# Patient Record
Sex: Male | Born: 1950 | Race: White | Hispanic: No | Marital: Single | Smoking: Current every day smoker
Health system: Southern US, Community
[De-identification: ages and names within clinical notes are randomized; demographics above are authoritative.]

## PROBLEM LIST (undated history)

## (undated) DIAGNOSIS — I739 Peripheral vascular disease, unspecified: Secondary | ICD-10-CM

## (undated) DIAGNOSIS — I10 Essential (primary) hypertension: Secondary | ICD-10-CM

## (undated) DIAGNOSIS — S2249XA Multiple fractures of ribs, unspecified side, initial encounter for closed fracture: Secondary | ICD-10-CM

## (undated) DIAGNOSIS — R55 Syncope and collapse: Secondary | ICD-10-CM

## (undated) DIAGNOSIS — K029 Dental caries, unspecified: Secondary | ICD-10-CM

## (undated) DIAGNOSIS — I480 Paroxysmal atrial fibrillation: Secondary | ICD-10-CM

## (undated) DIAGNOSIS — Z720 Tobacco use: Secondary | ICD-10-CM

## (undated) DIAGNOSIS — R911 Solitary pulmonary nodule: Secondary | ICD-10-CM

## (undated) DIAGNOSIS — R9389 Abnormal findings on diagnostic imaging of other specified body structures: Secondary | ICD-10-CM

## (undated) DIAGNOSIS — R569 Unspecified convulsions: Secondary | ICD-10-CM

## (undated) DIAGNOSIS — J329 Chronic sinusitis, unspecified: Secondary | ICD-10-CM

## (undated) HISTORY — DX: Peripheral vascular disease, unspecified: I73.9

## (undated) HISTORY — DX: Paroxysmal atrial fibrillation: I48.0

## (undated) HISTORY — DX: Essential (primary) hypertension: I10

---
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
## 2006-06-29 ENCOUNTER — Ambulatory Visit (HOSPITAL_COMMUNITY): Admission: RE | Admit: 2006-06-29 | Discharge: 2006-07-01 | Payer: Self-pay | Admitting: Urology

## 2016-06-09 ENCOUNTER — Encounter (HOSPITAL_COMMUNITY): Payer: Self-pay | Admitting: Emergency Medicine

## 2016-06-09 ENCOUNTER — Emergency Department (HOSPITAL_COMMUNITY): Payer: Medicaid Other

## 2016-06-09 ENCOUNTER — Inpatient Hospital Stay (HOSPITAL_COMMUNITY)
Admission: EM | Admit: 2016-06-09 | Discharge: 2016-06-15 | DRG: 243 | Disposition: A | Discharge status: Home / Self Care | Payer: Medicaid Other | Attending: General Surgery | Admitting: General Surgery

## 2016-06-09 DIAGNOSIS — S2241XA Multiple fractures of ribs, right side, initial encounter for closed fracture: Secondary | ICD-10-CM

## 2016-06-09 DIAGNOSIS — Z950 Presence of cardiac pacemaker: Secondary | ICD-10-CM

## 2016-06-09 DIAGNOSIS — W109XXA Fall (on) (from) unspecified stairs and steps, initial encounter: Secondary | ICD-10-CM | POA: Diagnosis present

## 2016-06-09 DIAGNOSIS — J0100 Acute maxillary sinusitis, unspecified: Secondary | ICD-10-CM

## 2016-06-09 DIAGNOSIS — I10 Essential (primary) hypertension: Secondary | ICD-10-CM | POA: Diagnosis present

## 2016-06-09 DIAGNOSIS — J329 Chronic sinusitis, unspecified: Secondary | ICD-10-CM | POA: Diagnosis present

## 2016-06-09 DIAGNOSIS — I495 Sick sinus syndrome: Principal | ICD-10-CM | POA: Diagnosis present

## 2016-06-09 DIAGNOSIS — D72829 Elevated white blood cell count, unspecified: Secondary | ICD-10-CM | POA: Diagnosis present

## 2016-06-09 DIAGNOSIS — F17210 Nicotine dependence, cigarettes, uncomplicated: Secondary | ICD-10-CM | POA: Diagnosis present

## 2016-06-09 DIAGNOSIS — K029 Dental caries, unspecified: Secondary | ICD-10-CM | POA: Diagnosis present

## 2016-06-09 DIAGNOSIS — R911 Solitary pulmonary nodule: Secondary | ICD-10-CM | POA: Diagnosis present

## 2016-06-09 DIAGNOSIS — I739 Peripheral vascular disease, unspecified: Secondary | ICD-10-CM | POA: Diagnosis not present

## 2016-06-09 DIAGNOSIS — Z720 Tobacco use: Secondary | ICD-10-CM

## 2016-06-09 DIAGNOSIS — I480 Paroxysmal atrial fibrillation: Secondary | ICD-10-CM | POA: Diagnosis present

## 2016-06-09 DIAGNOSIS — R938 Abnormal findings on diagnostic imaging of other specified body structures: Secondary | ICD-10-CM | POA: Diagnosis not present

## 2016-06-09 DIAGNOSIS — R55 Syncope and collapse: Secondary | ICD-10-CM | POA: Diagnosis present

## 2016-06-09 DIAGNOSIS — J449 Chronic obstructive pulmonary disease, unspecified: Secondary | ICD-10-CM | POA: Diagnosis present

## 2016-06-09 DIAGNOSIS — I472 Ventricular tachycardia: Secondary | ICD-10-CM | POA: Diagnosis not present

## 2016-06-09 DIAGNOSIS — W19XXXA Unspecified fall, initial encounter: Secondary | ICD-10-CM

## 2016-06-09 DIAGNOSIS — S299XXA Unspecified injury of thorax, initial encounter: Secondary | ICD-10-CM

## 2016-06-09 DIAGNOSIS — R9389 Abnormal findings on diagnostic imaging of other specified body structures: Secondary | ICD-10-CM | POA: Diagnosis present

## 2016-06-09 DIAGNOSIS — I459 Conduction disorder, unspecified: Secondary | ICD-10-CM | POA: Diagnosis not present

## 2016-06-09 HISTORY — DX: Multiple fractures of ribs, unspecified side, initial encounter for closed fracture: S22.49XA

## 2016-06-09 HISTORY — DX: Solitary pulmonary nodule: R91.1

## 2016-06-09 HISTORY — DX: Abnormal findings on diagnostic imaging of other specified body structures: R93.89

## 2016-06-09 HISTORY — DX: Syncope and collapse: R55

## 2016-06-09 HISTORY — DX: Chronic sinusitis, unspecified: J32.9

## 2016-06-09 HISTORY — DX: Dental caries, unspecified: K02.9

## 2016-06-09 HISTORY — DX: Tobacco use: Z72.0

## 2016-06-09 LAB — CBC
HCT: 42.9 % (ref 39.0–52.0)
HEMOGLOBIN: 14.7 g/dL (ref 13.0–17.0)
MCH: 33.3 pg (ref 26.0–34.0)
MCHC: 34.3 g/dL (ref 30.0–36.0)
MCV: 97.1 fL (ref 78.0–100.0)
Platelets: 172 10*3/uL (ref 150–400)
RBC: 4.42 MIL/uL (ref 4.22–5.81)
RDW: 13 % (ref 11.5–15.5)
WBC: 12.6 10*3/uL — ABNORMAL HIGH (ref 4.0–10.5)

## 2016-06-09 LAB — COMPREHENSIVE METABOLIC PANEL
ALBUMIN: 3.4 g/dL — AB (ref 3.5–5.0)
ALK PHOS: 89 U/L (ref 38–126)
ALT: 7 U/L — ABNORMAL LOW (ref 17–63)
AST: 19 U/L (ref 15–41)
Anion gap: 7 (ref 5–15)
BUN: 11 mg/dL (ref 6–20)
CHLORIDE: 102 mmol/L (ref 101–111)
CO2: 25 mmol/L (ref 22–32)
CREATININE: 0.97 mg/dL (ref 0.61–1.24)
Calcium: 8.8 mg/dL — ABNORMAL LOW (ref 8.9–10.3)
GFR calc Af Amer: >60 mL/min (ref >60)
GFR calc non Af Amer: >60 mL/min (ref >60)
Glucose, Bld: 91 mg/dL (ref 65–99)
Potassium: 4 mmol/L (ref 3.5–5.1)
Sodium: 134 mmol/L — ABNORMAL LOW (ref 135–145)
Total Bilirubin: 0.9 mg/dL (ref 0.3–1.2)
Total Protein: 6.3 g/dL — ABNORMAL LOW (ref 6.5–8.1)

## 2016-06-09 LAB — TROPONIN I

## 2016-06-09 LAB — I-STAT TROPONIN, ED
Troponin i, poc: 0 ng/mL (ref 0.00–0.08)
Troponin i, poc: 0.01 ng/mL (ref 0.00–0.08)
Troponin i, poc: 0 ng/mL (ref 0.00–0.08)

## 2016-06-09 LAB — MRSA PCR SCREENING: MRSA by PCR: NEGATIVE

## 2016-06-09 LAB — TSH: TSH: 0.506 u[IU]/mL (ref 0.350–4.500)

## 2016-06-09 MED ORDER — IPRATROPIUM-ALBUTEROL 0.5-2.5 (3) MG/3ML IN SOLN
3.0000 mL | Freq: Four times a day (QID) | RESPIRATORY_TRACT | Status: DC
  Administered 2016-06-10 (×2): 3 mL via RESPIRATORY_TRACT
  Filled 2016-06-09 (×2): qty 3

## 2016-06-09 MED ORDER — HYDRALAZINE HCL 20 MG/ML IJ SOLN: 5.0000 mg | INTRAMUSCULAR | Status: DC | PRN

## 2016-06-09 MED ORDER — OXYCODONE HCL 5 MG PO TABS
5.0000 mg | ORAL_TABLET | ORAL | Status: DC | PRN
  Administered 2016-06-09: 10 mg via ORAL
  Administered 2016-06-10: 15 mg via ORAL
  Administered 2016-06-10 – 2016-06-11 (×3): 10 mg via ORAL
  Filled 2016-06-09 (×5): qty 2
  Filled 2016-06-09: qty 3

## 2016-06-09 MED ORDER — ENOXAPARIN SODIUM 40 MG/0.4ML ~~LOC~~ SOLN
40.0000 mg | SUBCUTANEOUS | Status: DC
  Administered 2016-06-10 – 2016-06-13 (×4): 40 mg via SUBCUTANEOUS
  Filled 2016-06-09 (×4): qty 0.4

## 2016-06-09 MED ORDER — METOPROLOL TARTRATE 5 MG/5ML IV SOLN
10.0000 mg | Freq: Once | INTRAVENOUS | Status: AC
  Administered 2016-06-09: 10 mg via INTRAVENOUS
  Filled 2016-06-09: qty 10

## 2016-06-09 MED ORDER — ONDANSETRON HCL 4 MG/2ML IJ SOLN: 4.0000 mg | Freq: Four times a day (QID) | INTRAMUSCULAR | Status: DC | PRN

## 2016-06-09 MED ORDER — HYDROMORPHONE HCL 1 MG/ML IJ SOLN
1.0000 mg | INTRAMUSCULAR | Status: DC | PRN
  Administered 2016-06-09 (×2): 1 mg via INTRAVENOUS
  Filled 2016-06-09 (×3): qty 1

## 2016-06-09 MED ORDER — ACETAMINOPHEN 325 MG PO TABS: 650.0000 mg | ORAL_TABLET | ORAL | Status: DC | PRN

## 2016-06-09 MED ORDER — METHOCARBAMOL 1000 MG/10ML IJ SOLN
1000.0000 mg | Freq: Three times a day (TID) | INTRAVENOUS | Status: DC | PRN
  Filled 2016-06-09: qty 10

## 2016-06-09 MED ORDER — PANTOPRAZOLE SODIUM 40 MG IV SOLR: 40.0000 mg | Freq: Every day | INTRAVENOUS | Status: DC

## 2016-06-09 MED ORDER — KCL IN DEXTROSE-NACL 20-5-0.45 MEQ/L-%-% IV SOLN
INTRAVENOUS | Status: DC
  Administered 2016-06-09 – 2016-06-14 (×5): via INTRAVENOUS
  Filled 2016-06-09 (×7): qty 1000

## 2016-06-09 MED ORDER — IOPAMIDOL (ISOVUE-300) INJECTION 61%
INTRAVENOUS | Status: AC
  Administered 2016-06-09: 100 mL
  Filled 2016-06-09: qty 100

## 2016-06-09 MED ORDER — MORPHINE SULFATE (PF) 4 MG/ML IV SOLN
4.0000 mg | Freq: Once | INTRAVENOUS | Status: AC
  Administered 2016-06-09: 4 mg via INTRAVENOUS
  Filled 2016-06-09: qty 1

## 2016-06-09 MED ORDER — AMOXICILLIN-POT CLAVULANATE 875-125 MG PO TABS
1.0000 | ORAL_TABLET | Freq: Two times a day (BID) | ORAL | Status: DC
  Administered 2016-06-09 – 2016-06-15 (×13): 1 via ORAL
  Filled 2016-06-09 (×13): qty 1

## 2016-06-09 MED ORDER — TETANUS-DIPHTH-ACELL PERTUSSIS 5-2.5-18.5 LF-MCG/0.5 IM SUSP
0.5000 mL | Freq: Once | INTRAMUSCULAR | Status: AC
  Administered 2016-06-09: 0.5 mL via INTRAMUSCULAR
  Filled 2016-06-09: qty 0.5

## 2016-06-09 MED ORDER — SODIUM CHLORIDE 0.9% FLUSH
3.0000 mL | Freq: Two times a day (BID) | INTRAVENOUS | Status: DC
  Administered 2016-06-09 – 2016-06-14 (×8): 3 mL via INTRAVENOUS

## 2016-06-09 MED ORDER — PANTOPRAZOLE SODIUM 40 MG PO TBEC
40.0000 mg | DELAYED_RELEASE_TABLET | Freq: Every day | ORAL | Status: DC
  Administered 2016-06-09 – 2016-06-15 (×7): 40 mg via ORAL
  Filled 2016-06-09 (×7): qty 1

## 2016-06-09 MED ORDER — ONDANSETRON HCL 4 MG PO TABS
4.0000 mg | ORAL_TABLET | Freq: Four times a day (QID) | ORAL | Status: DC | PRN
  Administered 2016-06-09: 4 mg via ORAL
  Filled 2016-06-09: qty 1

## 2016-06-09 NOTE — ED Notes (Signed)
This Rn spoke with Dr Janee Morn and he gave verbal order to give  of lopressor

## 2016-06-09 NOTE — ED Triage Notes (Signed)
Pt here after passing out at the top of his steps and falling down approx 12 steps , positive loc , pt has pain to the right rear flank , aslo swelling to the nose

## 2016-06-09 NOTE — ED Notes (Signed)
Dr Janee Morn paged to inform of atrial fibrillation

## 2016-06-09 NOTE — Consult Note (Signed)
[facility] Medical Consultation  MOHID FURUYA ZOX:[id number] DOB: January 30, 1951 DOA: 06/09/2016 PCP: No PCP Per Patient   Requesting physician: Laurell Josephs trauma Date of consultation: 06/09/16 Reason for consultation: syncope  Impression/Recommendations Principal Problem:   Syncope Active Problems:   Multiple fractures of ribs, right side, initial encounter for closed fracture   Sinusitis   Pulmonary nodule   Tobacco use   Abnormal CT of the chest   Dental caries  1. Syncope. Etiology uncertain. Ct of the head no acute abnormality. EKG with normal sinus rhythm. Maxillary CT reveals a sinusitis. Troponin negative. No metabolic derangements. She denies taking any over-the-counter drugs. -Monitor on telemetry for any arrhythmias -Obtain a 2-D echo -Obtain urinalysis -Urine drug screen -TSH -Obtain a CK -orthostatic vital signs  #2. Sinusitis. Per CT as noted above. Patient denies headache fever chills. Mild leukocytosis. Patient is afebrile and nontoxic appearing -augmenten  #3. Pulmonary nodule right lung per chest CT. Patient long-term smoking history. Oxygen saturation level greater than 90% on room air. Suspect patient has some underlying COPD but he does not have PCP -Outpatient follow-up 6-12 months -Case management to assist with establishing PCP  #4. Abnormal chest CT. ET of the chest with emphysematous changes. Long term smoker.  -smoking cesation -OP follow up  5. Tobacco use. -Cessation counseling  6.dental caries. No dental care -case management assist with dental follow up  #7. Multiple fractures of ribs right side -Per trauma team   TRH followup again tomorrow. Thank you for this consultation.  Chief Complaint: syncope  HPI:  Patrick Kelly is a very pleasant 66 year old with no medical history according to him presents to emergency department after a fall down 14 concrete steps really is result of syncopal episode. Patient remembers getting ready for  work taken his coffee in his apartment standing at the top of the stairs. His next memory is waking up at the bottom of the stairs noticing blood. He believes he was lying there for almost an hour. He noticed his cell phone was lying midway up the stairs and was able to crawl to his cell phone. He reports being in his usual state of health prior to the fall. He denies chest pain palpitations shortness of breath lower extremity edema or orthopnea. He denies any recent illness fever chills headache dizziness visual disturbances. He denies any cough nasal congestion shortness of breath abdominal pain nausea vomiting diarrhea. He denies melena bright red blood per rectum. He reports eating and drinking his normal amount and denies any unintentional weight loss.  Review of Systems:  Point review of systems complete and all systems are negative except as indicated in the history of present illness  Past Medical History:  Diagnosis Date  . Abnormal CT of the chest    emphysemetous changes suggestive copd  . Dental caries   . Multiple fractures of ribs   . Pulmonary nodule   . Sinusitis   . Syncope   . Tobacco use    History reviewed. No pertinent surgical history. Social History:  reports that he has been smoking.  He has never used smokeless tobacco. He reports that he does not drink alcohol or use drugs.  No Known Allergies Family History  Problem Relation Age of Onset  . Hypertension Sister     Patient has a sister is at the bedside reports family medical history of hypertension. Unable to provide further family medical history at this time Prior to Admission medications   Not on File  Physical Exam: Blood pressure 115/79, pulse 80, temperature 97.6 F (36.4 C), temperature source Oral, resp. rate (!) 21, SpO2 96 %. Vitals:   06/09/16 1545 06/09/16 1600  BP: 128/84 115/79  Pulse: 79 80  Resp: (!) 23 (!) 21  Temp:       General:  Lying in bed well-nourished contusions on forehead  nose bilateral hands arms  Eyes: Pupils equal round reactive to light EOMI no scleral icterus  ENT: Ears clear nose without drainage oropharynx without erythema or exudate. Very poor dentition  Neck: Supple no JVD full range of motion no lymphadenopathy  Cardiovascular: Rate and rhythm no murmur gallop or rub no lower extremity edema chest tender to palpation particularly on the right  Respiratory: Normal effort breath sounds are distant slightly coarse a hear no wheeze no crackles  Abdomen: Nondistended nontender positive bowel sounds no guarding or rebounding  Skin: Abrasions noted, right dorsal MCP joint right hand left wrist nose for head  Musculoskeletal: Joints without swelling/erythema. Decreased range of motion to thoracic spine normal muscle tone  Psychiatric: Calm cooperative  Neurologic: Alert and oriented 3 speech clear facial symmetry  Labs on Admission:  Basic Metabolic Panel:  Recent Labs Lab 06/09/16 1156  NA 134*  K 4.0  CL 102  CO2 25  GLUCOSE 91  BUN 11  CREATININE 0.97  CALCIUM 8.8*   Liver Function Tests:  Recent Labs Lab 06/09/16 1156  AST 19  ALT 7*  ALKPHOS 89  BILITOT 0.9  PROT 6.3*  ALBUMIN 3.4*   No results for input(s): LIPASE, AMYLASE in the last 168 hours. No results for input(s): AMMONIA in the last 168 hours. CBC:  Recent Labs Lab 06/09/16 1156  WBC 12.6*  HGB 14.7  HCT 42.9  MCV 97.1  PLT 172   Cardiac Enzymes: No results for input(s): CKTOTAL, CKMB, CKMBINDEX, TROPONINI in the last 168 hours. BNP: Invalid input(s): POCBNP CBG: No results for input(s): GLUCAP in the last 168 hours.  Radiological Exams on Admission: Ct Head Wo Contrast  Result Date: 06/09/2016 CLINICAL DATA:  Fall down stairs. Forehead abrasion, bloody nose, missing teeth. Initial encounter. EXAM: CT HEAD WITHOUT CONTRAST CT MAXILLOFACIAL WITHOUT CONTRAST CT CERVICAL SPINE WITHOUT CONTRAST TECHNIQUE: Multidetector CT imaging of the head, cervical  spine, and maxillofacial structures were performed using the standard protocol without intravenous contrast. Multiplanar CT image reconstructions of the cervical spine and maxillofacial structures were also generated. COMPARISON:  None. FINDINGS: CT HEAD FINDINGS Brain: No evidence of acute infarction, hemorrhage, hydrocephalus, extra-axial collection or mass lesion/mass effect. Vascular: No hyperdense vessel or unexpected calcification. Skull: Normal. Negative for fracture or focal lesion. CT MAXILLOFACIAL FINDINGS Osseous: Remote left superior orbital rim repair. No acute fracture. Very poor dentition with multiple devitalized teeth and periapical erosions. There is extensive loss of the alveolar ridge involving the mandible. Orbits: No posttraumatic finding. Sinuses: Fluid levels in the right frontal sinus. Both maxillary sinuses are atelectatic and partially opacified. Soft tissues: No opaque foreign body or hematoma. CT CERVICAL SPINE FINDINGS Alignment: No traumatic malalignment. Skull base and vertebrae: Negative for acute fracture. Soft tissues and spinal canal: No prevertebral fluid or swelling. No visible canal hematoma. Disc levels: C5-6 and C6-7 uncovertebral spurring and foraminal narrowing. Upper chest: Biapical emphysema. IMPRESSION: 1. No evidence of intracranial or cervical spine injury. 2. Negative for facial fracture. 3. 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 A superimposed tooth fracture would not be readily detectable. 4. Sinusitis. Electronically Signed   By: Marja Kays  Watts M.D.   On: 06/09/2016 13:54   Ct Chest W Contrast  Result Date: 06/09/2016 CLINICAL DATA:  Larey Seat down stairs. EXAM: CT CHEST, ABDOMEN, AND PELVIS WITH CONTRAST TECHNIQUE: Multidetector CT imaging of the chest, abdomen and pelvis was performed following the standard protocol during bolus administration of intravenous contrast. CONTRAST:  ISOVUE-300 IOPAMIDOL (ISOVUE-300) INJECTION 61%  COMPARISON:  None. FINDINGS: CT CHEST FINDINGS Cardiovascular: Normal heart size. No pericardial effusion. The thoracic aorta appears intact. Mediastinum/Nodes: The trachea appears patent and is midline. Normal appearance of the esophagus. Prominent mediastinal lymph nodes identified. Left paratracheal node measures 8 mm. 1.3 cm right hilar lymph node is identified. The 1 cm sub- carinal lymph node identified. Lungs/Pleura: No pleural effusion. Moderate to advanced changes of centrilobular and paraseptal emphysema. No pneumothorax or pulmonary contusion. 6 mm anteromedial right upper lobe lung nodule is identified, image 40 of series 203. Dependent changes and subsegmental atelectasis noted in the lung bases. Musculoskeletal: There are acute nondisplaced right sixth through tenth rib fractures. No acute left rib fractures. The sternum appears intact. There is a fracture deformity involving the T4 vertebra nondisplaced scratch set mild nondisplaced fracture involving the inferior endplate of T4 is suspected. CT ABDOMEN PELVIS FINDINGS Hepatobiliary: Hemangioma within the posterior right lobe of liver measures 3.6 cm. Within segment 6 there is a larger hemangioma measuring 3.6 cm. The liver is otherwise intact without evidence for rupture. Stones identified within the neck of gallbladder. No biliary dilatation. Pancreas: Unremarkable. No pancreatic ductal dilatation or surrounding inflammatory changes. Spleen: Normal in size without focal abnormality. Adrenals/Urinary Tract: Adreniform enlargement of bilateral adrenal glands. No hemorrhage. The kidneys appear intact. Right kidney cyst noted. Several cystic structures are identified within the left side of pelvis. Favor large bladder diverticula. Urinary bladder otherwise appears normal. Stomach/Bowel: The stomach is normal. The small bowel loops have a normal course and caliber. The appendix is visualized and appears normal. No pathologic dilatation of the colon.  Vascular/Lymphatic: Aortic atherosclerosis. No upper abdominal adenopathy. No pelvic or inguinal adenopathy. Reproductive:   Prostate gland is enlarged. Other: No free fluid or fluid collections. Musculoskeletal: Sclerotic lesion in the right iliac wing is favored to represent a benign bone island. 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 IMPRESSION: 1. Multiple acute right rib fracture deformities. There is also an age indeterminate T4 fracture deformity. 2. No evidence for solid organ injury within the abdomen or pelvis. 3. Diffuse bronchial wall thickening with emphysema, as above; imaging findings suggestive of underlying COPD. 4. Pulmonary nodule in the right lung measures 6 mm. Non-contrast chest CT at 6-12 months is recommended. If the nodule is stable at time of repeat CT, then future CT at 18-24 months (from today's scan) is considered optional for low-risk patients, but is recommended for high-risk patients. This recommendation follows the consensus statement: Guidelines for Management of Incidental Pulmonary Nodules Detected on CT Images:From the Fleischner Society 2017; published online before print (10.1148/radiol.[id number]). 5. Liver hemangiomas. 6. Gallstones 7. Prostate gland enlargement and bladder diverticula. Electronically Signed   By: Signa Kell M.D.   On: 06/09/2016 13:53   Ct Cervical Spine Wo Contrast  Result Date: 06/09/2016 CLINICAL DATA:  Fall down stairs. Forehead abrasion, bloody nose, missing teeth. Initial encounter. EXAM: CT HEAD WITHOUT CONTRAST CT MAXILLOFACIAL WITHOUT CONTRAST CT CERVICAL SPINE WITHOUT CONTRAST TECHNIQUE: Multidetector CT imaging of the head, cervical spine, and maxillofacial structures were performed using the standard protocol without intravenous contrast.  Multiplanar CT image reconstructions of the cervical spine and maxillofacial structures were also generated. COMPARISON:  None. FINDINGS: CT  HEAD FINDINGS Brain: No evidence of acute infarction, hemorrhage, hydrocephalus, extra-axial collection or mass lesion/mass effect. Vascular: No hyperdense vessel or unexpected calcification. Skull: Normal. Negative for fracture or focal lesion. CT MAXILLOFACIAL FINDINGS Osseous: Remote left superior orbital rim repair. No acute fracture. Very poor dentition with multiple devitalized teeth and periapical erosions. There is extensive loss of the alveolar ridge involving the mandible. Orbits: No posttraumatic finding. Sinuses: Fluid levels in the right frontal sinus. Both maxillary sinuses are atelectatic and partially opacified. Soft tissues: No opaque foreign body or hematoma. CT CERVICAL SPINE FINDINGS Alignment: No traumatic malalignment. Skull base and vertebrae: Negative for acute fracture. Soft tissues and spinal canal: No prevertebral fluid or swelling. No visible canal hematoma. Disc levels: C5-6 and C6-7 uncovertebral spurring and foraminal narrowing. Upper chest: Biapical emphysema. IMPRESSION: 1. No evidence of intracranial or cervical spine injury. 2. Negative for facial fracture. 3. 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 A superimposed tooth fracture would not be readily detectable. 4. Sinusitis. Electronically Signed   By: Marnee Spring M.D.   On: 06/09/2016 13:54   Ct Abdomen Pelvis W Contrast  Result Date: 06/09/2016 CLINICAL DATA:  Larey Seat down stairs. EXAM: CT CHEST, ABDOMEN, AND PELVIS WITH CONTRAST TECHNIQUE: Multidetector CT imaging of the chest, abdomen and pelvis was performed following the standard protocol during bolus administration of intravenous contrast. CONTRAST:  ISOVUE-300 IOPAMIDOL (ISOVUE-300) INJECTION 61% COMPARISON:  None. FINDINGS: CT CHEST FINDINGS Cardiovascular: Normal heart size. No pericardial effusion. The thoracic aorta appears intact. Mediastinum/Nodes: The trachea appears patent and is midline. Normal appearance of the esophagus. Prominent  mediastinal lymph nodes identified. Left paratracheal node measures 8 mm. 1.3 cm right hilar lymph node is identified. The 1 cm sub- carinal lymph node identified. Lungs/Pleura: No pleural effusion. Moderate to advanced changes of centrilobular and paraseptal emphysema. No pneumothorax or pulmonary contusion. 6 mm anteromedial right upper lobe lung nodule is identified, image 40 of series 203. Dependent changes and subsegmental atelectasis noted in the lung bases. Musculoskeletal: There are acute nondisplaced right sixth through tenth rib fractures. No acute left rib fractures. The sternum appears intact. There is a fracture deformity involving the T4 vertebra nondisplaced scratch set mild nondisplaced fracture involving the inferior endplate of T4 is suspected. CT ABDOMEN PELVIS FINDINGS Hepatobiliary: Hemangioma within the posterior right lobe of liver measures 3.6 cm. Within segment 6 there is a larger hemangioma measuring 3.6 cm. The liver is otherwise intact without evidence for rupture. Stones identified within the neck of gallbladder. No biliary dilatation. Pancreas: Unremarkable. No pancreatic ductal dilatation or surrounding inflammatory changes. Spleen: Normal in size without focal abnormality. Adrenals/Urinary Tract: Adreniform enlargement of bilateral adrenal glands. No hemorrhage. The kidneys appear intact. Right kidney cyst noted. Several cystic structures are identified within the left side of pelvis. Favor large bladder diverticula. Urinary bladder otherwise appears normal. Stomach/Bowel: The stomach is normal. The small bowel loops have a normal course and caliber. The appendix is visualized and appears normal. No pathologic dilatation of the colon. Vascular/Lymphatic: Aortic atherosclerosis. No upper abdominal adenopathy. No pelvic or inguinal adenopathy. Reproductive:   Prostate gland is enlarged. Other: No free fluid or fluid collections. Musculoskeletal: Sclerotic lesion in the right iliac wing  is favored to represent a benign bone island. 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 IMPRESSION: 1. Multiple acute right  rib fracture deformities. There is also an age indeterminate T4 fracture deformity. 2. No evidence for solid organ injury within the abdomen or pelvis. 3. Diffuse bronchial wall thickening with emphysema, as above; imaging findings suggestive of underlying COPD. 4. Pulmonary nodule in the right lung measures 6 mm. Non-contrast chest CT at 6-12 months is recommended. If the nodule is stable at time of repeat CT, then future CT at 18-24 months (from today's scan) is considered optional for low-risk patients, but is recommended for high-risk patients. This recommendation follows the consensus statement: Guidelines for Management of Incidental Pulmonary Nodules Detected on CT Images:From the Fleischner Society 2017; published online before print (10.1148/radiol.[id number]). 5. Liver hemangiomas. 6. Gallstones 7. Prostate gland enlargement and bladder diverticula. Electronically Signed   By: Signa Kell M.D.   On: 06/09/2016 13:53   Ct Maxillofacial Wo Contrast  Result Date: 06/09/2016 CLINICAL DATA:  Fall down stairs. Forehead abrasion, bloody nose, missing teeth. Initial encounter. EXAM: CT HEAD WITHOUT CONTRAST CT MAXILLOFACIAL WITHOUT CONTRAST CT CERVICAL SPINE WITHOUT CONTRAST TECHNIQUE: Multidetector CT imaging of the head, cervical spine, and maxillofacial structures were performed using the standard protocol without intravenous contrast. Multiplanar CT image reconstructions of the cervical spine and maxillofacial structures were also generated. COMPARISON:  None. FINDINGS: CT HEAD FINDINGS Brain: No evidence of acute infarction, hemorrhage, hydrocephalus, extra-axial collection or mass lesion/mass effect. Vascular: No hyperdense vessel or unexpected calcification. Skull: Normal. Negative for fracture or focal lesion. CT  MAXILLOFACIAL FINDINGS Osseous: Remote left superior orbital rim repair. No acute fracture. Very poor dentition with multiple devitalized teeth and periapical erosions. There is extensive loss of the alveolar ridge involving the mandible. Orbits: No posttraumatic finding. Sinuses: Fluid levels in the right frontal sinus. Both maxillary sinuses are atelectatic and partially opacified. Soft tissues: No opaque foreign body or hematoma. CT CERVICAL SPINE FINDINGS Alignment: No traumatic malalignment. Skull base and vertebrae: Negative for acute fracture. Soft tissues and spinal canal: No prevertebral fluid or swelling. No visible canal hematoma. Disc levels: C5-6 and C6-7 uncovertebral spurring and foraminal narrowing. Upper chest: Biapical emphysema. IMPRESSION: 1. No evidence of intracranial or cervical spine injury. 2. Negative for facial fracture. 3. 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 A superimposed tooth fracture would not be readily detectable. 4. Sinusitis. Electronically Signed   By: Marnee Spring M.D.   On: 06/09/2016 13:54    EKG: Independently reviewed. Sinus rhythm Borderline ST elevation, inferior leads  Time spent: 55 minutes  [place] M [facility]   If 7PM-7AM, please contact night-coverage [email or web address] Password [email or web address] 06/09/2016, 4:31 PM

## 2016-06-09 NOTE — H&P (Signed)
[facility] Surgery Admission Note  Patrick Kelly 07-09-1950  [id number].    Requesting MD:  Chief Complaint/Reason for Consult: Fractured ribs after fall   HPI:   Patrick Kelly is a 66 year old male with no significant PMHx pack a day smoker who was brought to the ED via EMS following a possible syncopal episode resulting in a fall down the 13 concrete stairs of his apartment. His sister is present throughout the interview.  The patient states that he fell after exiting his apartment but before he was able to lock the deadbolt on his door. He notes that he does not remember the fall and did suffer a LOC. He awoke at the bottom of the stairs roughly 1 hour later and proceeded to crawl half way up the stairs to his phone at which time his coworker called inquiring his whereabouts. His coworkers and EMS arrived on the scene and proceeded to bring him to the [facility] ED. The patient states that when he awoke he was covered in blood. He states that he was in extreme pain on the right side of his chest since he woke. Pain is 2/10 at rest and severe and worse with movement, non radiating, sharp.   The patient states that he was having a normal morning, did not feel dizzy, weak, numbness, tingling, SOB, or notice a headache before the event. He did not eat breakfast, but states that this is not different from any other morning. He states that he does not have a history of falling and has been very healthy prior to the fall. He does not take any medications. Denies any cardiac history.  ROS: Review of Systems  Respiratory: Negative for shortness of breath.   Cardiovascular: Negative for chest pain.  Gastrointestinal: Negative for diarrhea, nausea and vomiting.  Musculoskeletal: Positive for falls. Negative for back pain and neck pain.       Positive for right rib pain  Neurological: Positive for loss of consciousness. Negative for dizziness, tingling and headaches.  Psychiatric/Behavioral:  Negative for memory loss.  All other systems reviewed and are negative.   History reviewed. Father died in 32's from MI and brother has heart disease  History reviewed. No pertinent past medical history.  History reviewed. No pertinent surgical history.  Social History:  reports that he has been smoking.  He has never used smokeless tobacco. He reports that he does not drink alcohol or use drugs.  Allergies: No Known Allergies   (Not in a hospital admission)  Blood pressure 112/90, pulse 89, temperature 97.6 F (36.4 C), temperature source Oral, resp. rate 20, SpO2 94 % on 2L Physical Exam: Physical Exam  Constitutional: He is oriented to person, place, and time. He appears well-developed and well-nourished. He is cooperative.  in moderate discomfort, laying flat in ED bed  HENT:  Head: Normocephalic. Head is with abrasion (on mid upper forehead).    Nose: Nose normal. No sinus tenderness or nasal deformity.  Mouth/Throat: Mucous membranes are not pale and dry. Abnormal dentition. Lacerations and dental caries present.  Dried blood, no signs of trauma Mouth: Laceration located on internal upper lip, poor dentition.  Eyes: Conjunctivae are normal. Pupils are equal, round, and reactive to light.  Neck: Normal range of motion and full passive range of motion without pain. No spinous process tenderness and no muscular tenderness present. No tracheal deviation and normal range of motion present.  Cardiovascular: Normal rate, regular rhythm and intact distal pulses.  Exam reveals  no gallop and no friction rub.   No murmur heard. Pulses:      Radial pulses are 2+ on the right side, and 2+ on the left side.       Dorsalis pedis pulses are 2+ on the right side, and 2+ on the left side.  Pulmonary/Chest: Effort normal and breath sounds normal. No respiratory distress. He has no decreased breath sounds. He has no wheezes. He has no rales. He exhibits tenderness.  Abdominal: Soft. Normal  appearance and bowel sounds are normal. There is no hepatosplenomegaly. There is no tenderness. No hernia.  Musculoskeletal: Normal range of motion. He exhibits no edema, tenderness or deformity.  No TTP to spinous processes or paraspinal muscles or C, T and L spine  Abrasion noted to right dorsal MCP joint, and multiple abrasions noted to dorsal aspect of left hand  Neurological: He is alert and oriented to person, place, and time. No cranial nerve deficit. GCS eye subscore is 4. GCS verbal subscore is 5. GCS motor subscore is 6.  Skin: Skin is warm and dry.  No ecchymosis noted over right ribs.  Psychiatric: He has a normal mood and affect. His speech is normal and behavior is normal.    Results for orders placed or performed during the hospital encounter of 06/09/16 (from the past 48 hour(s))  CBC     Status: Abnormal   Collection Time: 06/09/16 11:56 AM  Result Value Ref Range   WBC 12.6 (H) 4.0 - 10.5 K/uL   RBC 4.42 4.22 - 5.81 MIL/uL   Hemoglobin 14.7 13.0 - 17.0 g/dL   HCT 42.9 39.0 - 52.0 %   MCV 97.1 78.0 - 100.0 fL   MCH 33.3 26.0 - 34.0 pg   MCHC 34.3 30.0 - 36.0 g/dL   RDW 13.0 11.5 - 15.5 %   Platelets 172 150 - 400 K/uL  Comprehensive metabolic panel     Status: Abnormal   Collection Time: 06/09/16 11:56 AM  Result Value Ref Range   Sodium 134 (L) 135 - 145 mmol/L   Potassium 4.0 3.5 - 5.1 mmol/L   Chloride 102 101 - 111 mmol/L   CO2 25 22 - 32 mmol/L   Glucose, Bld 91 65 - 99 mg/dL   BUN 11 6 - 20 mg/dL   Creatinine, Ser 0.97 0.61 - 1.24 mg/dL   Calcium 8.8 (L) 8.9 - 10.3 mg/dL   Total Protein 6.3 (L) 6.5 - 8.1 g/dL   Albumin 3.4 (L) 3.5 - 5.0 g/dL   AST 19 15 - 41 U/L   ALT 7 (L) 17 - 63 U/L   Alkaline Phosphatase 89 38 - 126 U/L   Total Bilirubin 0.9 0.3 - 1.2 mg/dL   GFR calc non Af Amer >60 >60 mL/min   GFR calc Af Amer >60 >60 mL/min    Comment: (NOTE) The eGFR has been calculated using the CKD EPI equation. This calculation has not been validated in  all clinical situations. eGFR's persistently <60 mL/min signify possible Chronic Kidney Disease.    Anion gap 7 5 - 15  I-Stat Troponin, ED - 0, 3, 6 hours (not at [facility])     Status: None   Collection Time: 06/09/16 12:15 PM  Result Value Ref Range   Troponin i, poc 0.00 0.00 - 0.08 ng/mL   Comment 3            Comment: Due to the release kinetics of cTnI, a negative result within the first hours of  the onset of symptoms does not rule out myocardial infarction with certainty. If myocardial infarction is still suspected, repeat the test at appropriate intervals.    Ct Head Wo Contrast  Result Date: 06/09/2016 CLINICAL DATA:  Fall down stairs. Forehead abrasion, bloody nose, missing teeth. Initial encounter. EXAM: CT HEAD WITHOUT CONTRAST CT MAXILLOFACIAL WITHOUT CONTRAST CT CERVICAL SPINE WITHOUT CONTRAST TECHNIQUE: Multidetector CT imaging of the head, cervical spine, and maxillofacial structures were performed using the standard protocol without intravenous contrast. Multiplanar CT image reconstructions of the cervical spine and maxillofacial structures were also generated. COMPARISON:  None. FINDINGS: CT HEAD FINDINGS Brain: No evidence of acute infarction, hemorrhage, hydrocephalus, extra-axial collection or mass lesion/mass effect. Vascular: No hyperdense vessel or unexpected calcification. Skull: Normal. Negative for fracture or focal lesion. CT MAXILLOFACIAL FINDINGS Osseous: Remote left superior orbital rim repair. No acute fracture. Very poor dentition with multiple devitalized teeth and periapical erosions. There is extensive loss of the alveolar ridge involving the mandible. Orbits: No posttraumatic finding. Sinuses: Fluid levels in the right frontal sinus. Both maxillary sinuses are atelectatic and partially opacified. Soft tissues: No opaque foreign body or hematoma. CT CERVICAL SPINE FINDINGS Alignment: No traumatic malalignment. Skull base and vertebrae: Negative for acute fracture.  Soft tissues and spinal canal: No prevertebral fluid or swelling. No visible canal hematoma. Disc levels: C5-6 and C6-7 uncovertebral spurring and foraminal narrowing. Upper chest: Biapical emphysema. IMPRESSION: 1. No evidence of intracranial or cervical spine injury. 2. Negative for facial fracture. 3. 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 A superimposed tooth fracture would not be readily detectable. 4. Sinusitis. Electronically Signed   By: Monte Fantasia M.D.   On: 06/09/2016 13:54   Ct Chest W Contrast  Result Date: 06/09/2016 CLINICAL DATA:  [place] down stairs. EXAM: CT CHEST, ABDOMEN, AND PELVIS WITH CONTRAST TECHNIQUE: Multidetector CT imaging of the chest, abdomen and pelvis was performed following the standard protocol during bolus administration of intravenous contrast. CONTRAST:  164m ISOVUE-300 IOPAMIDOL (ISOVUE-300) INJECTION 61% COMPARISON:  None. FINDINGS: CT CHEST FINDINGS Cardiovascular: Normal heart size. No pericardial effusion. The thoracic aorta appears intact. Mediastinum/Nodes: The trachea appears patent and is midline. Normal appearance of the esophagus. Prominent mediastinal lymph nodes identified. Left paratracheal node measures 8 mm. 1.3 cm right hilar lymph node is identified. The 1 cm sub- carinal lymph node identified. Lungs/Pleura: No pleural effusion. Moderate to advanced changes of centrilobular and paraseptal emphysema. No pneumothorax or pulmonary contusion. 6 mm anteromedial right upper lobe lung nodule is identified, image 40 of series 203. Dependent changes and subsegmental atelectasis noted in the lung bases. Musculoskeletal: There are acute nondisplaced right sixth through tenth rib fractures. No acute left rib fractures. The sternum appears intact. There is a fracture deformity involving the T4 vertebra nondisplaced scratch set mild nondisplaced fracture involving the inferior endplate of T4 is suspected. CT ABDOMEN PELVIS FINDINGS Hepatobiliary:  Hemangioma within the posterior right lobe of liver measures 3.6 cm. Within segment 6 there is a larger hemangioma measuring 3.6 cm. The liver is otherwise intact without evidence for rupture. Stones identified within the neck of gallbladder. No biliary dilatation. Pancreas: Unremarkable. No pancreatic ductal dilatation or surrounding inflammatory changes. Spleen: Normal in size without focal abnormality. Adrenals/Urinary Tract: Adreniform enlargement of bilateral adrenal glands. No hemorrhage. The kidneys appear intact. Right kidney cyst noted. Several cystic structures are identified within the left side of pelvis. Favor large bladder diverticula. Urinary bladder otherwise appears normal. Stomach/Bowel: The stomach is normal. The small bowel loops  have a normal course and caliber. The appendix is visualized and appears normal. No pathologic dilatation of the colon. Vascular/Lymphatic: Aortic atherosclerosis. No upper abdominal adenopathy. No pelvic or inguinal adenopathy. Reproductive:   Prostate gland is enlarged. Other: No free fluid or fluid collections. Musculoskeletal: Sclerotic lesion in the right iliac wing is favored to represent a benign bone island. 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 IMPRESSION: 1. Multiple acute right rib fracture deformities. There is also an age indeterminate T4 fracture deformity. 2. No evidence for solid organ injury within the abdomen or pelvis. 3. Diffuse bronchial wall thickening with emphysema, as above; imaging findings suggestive of underlying COPD. 4. Pulmonary nodule in the right lung measures 6 mm. Non-contrast chest CT at 6-12 months is recommended. If the nodule is stable at time of repeat CT, then future CT at 18-24 months (from today's scan) is considered optional for low-risk patients, but is recommended for high-risk patients. This recommendation follows the consensus statement: Guidelines for  Management of Incidental Pulmonary Nodules Detected on CT Images:From the Fleischner Society 2017; published online before print (10.1148/[id number]). 5. Liver hemangiomas. 6. Gallstones 7. Prostate gland enlargement and bladder diverticula. Electronically Signed   By: Kerby Moors M.D.   On: 06/09/2016 13:53   Ct Cervical Spine Wo Contrast  Result Date: 06/09/2016 CLINICAL DATA:  Fall down stairs. Forehead abrasion, bloody nose, missing teeth. Initial encounter. EXAM: CT HEAD WITHOUT CONTRAST CT MAXILLOFACIAL WITHOUT CONTRAST CT CERVICAL SPINE WITHOUT CONTRAST TECHNIQUE: Multidetector CT imaging of the head, cervical spine, and maxillofacial structures were performed using the standard protocol without intravenous contrast. Multiplanar CT image reconstructions of the cervical spine and maxillofacial structures were also generated. COMPARISON:  None. FINDINGS: CT HEAD FINDINGS Brain: No evidence of acute infarction, hemorrhage, hydrocephalus, extra-axial collection or mass lesion/mass effect. Vascular: No hyperdense vessel or unexpected calcification. Skull: Normal. Negative for fracture or focal lesion. CT MAXILLOFACIAL FINDINGS Osseous: Remote left superior orbital rim repair. No acute fracture. Very poor dentition with multiple devitalized teeth and periapical erosions. There is extensive loss of the alveolar ridge involving the mandible. Orbits: No posttraumatic finding. Sinuses: Fluid levels in the right frontal sinus. Both maxillary sinuses are atelectatic and partially opacified. Soft tissues: No opaque foreign body or hematoma. CT CERVICAL SPINE FINDINGS Alignment: No traumatic malalignment. Skull base and vertebrae: Negative for acute fracture. Soft tissues and spinal canal: No prevertebral fluid or swelling. No visible canal hematoma. Disc levels: C5-6 and C6-7 uncovertebral spurring and foraminal narrowing. Upper chest: Biapical emphysema. IMPRESSION: 1. No evidence of intracranial or cervical  spine injury. 2. Negative for facial fracture. 3. 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 A superimposed tooth fracture would not be readily detectable. 4. Sinusitis. Electronically Signed   By: Monte Fantasia M.D.   On: 06/09/2016 13:54   Ct Abdomen Pelvis W Contrast  Result Date: 06/09/2016 CLINICAL DATA:  [place] down stairs. EXAM: CT CHEST, ABDOMEN, AND PELVIS WITH CONTRAST TECHNIQUE: Multidetector CT imaging of the chest, abdomen and pelvis was performed following the standard protocol during bolus administration of intravenous contrast. CONTRAST:  141m ISOVUE-300 IOPAMIDOL (ISOVUE-300) INJECTION 61% COMPARISON:  None. FINDINGS: CT CHEST FINDINGS Cardiovascular: Normal heart size. No pericardial effusion. The thoracic aorta appears intact. Mediastinum/Nodes: The trachea appears patent and is midline. Normal appearance of the esophagus. Prominent mediastinal lymph nodes identified. Left paratracheal node measures 8 mm. 1.3 cm right hilar lymph node is identified. The 1 cm sub- carinal  lymph node identified. Lungs/Pleura: No pleural effusion. Moderate to advanced changes of centrilobular and paraseptal emphysema. No pneumothorax or pulmonary contusion. 6 mm anteromedial right upper lobe lung nodule is identified, image 40 of series 203. Dependent changes and subsegmental atelectasis noted in the lung bases. Musculoskeletal: There are acute nondisplaced right sixth through tenth rib fractures. No acute left rib fractures. The sternum appears intact. There is a fracture deformity involving the T4 vertebra nondisplaced scratch set mild nondisplaced fracture involving the inferior endplate of T4 is suspected. CT ABDOMEN PELVIS FINDINGS Hepatobiliary: Hemangioma within the posterior right lobe of liver measures 3.6 cm. Within segment 6 there is a larger hemangioma measuring 3.6 cm. The liver is otherwise intact without evidence for rupture. Stones identified within the neck of gallbladder. No  biliary dilatation. Pancreas: Unremarkable. No pancreatic ductal dilatation or surrounding inflammatory changes. Spleen: Normal in size without focal abnormality. Adrenals/Urinary Tract: Adreniform enlargement of bilateral adrenal glands. No hemorrhage. The kidneys appear intact. Right kidney cyst noted. Several cystic structures are identified within the left side of pelvis. Favor large bladder diverticula. Urinary bladder otherwise appears normal. Stomach/Bowel: The stomach is normal. The small bowel loops have a normal course and caliber. The appendix is visualized and appears normal. No pathologic dilatation of the colon. Vascular/Lymphatic: Aortic atherosclerosis. No upper abdominal adenopathy. No pelvic or inguinal adenopathy. Reproductive:   Prostate gland is enlarged. Other: No free fluid or fluid collections. Musculoskeletal: Sclerotic lesion in the right iliac wing is favored to represent a benign bone island. 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 IMPRESSION: 1. Multiple acute right rib fracture deformities. There is also an age indeterminate T4 fracture deformity. 2. No evidence for solid organ injury within the abdomen or pelvis. 3. Diffuse bronchial wall thickening with emphysema, as above; imaging findings suggestive of underlying COPD. 4. Pulmonary nodule in the right lung measures 6 mm. Non-contrast chest CT at 6-12 months is recommended. If the nodule is stable at time of repeat CT, then future CT at 18-24 months (from today's scan) is considered optional for low-risk patients, but is recommended for high-risk patients. This recommendation follows the consensus statement: Guidelines for Management of Incidental Pulmonary Nodules Detected on CT Images:From the Fleischner Society 2017; published online before print (10.1148/radiol.[id number]). 5. Liver hemangiomas. 6. Gallstones 7. Prostate gland enlargement and bladder diverticula.  Electronically Signed   By: Kerby Moors M.D.   On: 06/09/2016 13:53   Ct Maxillofacial Wo Contrast  Result Date: 06/09/2016 CLINICAL DATA:  Fall down stairs. Forehead abrasion, bloody nose, missing teeth. Initial encounter. EXAM: CT HEAD WITHOUT CONTRAST CT MAXILLOFACIAL WITHOUT CONTRAST CT CERVICAL SPINE WITHOUT CONTRAST TECHNIQUE: Multidetector CT imaging of the head, cervical spine, and maxillofacial structures were performed using the standard protocol without intravenous contrast. Multiplanar CT image reconstructions of the cervical spine and maxillofacial structures were also generated. COMPARISON:  None. FINDINGS: CT HEAD FINDINGS Brain: No evidence of acute infarction, hemorrhage, hydrocephalus, extra-axial collection or mass lesion/mass effect. Vascular: No hyperdense vessel or unexpected calcification. Skull: Normal. Negative for fracture or focal lesion. CT MAXILLOFACIAL FINDINGS Osseous: Remote left superior orbital rim repair. No acute fracture. Very poor dentition with multiple devitalized teeth and periapical erosions. There is extensive loss of the alveolar ridge involving the mandible. Orbits: No posttraumatic finding. Sinuses: Fluid levels in the right frontal sinus. Both maxillary sinuses are atelectatic and partially opacified. Soft tissues: No opaque foreign body or hematoma. CT CERVICAL SPINE FINDINGS Alignment: No  traumatic malalignment. Skull base and vertebrae: Negative for acute fracture. Soft tissues and spinal canal: No prevertebral fluid or swelling. No visible canal hematoma. Disc levels: C5-6 and C6-7 uncovertebral spurring and foraminal narrowing. Upper chest: Biapical emphysema. IMPRESSION: 1. No evidence of intracranial or cervical spine injury. 2. Negative for facial fracture. 3. 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 A superimposed tooth fracture would not be readily detectable. 4. Sinusitis. Electronically Signed   By: Monte Fantasia M.D.   On:  06/09/2016 13:54    Assessment/Plan  1. Fractured ribs secondary to fall - Will admit to step down unit - CT scan reveals fractures to ribs 6-10 on right side; no pneumothorax or fluid noted. CT of head, cervical spine and abdomen are all benign. - pain control - IS, pulmonary toilet - tele   2. Possible syncopal episode - Noncontributory history - Will consult medicine for syncopal workup. Appreciate your recommendations  3. Pulmonary nodule; right lung - Recommended outpatient follow up in 6-12 months  4. Emphysematous changes on CT; - Recommend smoking cessation and follow up with PCP.  FEN: reg diet VTE: lovenox  Plan: pain control, pulmonary toilet  Roselle Locus, PA-S  Syncope with fall down stairs. R rib FX 6-10, multiple abrasions. He has undiagnosed COPD. Will admit to SDU for bronchodilators, pain control and pulmonary toilet. Medicine consult for syncope evaluation. I spoke with his sister as well.  Patient examined and I agree with the assessment and plan  Georganna Skeans, MD, MPH, FACS Trauma: [phone] General Surgery: [phone]  06/09/2016 4:09 PM

## 2016-06-09 NOTE — ED Provider Notes (Signed)
[facility]-EMERGENCY DEPT Provider Note   CSN: [id number] Arrival date & time: 06/09/16  1122     History   Chief Complaint Chief Complaint  Patient presents with  . Fall    HPI Patrick Kelly is a 66 y.o. male.  HPI  66 year old male with no reported medical history (though he admits he hasn't seen a doctor in years), who presents after falling down 12-14 stairs at his home. Patient states that he remembers being at the top of the stairs, then the next thing he remembers is waking up at the bottom of the stairs. He is unsure exactly how he fell. Endorses feeling "completely normal" before falling. Denies chest pain, shortness of breath, or palpitations. No prior history of syncope. No known cardiac issues. He reports pain over his right lateral lower rib cage. Also endorses pain over his nose. Denies headache, neck pain, back pain, abdominal pain. No nausea or vomiting. No vision changes. He is not on blood thinners. Pain in his right rib cage is worse with palpation and with movement.  History reviewed. No pertinent past medical history.  Patient Active Problem List   Diagnosis Date Noted  . Multiple fractures of ribs, right side, initial encounter for closed fracture 06/09/2016    History reviewed. No pertinent surgical history.     Home Medications    Prior to Admission medications   Not on File    Family History History reviewed. No pertinent family history.  Social History Social History  Substance Use Topics  . Smoking status: Current Every Day Smoker  . Smokeless tobacco: Never Used  . Alcohol use No     Allergies   Patient has no known allergies.   Review of Systems Review of Systems  Constitutional: Negative for chills and fever.  HENT: Positive for facial swelling (present over the bridge of the nose). Negative for congestion.   Eyes: Negative for pain and visual disturbance.  Respiratory: Negative for cough and shortness of breath.   Cardiovascular:  Positive for chest pain (R lateral chest wall). Negative for palpitations.  Gastrointestinal: Negative for abdominal pain, diarrhea, nausea and vomiting.  Genitourinary: Negative for dysuria and flank pain.  Musculoskeletal: Negative for arthralgias, back pain, joint swelling, neck pain and neck stiffness.  Skin: Negative for wound.  Neurological: Negative for weakness, numbness and headaches.  Psychiatric/Behavioral: Negative for agitation, behavioral problems and confusion.     Physical Exam Updated Vital Signs BP 112/90 (BP Location: Right Arm)   Pulse 89   Temp 97.6 F (36.4 C) (Oral)   Resp 20   SpO2 94%   Physical Exam  Constitutional: He is oriented to person, place, and time. He appears well-developed and well-nourished.  HENT:  Head: Normocephalic.  Mouth/Throat: Oropharynx is clear and moist.  Mild edema with ecchymosis over the bridge of the nose. Poor dentition throughout. No newly avulsed teeth.   Eyes: Conjunctivae and EOM are normal. Pupils are equal, round, and reactive to light.  Neck:  No midline TTP over the cervical spine. C-collar in place  Cardiovascular: Normal rate, regular rhythm, normal heart sounds and intact distal pulses.   No murmur heard. Pulmonary/Chest: Effort normal and breath sounds normal. No respiratory distress. He has no wheezes. He exhibits tenderness (R inferior lateral chest wall).  Abdominal: Soft. Bowel sounds are normal. He exhibits no distension. There is no tenderness. There is no guarding.  Musculoskeletal: He exhibits no edema.  Extremities atraumatic. No midline TTP over the T or L spine.  Pelvis stable.   Neurological: He is alert and oriented to person, place, and time. He exhibits normal muscle tone.  Skin: Skin is warm and dry.  Psychiatric: He has a normal mood and affect.  Nursing note and vitals reviewed.    ED Treatments / Results  Labs (all labs ordered are listed, but only abnormal results are displayed) Labs  Reviewed  CBC - Abnormal; Notable for the following:       Result Value   WBC 12.6 (*)    All other components within normal limits  COMPREHENSIVE METABOLIC PANEL - Abnormal; Notable for the following:    Sodium 134 (*)    Calcium 8.8 (*)    Total Protein 6.3 (*)    Albumin 3.4 (*)    ALT 7 (*)    All other components within normal limits  I-STAT TROPOININ, ED  I-STAT TROPOININ, ED    EKG  EKG Interpretation None       Radiology Ct Head Wo Contrast  Result Date: 06/09/2016 CLINICAL DATA:  Fall down stairs. Forehead abrasion, bloody nose, missing teeth. Initial encounter. EXAM: CT HEAD WITHOUT CONTRAST CT MAXILLOFACIAL WITHOUT CONTRAST CT CERVICAL SPINE WITHOUT CONTRAST TECHNIQUE: Multidetector CT imaging of the head, cervical spine, and maxillofacial structures were performed using the standard protocol without intravenous contrast. Multiplanar CT image reconstructions of the cervical spine and maxillofacial structures were also generated. COMPARISON:  None. FINDINGS: CT HEAD FINDINGS Brain: No evidence of acute infarction, hemorrhage, hydrocephalus, extra-axial collection or mass lesion/mass effect. Vascular: No hyperdense vessel or unexpected calcification. Skull: Normal. Negative for fracture or focal lesion. CT MAXILLOFACIAL FINDINGS Osseous: Remote left superior orbital rim repair. No acute fracture. Very poor dentition with multiple devitalized teeth and periapical erosions. There is extensive loss of the alveolar ridge involving the mandible. Orbits: No posttraumatic finding. Sinuses: Fluid levels in the right frontal sinus. Both maxillary sinuses are atelectatic and partially opacified. Soft tissues: No opaque foreign body or hematoma. CT CERVICAL SPINE FINDINGS Alignment: No traumatic malalignment. Skull base and vertebrae: Negative for acute fracture. Soft tissues and spinal canal: No prevertebral fluid or swelling. No visible canal hematoma. Disc levels: C5-6 and C6-7  uncovertebral spurring and foraminal narrowing. Upper chest: Biapical emphysema. IMPRESSION: 1. No evidence of intracranial or cervical spine injury. 2. Negative for facial fracture. 3. 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 A superimposed tooth fracture would not be readily detectable. 4. Sinusitis. Electronically Signed   By: Marnee Spring M.D.   On: 06/09/2016 13:54   Ct Chest W Contrast  Result Date: 06/09/2016 CLINICAL DATA:  Larey Seat down stairs. EXAM: CT CHEST, ABDOMEN, AND PELVIS WITH CONTRAST TECHNIQUE: Multidetector CT imaging of the chest, abdomen and pelvis was performed following the standard protocol during bolus administration of intravenous contrast. CONTRAST:  ISOVUE-300 IOPAMIDOL (ISOVUE-300) INJECTION 61% COMPARISON:  None. FINDINGS: CT CHEST FINDINGS Cardiovascular: Normal heart size. No pericardial effusion. The thoracic aorta appears intact. Mediastinum/Nodes: The trachea appears patent and is midline. Normal appearance of the esophagus. Prominent mediastinal lymph nodes identified. Left paratracheal node measures 8 mm. 1.3 cm right hilar lymph node is identified. The 1 cm sub- carinal lymph node identified. Lungs/Pleura: No pleural effusion. Moderate to advanced changes of centrilobular and paraseptal emphysema. No pneumothorax or pulmonary contusion. 6 mm anteromedial right upper lobe lung nodule is identified, image 40 of series 203. Dependent changes and subsegmental atelectasis noted in the lung bases. Musculoskeletal: There are acute nondisplaced right sixth through tenth rib fractures. No acute  left rib fractures. The sternum appears intact. There is a fracture deformity involving the T4 vertebra nondisplaced scratch set mild nondisplaced fracture involving the inferior endplate of T4 is suspected. CT ABDOMEN PELVIS FINDINGS Hepatobiliary: Hemangioma within the posterior right lobe of liver measures 3.6 cm. Within segment 6 there is a larger hemangioma  measuring 3.6 cm. The liver is otherwise intact without evidence for rupture. Stones identified within the neck of gallbladder. No biliary dilatation. Pancreas: Unremarkable. No pancreatic ductal dilatation or surrounding inflammatory changes. Spleen: Normal in size without focal abnormality. Adrenals/Urinary Tract: Adreniform enlargement of bilateral adrenal glands. No hemorrhage. The kidneys appear intact. Right kidney cyst noted. Several cystic structures are identified within the left side of pelvis. Favor large bladder diverticula. Urinary bladder otherwise appears normal. Stomach/Bowel: The stomach is normal. The small bowel loops have a normal course and caliber. The appendix is visualized and appears normal. No pathologic dilatation of the colon. Vascular/Lymphatic: Aortic atherosclerosis. No upper abdominal adenopathy. No pelvic or inguinal adenopathy. Reproductive:   Prostate gland is enlarged. Other: No free fluid or fluid collections. Musculoskeletal: Sclerotic lesion in the right iliac wing is favored to represent a benign bone island. 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 IMPRESSION: 1. Multiple acute right rib fracture deformities. There is also an age indeterminate T4 fracture deformity. 2. No evidence for solid organ injury within the abdomen or pelvis. 3. Diffuse bronchial wall thickening with emphysema, as above; imaging findings suggestive of underlying COPD. 4. Pulmonary nodule in the right lung measures 6 mm. Non-contrast chest CT at 6-12 months is recommended. If the nodule is stable at time of repeat CT, then future CT at 18-24 months (from today's scan) is considered optional for low-risk patients, but is recommended for high-risk patients. This recommendation follows the consensus statement: Guidelines for Management of Incidental Pulmonary Nodules Detected on CT Images:From the Fleischner Society 2017; published online before  print (10.1148/radiol.[id number]). 5. Liver hemangiomas. 6. Gallstones 7. Prostate gland enlargement and bladder diverticula. Electronically Signed   By: Signa Kell M.D.   On: 06/09/2016 13:53   Ct Cervical Spine Wo Contrast  Result Date: 06/09/2016 CLINICAL DATA:  Fall down stairs. Forehead abrasion, bloody nose, missing teeth. Initial encounter. EXAM: CT HEAD WITHOUT CONTRAST CT MAXILLOFACIAL WITHOUT CONTRAST CT CERVICAL SPINE WITHOUT CONTRAST TECHNIQUE: Multidetector CT imaging of the head, cervical spine, and maxillofacial structures were performed using the standard protocol without intravenous contrast. Multiplanar CT image reconstructions of the cervical spine and maxillofacial structures were also generated. COMPARISON:  None. FINDINGS: CT HEAD FINDINGS Brain: No evidence of acute infarction, hemorrhage, hydrocephalus, extra-axial collection or mass lesion/mass effect. Vascular: No hyperdense vessel or unexpected calcification. Skull: Normal. Negative for fracture or focal lesion. CT MAXILLOFACIAL FINDINGS Osseous: Remote left superior orbital rim repair. No acute fracture. Very poor dentition with multiple devitalized teeth and periapical erosions. There is extensive loss of the alveolar ridge involving the mandible. Orbits: No posttraumatic finding. Sinuses: Fluid levels in the right frontal sinus. Both maxillary sinuses are atelectatic and partially opacified. Soft tissues: No opaque foreign body or hematoma. CT CERVICAL SPINE FINDINGS Alignment: No traumatic malalignment. Skull base and vertebrae: Negative for acute fracture. Soft tissues and spinal canal: No prevertebral fluid or swelling. No visible canal hematoma. Disc levels: C5-6 and C6-7 uncovertebral spurring and foraminal narrowing. Upper chest: Biapical emphysema. IMPRESSION: 1. No evidence of intracranial or cervical spine injury. 2. Negative for facial fracture. 3. Diffuse dental caries with  endodontal and periodontal disease. A  superimposed tooth fracture would not be readily detectable. 4. Sinusitis. Electronically Signed   By: Marnee Spring M.D.   On: 06/09/2016 13:54   Ct Abdomen Pelvis W Contrast  Result Date: 06/09/2016 CLINICAL DATA:  Larey Seat down stairs. EXAM: CT CHEST, ABDOMEN, AND PELVIS WITH CONTRAST TECHNIQUE: Multidetector CT imaging of the chest, abdomen and pelvis was performed following the standard protocol during bolus administration of intravenous contrast. CONTRAST:  ISOVUE-300 IOPAMIDOL (ISOVUE-300) INJECTION 61% COMPARISON:  None. FINDINGS: CT CHEST FINDINGS Cardiovascular: Normal heart size. No pericardial effusion. The thoracic aorta appears intact. Mediastinum/Nodes: The trachea appears patent and is midline. Normal appearance of the esophagus. Prominent mediastinal lymph nodes identified. Left paratracheal node measures 8 mm. 1.3 cm right hilar lymph node is identified. The 1 cm sub- carinal lymph node identified. Lungs/Pleura: No pleural effusion. Moderate to advanced changes of centrilobular and paraseptal emphysema. No pneumothorax or pulmonary contusion. 6 mm anteromedial right upper lobe lung nodule is identified, image 40 of series 203. Dependent changes and subsegmental atelectasis noted in the lung bases. Musculoskeletal: There are acute nondisplaced right sixth through tenth rib fractures. No acute left rib fractures. The sternum appears intact. There is a fracture deformity involving the T4 vertebra nondisplaced scratch set mild nondisplaced fracture involving the inferior endplate of T4 is suspected. CT ABDOMEN PELVIS FINDINGS Hepatobiliary: Hemangioma within the posterior right lobe of liver measures 3.6 cm. Within segment 6 there is a larger hemangioma measuring 3.6 cm. The liver is otherwise intact without evidence for rupture. Stones identified within the neck of gallbladder. No biliary dilatation. Pancreas: Unremarkable. No pancreatic ductal dilatation or surrounding inflammatory changes.  Spleen: Normal in size without focal abnormality. Adrenals/Urinary Tract: Adreniform enlargement of bilateral adrenal glands. No hemorrhage. The kidneys appear intact. Right kidney cyst noted. Several cystic structures are identified within the left side of pelvis. Favor large bladder diverticula. Urinary bladder otherwise appears normal. Stomach/Bowel: The stomach is normal. The small bowel loops have a normal course and caliber. The appendix is visualized and appears normal. No pathologic dilatation of the colon. Vascular/Lymphatic: Aortic atherosclerosis. No upper abdominal adenopathy. No pelvic or inguinal adenopathy. Reproductive:   Prostate gland is enlarged. Other: No free fluid or fluid collections. Musculoskeletal: Sclerotic lesion in the right iliac wing is favored to represent a benign bone island. 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 IMPRESSION: 1. Multiple acute right rib fracture deformities. There is also an age indeterminate T4 fracture deformity. 2. No evidence for solid organ injury within the abdomen or pelvis. 3. Diffuse bronchial wall thickening with emphysema, as above; imaging findings suggestive of underlying COPD. 4. Pulmonary nodule in the right lung measures 6 mm. Non-contrast chest CT at 6-12 months is recommended. If the nodule is stable at time of repeat CT, then future CT at 18-24 months (from today's scan) is considered optional for low-risk patients, but is recommended for high-risk patients. This recommendation follows the consensus statement: Guidelines for Management of Incidental Pulmonary Nodules Detected on CT Images:From the Fleischner Society 2017; published online before print (10.1148/radiol.[id number]). 5. Liver hemangiomas. 6. Gallstones 7. Prostate gland enlargement and bladder diverticula. Electronically Signed   By: Signa Kell M.D.   On: 06/09/2016 13:53   Ct Maxillofacial Wo Contrast  Result Date:  06/09/2016 CLINICAL DATA:  Fall down stairs. Forehead abrasion, bloody nose, missing teeth. Initial encounter. EXAM: CT HEAD WITHOUT CONTRAST CT MAXILLOFACIAL WITHOUT CONTRAST CT CERVICAL SPINE  WITHOUT CONTRAST TECHNIQUE: Multidetector CT imaging of the head, cervical spine, and maxillofacial structures were performed using the standard protocol without intravenous contrast. Multiplanar CT image reconstructions of the cervical spine and maxillofacial structures were also generated. COMPARISON:  None. FINDINGS: CT HEAD FINDINGS Brain: No evidence of acute infarction, hemorrhage, hydrocephalus, extra-axial collection or mass lesion/mass effect. Vascular: No hyperdense vessel or unexpected calcification. Skull: Normal. Negative for fracture or focal lesion. CT MAXILLOFACIAL FINDINGS Osseous: Remote left superior orbital rim repair. No acute fracture. Very poor dentition with multiple devitalized teeth and periapical erosions. There is extensive loss of the alveolar ridge involving the mandible. Orbits: No posttraumatic finding. Sinuses: Fluid levels in the right frontal sinus. Both maxillary sinuses are atelectatic and partially opacified. Soft tissues: No opaque foreign body or hematoma. CT CERVICAL SPINE FINDINGS Alignment: No traumatic malalignment. Skull base and vertebrae: Negative for acute fracture. Soft tissues and spinal canal: No prevertebral fluid or swelling. No visible canal hematoma. Disc levels: C5-6 and C6-7 uncovertebral spurring and foraminal narrowing. Upper chest: Biapical emphysema. IMPRESSION: 1. No evidence of intracranial or cervical spine injury. 2. Negative for facial fracture. 3. 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 A superimposed tooth fracture would not be readily detectable. 4. Sinusitis. Electronically Signed   By: Marnee Spring M.D.   On: 06/09/2016 13:54    Procedures Procedures (including critical care time)  Medications Ordered in ED Medications    Tdap (BOOSTRIX) injection 0.5 mL (0.5 mLs Intramuscular Given 06/09/16 1220)  morphine 4 MG/ML injection 4 mg (4 mg Intravenous Given 06/09/16 1220)  iopamidol (ISOVUE-300) 61 % injection (100 mLs  Contrast Given 06/09/16 1309)  morphine 4 MG/ML injection 4 mg (4 mg Intravenous Given 06/09/16 1431)     Initial Impression / Assessment and Plan / ED Course  I have reviewed the triage vital signs and the nursing notes.  Pertinent labs & imaging results that were available during my care of the patient were reviewed by me and considered in my medical decision making (see chart for details).     Afebrile and hemodynamically stable. Patient does not remember the fall, so suspect loss of consciousness. He has severe tenderness to palpation over the right lateral inferior chest wall. Given fall from 14 stairs, full trauma scans obtained. CT head with no acute intracranial abnormalities. CT of the cervical spine with no acute fractures or malalignment. CT of the chest shows multiple acute right rib fractures, specifically right ribs 6-10. There is also a fracture deformity involving the T4 vertebrae that is nondisplaced. The patient does not have pain to palpation of this area, so I suspect that this is not acute. Remainder of the spine is unremarkable. CT face with diffuse dental caries, but no facial fractures.  Patient given morphine for management of pain secondary to his rib fractures. Trauma consultated, and will be admitting the patient to their service for pain control.  EKG without arhythmia to suggest cardiogenic syncope. Troponin 0.00. Hgb WNL.   Patient was incidentally found to have a 6 mm nodule to his right upper lobe. He smokes one pack of cigarettes a day. As he is high risk for malignancy, recommended follow-up in 6 months for repeat chest CT. This was discussed both with the patient and his sister at bedside. He was admitted in stable condition.  Care of patient overseen by my attending,  Dr. Dalene Seltzer.  Final Clinical Impressions(s) / ED Diagnoses   Final diagnoses:  Fall  Closed fracture of multiple ribs  of right side, initial encounter    New Prescriptions New Prescriptions   No medications on file     Charlie Pitter, MD 06/09/16 1527    Alvira Monday, MD 06/11/16 [phone]

## 2016-06-10 ENCOUNTER — Inpatient Hospital Stay (HOSPITAL_COMMUNITY): Payer: Medicaid Other

## 2016-06-10 ENCOUNTER — Other Ambulatory Visit (HOSPITAL_COMMUNITY): Payer: Self-pay

## 2016-06-10 DIAGNOSIS — K029 Dental caries, unspecified: Secondary | ICD-10-CM

## 2016-06-10 DIAGNOSIS — R938 Abnormal findings on diagnostic imaging of other specified body structures: Secondary | ICD-10-CM

## 2016-06-10 DIAGNOSIS — R55 Syncope and collapse: Secondary | ICD-10-CM

## 2016-06-10 DIAGNOSIS — I480 Paroxysmal atrial fibrillation: Secondary | ICD-10-CM

## 2016-06-10 DIAGNOSIS — I739 Peripheral vascular disease, unspecified: Secondary | ICD-10-CM

## 2016-06-10 LAB — CBC
HCT: 43.5 % (ref 39.0–52.0)
Hemoglobin: 14.6 g/dL (ref 13.0–17.0)
MCH: 33 pg (ref 26.0–34.0)
MCHC: 33.6 g/dL (ref 30.0–36.0)
MCV: 98.2 fL (ref 78.0–100.0)
PLATELETS: 187 10*3/uL (ref 150–400)
RBC: 4.43 MIL/uL (ref 4.22–5.81)
RDW: 13.4 % (ref 11.5–15.5)
WBC: 9 10*3/uL (ref 4.0–10.5)

## 2016-06-10 LAB — TROPONIN I: Troponin I: <0.03 ng/mL (ref <0.03)

## 2016-06-10 LAB — BASIC METABOLIC PANEL
ANION GAP: 9 (ref 5–15)
BUN: 12 mg/dL (ref 6–20)
CALCIUM: 8.8 mg/dL — AB (ref 8.9–10.3)
CO2: 27 mmol/L (ref 22–32)
Chloride: 99 mmol/L — ABNORMAL LOW (ref 101–111)
Creatinine, Ser: 0.9 mg/dL (ref 0.61–1.24)
GFR calc non Af Amer: >60 mL/min (ref >60)
Glucose, Bld: 112 mg/dL — ABNORMAL HIGH (ref 65–99)
POTASSIUM: 4.2 mmol/L (ref 3.5–5.1)
SODIUM: 135 mmol/L (ref 135–145)

## 2016-06-10 LAB — GLUCOSE, CAPILLARY
GLUCOSE-CAPILLARY: 98 mg/dL (ref 65–99)
Glucose-Capillary: 107 mg/dL — ABNORMAL HIGH (ref 65–99)

## 2016-06-10 LAB — CK: Total CK: 398 U/L — ABNORMAL HIGH (ref 49–397)

## 2016-06-10 MED ORDER — ALUM & MAG HYDROXIDE-SIMETH 200-200-20 MG/5ML PO SUSP
30.0000 mL | ORAL | Status: DC | PRN
  Administered 2016-06-10 – 2016-06-14 (×2): 30 mL via ORAL
  Filled 2016-06-10 (×3): qty 30

## 2016-06-10 MED ORDER — ASPIRIN 325 MG PO TABS
325.0000 mg | ORAL_TABLET | Freq: Once | ORAL | Status: AC
  Administered 2016-06-10: 325 mg via ORAL
  Filled 2016-06-10: qty 1

## 2016-06-10 MED ORDER — TIOTROPIUM BROMIDE MONOHYDRATE 18 MCG IN CAPS
18.0000 ug | ORAL_CAPSULE | Freq: Every day | RESPIRATORY_TRACT | Status: DC
  Administered 2016-06-11 – 2016-06-15 (×5): 18 ug via RESPIRATORY_TRACT
  Filled 2016-06-10: qty 5

## 2016-06-10 MED ORDER — NITROGLYCERIN 0.4 MG SL SUBL
SUBLINGUAL_TABLET | SUBLINGUAL | Status: AC
  Filled 2016-06-10: qty 1

## 2016-06-10 MED ORDER — LEVALBUTEROL HCL 0.63 MG/3ML IN NEBU: 0.6300 mg | INHALATION_SOLUTION | RESPIRATORY_TRACT | Status: DC | PRN

## 2016-06-10 MED ORDER — MOMETASONE FURO-FORMOTEROL FUM 200-5 MCG/ACT IN AERO
2.0000 | INHALATION_SPRAY | Freq: Two times a day (BID) | RESPIRATORY_TRACT | Status: DC
  Administered 2016-06-10 – 2016-06-15 (×10): 2 via RESPIRATORY_TRACT
  Filled 2016-06-10: qty 8.8

## 2016-06-10 MED ORDER — NITROGLYCERIN 0.4 MG SL SUBL
0.4000 mg | SUBLINGUAL_TABLET | SUBLINGUAL | Status: DC | PRN
  Administered 2016-06-10: 0.4 mg via SUBLINGUAL
  Filled 2016-06-10: qty 1

## 2016-06-10 NOTE — Progress Notes (Signed)
RN called regarding pt having a syncopal episode and desating to 70-80"s when he attempted to sit himself up in the bed to use the urinal. Pt lying flat in bed SpO2 93% on 3L , a/o x4. No neuro deficits noted. Advised RN to call [facility] for evaluation. No intervention from [facility]RT

## 2016-06-10 NOTE — Progress Notes (Addendum)
PROGRESS NOTE    Patrick Kelly  VOZ:[id number] DOB: 31-Jan-1951 DOA: 06/09/2016 PCP: No PCP Per Patient    Brief Narrative:  Patient is a 66 year old gentleman who presented with a unfortunate syncopal episode resulting in significant traumatic injury to the patient as he fell down 14 concrete steps. Patient admitted to the trauma team. Hospitalist consulted for management of general medical issues as well as evaluation of syncopal episode.  Assessment & Plan:   Principal Problem:   Syncope Active Problems:   Multiple fractures of ribs, right side, initial encounter for closed fracture   Sinusitis   Pulmonary nodule   Tobacco use   Abnormal CT of the chest   Dental caries  #1 syncope Likely cardiogenic in nature. 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 Patient noted overnight to going to atrial fibrillation was given Lopressor 10 mg IV 1 with coverage and back into normal sinus rhythm. Patient also noted to have several episodes of sinus pauses ranging from 2 to approximately 4 seconds. Patient also noted to have bradycardia with heart rates in the 30s. Head CT negative. Check a 2-D echo. TSH within normal limits of 0.506. Patient with no focal neurological symptoms. Hold any AV nodal blocking agents. Consult with cardiology for further evaluation and management.  #2 sinusitis Noted on CT maxillofacial. Patient also noted on admission to have a leukocytosis. Continue Augmentin and treat for 7-10 days.  #3 abnormal chest CT/pulmonary nodule right upper lung per CT chest Patient with an extensive history of tobacco abuse and ongoing tobacco use. Patient does not have a PCP. CT chest with findings concerning for emphysema. Patient will need repeat CT chest to follow-up on pulmonary nodule of the right lung in about 6-12 months. Place patient on Spiriva and Dulera. DuoNeb nebs as needed. Case manager to assist with finding PCP.  #4 tobacco abuse Tobacco cessation. Patient not interested  in nicotine patch at this time.  #5 dental caries Patient with known dental care. Outpatient follow-up with dental  #6 multiple rib fractures on the right side Continue sent to spirometry. Per trauma team.  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 Patient with ongoing tobacco abuse. Tobacco cessation stressed to patient. Patient is currently stable is speaking in full sentences. Place on [place]. Nebs as needed.    DVT prophylaxis: Lovenox Code Status: Full Family Communication: Updated patient. No family at bedside. Disposition Plan: Per primary team.   Consultants:   [facility]: Dr Konrad Dolores 06/09/2016  Cardiology pending  Procedures:   CT head 06/09/2016  CT maxillofacial and C-spine 06/09/2016.  CT chest 06/09/2016  CT abdomen and pelvis 06/09/2016  Chest x-ray 06/10/2016  Antimicrobials:  Augmentin 06/09/2016   Subjective: Patient denies any chest pain. No shortness of breath. Patient with complaints of right-sided rib pain with movement. Events overnight noted with patient initially in atrial fibrillation which resolved with IV Lopressor and then patient subsequently with multiple episodes of sinus pauses as high as approximately 4 seconds as well as bradycardia with heart rates in the 30s.  Objective: Vitals:   06/10/16 0400 06/10/16 0443 06/10/16 0700 06/10/16 0731  BP: 119/69 119/69 116/67   Pulse: 61 (!) 57 68   Resp: Temp:  98.2 F (36.8 C) 98.2 F (36.8 C)   TempSrc:  Oral Oral   SpO2: 95% (!) 87% 96% 97%  Weight:      Height:        Intake/Output Summary (Last 24 hours) at 06/10/16 1019  Last data filed at 06/10/16 0400  Gross per 24 hour  Intake           512.17 ml  Output                0 ml  Net           512.17 ml   Filed Weights   06/10/16 0316  Weight: 79.6 kg (175 lb 7.8 oz)    Examination:  General exam: Appears calm and comfortable.Abrasion noted on no. Respiratory system: Clear to auscultation anterior  lung fields. Respiratory effort normal. Cardiovascular system: S1 & S2 heard, RRR. No JVD, murmurs, rubs, gallops or clicks. No pedal edema. Gastrointestinal system: Abdomen is nondistended, soft and nontender. No organomegaly or masses felt. Normal bowel sounds heard. Central nervous system: Alert and oriented. No focal neurological deficits. Extremities: Symmetric 5 x 5 power. Skin: No rashes, lesions or ulcers Psychiatry: Judgement and insight appear normal. Mood & affect appropriate.     Data Reviewed: I have personally reviewed following labs and imaging studies  CBC:  Recent Labs Lab 06/09/16 1156 06/10/16 0232  WBC 12.6* 9.0  HGB 14.7 14.6  HCT 42.9 43.5  MCV 97.1 98.2  PLT 172 187   Basic Metabolic Panel:  Recent Labs Lab 06/09/16 1156 06/10/16 0232  NA 134* 135  K 4.0 4.2  CL 102 99*  CO2 25 27  GLUCOSE 91 112*  BUN 11 12  CREATININE 0.97 0.90  CALCIUM 8.8* 8.8*   GFR: Estimated Creatinine Clearance: 81.8 mL/min (by C-G formula based on SCr of 0.9 mg/dL). Liver Function Tests:  Recent Labs Lab 06/09/16 1156  AST 19  ALT 7*  ALKPHOS 89  BILITOT 0.9  PROT 6.3*  ALBUMIN 3.4*   No results for input(s): LIPASE, AMYLASE in the last 168 hours. No results for input(s): AMMONIA in the last 168 hours. Coagulation Profile: No results for input(s): INR, PROTIME in the last 168 hours. Cardiac Enzymes:  Recent Labs Lab 06/09/16 2001 06/10/16 0232 06/10/16 0650  CKTOTAL  --  398*  --   TROPONINI <0.03  --  <0.03   BNP (last 3 results) No results for input(s): PROBNP in the last 8760 hours. HbA1C: No results for input(s): HGBA1C in the last 72 hours. CBG:  Recent Labs Lab 06/10/16 0513 06/10/16 0810  GLUCAP 107* 98   Lipid Profile: No results for input(s): CHOL, HDL, LDLCALC, TRIG, CHOLHDL, LDLDIRECT in the last 72 hours. Thyroid Function Tests:  Recent Labs  06/09/16 1609  TSH 0.506   Anemia Panel: No results for input(s): VITAMINB12,  FOLATE, FERRITIN, TIBC, IRON, RETICCTPCT in the last 72 hours. Sepsis Labs: No results for input(s): PROCALCITON, LATICACIDVEN in the last 168 hours.  Recent Results (from the past 240 hour(s))  MRSA PCR Screening     Status: None   Collection Time: 06/09/16  8:57 PM  Result Value Ref Range Status   MRSA by PCR NEGATIVE NEGATIVE Final    Comment:        The GeneXpert MRSA Assay (FDA approved for NASAL specimens only), is one component of a comprehensive MRSA colonization surveillance program. It is not intended to diagnose MRSA infection nor to guide or monitor treatment for MRSA infections.          Radiology Studies: Ct Head Wo Contrast  Result Date: 06/09/2016 CLINICAL DATA:  Fall down stairs. Forehead abrasion, bloody nose, missing teeth. Initial encounter. EXAM: CT HEAD WITHOUT CONTRAST CT MAXILLOFACIAL WITHOUT CONTRAST CT CERVICAL  SPINE WITHOUT CONTRAST TECHNIQUE: Multidetector CT imaging of the head, cervical spine, and maxillofacial structures were performed using the standard protocol without intravenous contrast. Multiplanar CT image reconstructions of the cervical spine and maxillofacial structures were also generated. COMPARISON:  None. FINDINGS: CT HEAD FINDINGS Brain: No evidence of acute infarction, hemorrhage, hydrocephalus, extra-axial collection or mass lesion/mass effect. Vascular: No hyperdense vessel or unexpected calcification. Skull: Normal. Negative for fracture or focal lesion. CT MAXILLOFACIAL FINDINGS Osseous: Remote left superior orbital rim repair. No acute fracture. Very poor dentition with multiple devitalized teeth and periapical erosions. There is extensive loss of the alveolar ridge involving the mandible. Orbits: No posttraumatic finding. Sinuses: Fluid levels in the right frontal sinus. Both maxillary sinuses are atelectatic and partially opacified. Soft tissues: No opaque foreign body or hematoma. CT CERVICAL SPINE FINDINGS Alignment: No traumatic  malalignment. Skull base and vertebrae: Negative for acute fracture. Soft tissues and spinal canal: No prevertebral fluid or swelling. No visible canal hematoma. Disc levels: C5-6 and C6-7 uncovertebral spurring and foraminal narrowing. Upper chest: Biapical emphysema. IMPRESSION: 1. No evidence of intracranial or cervical spine injury. 2. Negative for facial fracture. 3. 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 A superimposed tooth fracture would not be readily detectable. 4. Sinusitis. Electronically Signed   By: Marnee Spring M.D.   On: 06/09/2016 13:54   Ct Chest W Contrast  Result Date: 06/09/2016 CLINICAL DATA:  Larey Seat down stairs. EXAM: CT CHEST, ABDOMEN, AND PELVIS WITH CONTRAST TECHNIQUE: Multidetector CT imaging of the chest, abdomen and pelvis was performed following the standard protocol during bolus administration of intravenous contrast. CONTRAST:  ISOVUE-300 IOPAMIDOL (ISOVUE-300) INJECTION 61% COMPARISON:  None. FINDINGS: CT CHEST FINDINGS Cardiovascular: Normal heart size. No pericardial effusion. The thoracic aorta appears intact. Mediastinum/Nodes: The trachea appears patent and is midline. Normal appearance of the esophagus. Prominent mediastinal lymph nodes identified. Left paratracheal node measures 8 mm. 1.3 cm right hilar lymph node is identified. The 1 cm sub- carinal lymph node identified. Lungs/Pleura: No pleural effusion. Moderate to advanced changes of centrilobular and paraseptal emphysema. No pneumothorax or pulmonary contusion. 6 mm anteromedial right upper lobe lung nodule is identified, image 40 of series 203. Dependent changes and subsegmental atelectasis noted in the lung bases. Musculoskeletal: There are acute nondisplaced right sixth through tenth rib fractures. No acute left rib fractures. The sternum appears intact. There is a fracture deformity involving the T4 vertebra nondisplaced scratch set mild nondisplaced fracture involving the inferior  endplate of T4 is suspected. CT ABDOMEN PELVIS FINDINGS Hepatobiliary: Hemangioma within the posterior right lobe of liver measures 3.6 cm. Within segment 6 there is a larger hemangioma measuring 3.6 cm. The liver is otherwise intact without evidence for rupture. Stones identified within the neck of gallbladder. No biliary dilatation. Pancreas: Unremarkable. No pancreatic ductal dilatation or surrounding inflammatory changes. Spleen: Normal in size without focal abnormality. Adrenals/Urinary Tract: Adreniform enlargement of bilateral adrenal glands. No hemorrhage. The kidneys appear intact. Right kidney cyst noted. Several cystic structures are identified within the left side of pelvis. Favor large bladder diverticula. Urinary bladder otherwise appears normal. Stomach/Bowel: The stomach is normal. The small bowel loops have a normal course and caliber. The appendix is visualized and appears normal. No pathologic dilatation of the colon. Vascular/Lymphatic: Aortic atherosclerosis. No upper abdominal adenopathy. No pelvic or inguinal adenopathy. Reproductive:   Prostate gland is enlarged. Other: No free fluid or fluid collections. Musculoskeletal: Sclerotic lesion in the right iliac wing is favored to represent a benign bone  island. 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 IMPRESSION: 1. Multiple acute right rib fracture deformities. There is also an age indeterminate T4 fracture deformity. 2. No evidence for solid organ injury within the abdomen or pelvis. 3. Diffuse bronchial wall thickening with emphysema, as above; imaging findings suggestive of underlying COPD. 4. Pulmonary nodule in the right lung measures 6 mm. Non-contrast chest CT at 6-12 months is recommended. If the nodule is stable at time of repeat CT, then future CT at 18-24 months (from today's scan) is considered optional for low-risk patients, but is recommended for high-risk patients. This  recommendation follows the consensus statement: Guidelines for Management of Incidental Pulmonary Nodules Detected on CT Images:From the Fleischner Society 2017; published online before print (10.1148/[id number]). 5. Liver hemangiomas. 6. Gallstones 7. Prostate gland enlargement and bladder diverticula. Electronically Signed   By: Signa Kell M.D.   On: 06/09/2016 13:53   Ct Cervical Spine Wo Contrast  Result Date: 06/09/2016 CLINICAL DATA:  Fall down stairs. Forehead abrasion, bloody nose, missing teeth. Initial encounter. EXAM: CT HEAD WITHOUT CONTRAST CT MAXILLOFACIAL WITHOUT CONTRAST CT CERVICAL SPINE WITHOUT CONTRAST TECHNIQUE: Multidetector CT imaging of the head, cervical spine, and maxillofacial structures were performed using the standard protocol without intravenous contrast. Multiplanar CT image reconstructions of the cervical spine and maxillofacial structures were also generated. COMPARISON:  None. FINDINGS: CT HEAD FINDINGS Brain: No evidence of acute infarction, hemorrhage, hydrocephalus, extra-axial collection or mass lesion/mass effect. Vascular: No hyperdense vessel or unexpected calcification. Skull: Normal. Negative for fracture or focal lesion. CT MAXILLOFACIAL FINDINGS Osseous: Remote left superior orbital rim repair. No acute fracture. Very poor dentition with multiple devitalized teeth and periapical erosions. There is extensive loss of the alveolar ridge involving the mandible. Orbits: No posttraumatic finding. Sinuses: Fluid levels in the right frontal sinus. Both maxillary sinuses are atelectatic and partially opacified. Soft tissues: No opaque foreign body or hematoma. CT CERVICAL SPINE FINDINGS Alignment: No traumatic malalignment. Skull base and vertebrae: Negative for acute fracture. Soft tissues and spinal canal: No prevertebral fluid or swelling. No visible canal hematoma. Disc levels: C5-6 and C6-7 uncovertebral spurring and foraminal narrowing. Upper chest: Biapical  emphysema. IMPRESSION: 1. No evidence of intracranial or cervical spine injury. 2. Negative for facial fracture. 3. 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 A superimposed tooth fracture would not be readily detectable. 4. Sinusitis. Electronically Signed   By: Marnee Spring M.D.   On: 06/09/2016 13:54   Ct Abdomen Pelvis W Contrast  Result Date: 06/09/2016 CLINICAL DATA:  Larey Seat down stairs. EXAM: CT CHEST, ABDOMEN, AND PELVIS WITH CONTRAST TECHNIQUE: Multidetector CT imaging of the chest, abdomen and pelvis was performed following the standard protocol during bolus administration of intravenous contrast. CONTRAST:  ISOVUE-300 IOPAMIDOL (ISOVUE-300) INJECTION 61% COMPARISON:  None. FINDINGS: CT CHEST FINDINGS Cardiovascular: Normal heart size. No pericardial effusion. The thoracic aorta appears intact. Mediastinum/Nodes: The trachea appears patent and is midline. Normal appearance of the esophagus. Prominent mediastinal lymph nodes identified. Left paratracheal node measures 8 mm. 1.3 cm right hilar lymph node is identified. The 1 cm sub- carinal lymph node identified. Lungs/Pleura: No pleural effusion. Moderate to advanced changes of centrilobular and paraseptal emphysema. No pneumothorax or pulmonary contusion. 6 mm anteromedial right upper lobe lung nodule is identified, image 40 of series 203. Dependent changes and subsegmental atelectasis noted in the lung bases. Musculoskeletal: There are acute nondisplaced right sixth through tenth rib fractures. No acute left  rib fractures. The sternum appears intact. There is a fracture deformity involving the T4 vertebra nondisplaced scratch set mild nondisplaced fracture involving the inferior endplate of T4 is suspected. CT ABDOMEN PELVIS FINDINGS Hepatobiliary: Hemangioma within the posterior right lobe of liver measures 3.6 cm. Within segment 6 there is a larger hemangioma measuring 3.6 cm. The liver is otherwise intact without evidence  for rupture. Stones identified within the neck of gallbladder. No biliary dilatation. Pancreas: Unremarkable. No pancreatic ductal dilatation or surrounding inflammatory changes. Spleen: Normal in size without focal abnormality. Adrenals/Urinary Tract: Adreniform enlargement of bilateral adrenal glands. No hemorrhage. The kidneys appear intact. Right kidney cyst noted. Several cystic structures are identified within the left side of pelvis. Favor large bladder diverticula. Urinary bladder otherwise appears normal. Stomach/Bowel: The stomach is normal. The small bowel loops have a normal course and caliber. The appendix is visualized and appears normal. No pathologic dilatation of the colon. Vascular/Lymphatic: Aortic atherosclerosis. No upper abdominal adenopathy. No pelvic or inguinal adenopathy. Reproductive:   Prostate gland is enlarged. Other: No free fluid or fluid collections. Musculoskeletal: Sclerotic lesion in the right iliac wing is favored to represent a benign bone island. 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 IMPRESSION: 1. Multiple acute right rib fracture deformities. There is also an age indeterminate T4 fracture deformity. 2. No evidence for solid organ injury within the abdomen or pelvis. 3. Diffuse bronchial wall thickening with emphysema, as above; imaging findings suggestive of underlying COPD. 4. Pulmonary nodule in the right lung measures 6 mm. Non-contrast chest CT at 6-12 months is recommended. If the nodule is stable at time of repeat CT, then future CT at 18-24 months (from today's scan) is considered optional for low-risk patients, but is recommended for high-risk patients. This recommendation follows the consensus statement: Guidelines for Management of Incidental Pulmonary Nodules Detected on CT Images:From the Fleischner Society 2017; published online before print (10.1148/[id number]). 5. Liver hemangiomas. 6.  Gallstones 7. Prostate gland enlargement and bladder diverticula. Electronically Signed   By: Signa Kell M.D.   On: 06/09/2016 13:53   Dg Chest Port 1 View  Result Date: 06/10/2016 CLINICAL DATA:  Multiple right rib fractures. EXAM: PORTABLE CHEST 1 VIEW COMPARISON:  06/09/2016 FINDINGS: No pneumothorax. No effusion. The lungs are clear except for mild chronic appearing interstitial coarsening and mild linear scarring or atelectasis in the left base. Mediastinal contours are normal. No displaced fractures. IMPRESSION: Negative for pneumothorax or effusion. Mediastinal contours are normal. Moderate interstitial coarsening may be chronic. Electronically Signed   By: Ellery Plunk M.D.   On: 06/10/2016 06:13   Ct Maxillofacial Wo Contrast  Result Date: 06/09/2016 CLINICAL DATA:  Fall down stairs. Forehead abrasion, bloody nose, missing teeth. Initial encounter. EXAM: CT HEAD WITHOUT CONTRAST CT MAXILLOFACIAL WITHOUT CONTRAST CT CERVICAL SPINE WITHOUT CONTRAST TECHNIQUE: Multidetector CT imaging of the head, cervical spine, and maxillofacial structures were performed using the standard protocol without intravenous contrast. Multiplanar CT image reconstructions of the cervical spine and maxillofacial structures were also generated. COMPARISON:  None. FINDINGS: CT HEAD FINDINGS Brain: No evidence of acute infarction, hemorrhage, hydrocephalus, extra-axial collection or mass lesion/mass effect. Vascular: No hyperdense vessel or unexpected calcification. Skull: Normal. Negative for fracture or focal lesion. CT MAXILLOFACIAL FINDINGS Osseous: Remote left superior orbital rim repair. No acute fracture. Very poor dentition with multiple devitalized teeth and periapical erosions. There is extensive loss of the alveolar ridge involving the mandible. Orbits: No posttraumatic finding. Sinuses:  Fluid levels in the right frontal sinus. Both maxillary sinuses are atelectatic and partially opacified. Soft tissues: No  opaque foreign body or hematoma. CT CERVICAL SPINE FINDINGS Alignment: No traumatic malalignment. Skull base and vertebrae: Negative for acute fracture. Soft tissues and spinal canal: No prevertebral fluid or swelling. No visible canal hematoma. Disc levels: C5-6 and C6-7 uncovertebral spurring and foraminal narrowing. Upper chest: Biapical emphysema. IMPRESSION: 1. No evidence of intracranial or cervical spine injury. 2. Negative for facial fracture. 3. 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 A superimposed tooth fracture would not be readily detectable. 4. Sinusitis. Electronically Signed   By: Marnee Spring M.D.   On: 06/09/2016 13:54        Scheduled Meds: . amoxicillin-clavulanate  1 tablet Oral Q12H  . enoxaparin (LOVENOX) injection  40 mg Subcutaneous Q24H  . ipratropium-albuterol  3 mL Nebulization Q6H  . pantoprazole  40 mg Oral Daily   Or  . pantoprazole (PROTONIX) IV  40 mg Intravenous Daily  . sodium chloride flush  3 mL Intravenous Q12H   Continuous Infusions: . dextrose 5 % and 0.45 % NaCl with KCl 20 mEq/L 50 mL/hr at 06/09/16 2237     LOS: 1 day    Time spent: 40 minutes    Ashden Sonnenberg,[place]lad[place]law, MD [facility] Pager [phone]  If 7PM-7AM, please contact night-coverage [email or web address] Password [email or web address] 06/10/2016, 10:19 AM

## 2016-06-10 NOTE — Progress Notes (Addendum)
Patient experienced a 3.34 sec sinus pause at 2134. He then experienced a short episode of bradycardia in the high 30's. Dr. Janee Morn was notified and advised me to just keep an eye on his status and watch for symptoms. It appears that the episodes occur directly after he deep breathes and coughs. Patient has experienced multiple episodes of unsustained bradycardia throughout the night but has remained asymptomatic.  Will continue to monitor and pass along infomation to day time nurse. Lavell Anchors, RN    4:44 AM Patient continuing to have sinus pauses. Dr. Janee Morn is aware. Patient is still asymptomatic. I will continue to monitor patient.

## 2016-06-10 NOTE — Progress Notes (Signed)
Patient tried to sit up and said it " felt like the wind was knocked out of him" and began to destat to high 70s low 80s. Patient placed on NRB, RRT at beside. Diminished lung sounds bilaterally. Patient has repeated questions in the last hour so Rapid Response Nurse paged. Chest Xray completed, awaiting results. Patient has been reporting indigestion that did not improve after prn 0316 dose of Maalox. Internal medicine paged, 12 Lead EKG ordered, patient given 1 SL tab of nitro which he said made the indigestion "tolerable". Aspirin also given. 12 Lead is unchanged, interpretation reads that patient is in "Sinus Bradycardia with marked sinus arrhythmia otherwise normal ECG". Troponin Q6 will been drawn but no further intervention by internal medicine at this time. Will monitor patient and pass along information to day shift.

## 2016-06-10 NOTE — Care Management Note (Signed)
Case Management Note  Patient Details  Name: Patrick Kelly MRN: [id number] Date of Birth: Jan 21, 1951  Subjective/Objective:      Pt is a 66 y/o male admitted after sustaining a fall down a flight of stairs secondary to a syncopal episode.  Pt with LOC and Rt 6-10 rib fx.  PTA, pt resides at home alone.                Action/Plan: Met with pt and family member at bedside.  CM referral for PCP and dental referral.  Pt uninsured, but is 65 and does qualify for Medicare.  He states he has not applied, but plans to.  Encouraged pt to do this; gave website for online application and directions to social security office in [place] to apply in person.  Pt given list of indigent dental clinics in [place] and surrounding area.  He is agreeable to follow up at [facility] for [facility]CP; clinic information put on AVS in EPIC.  Will make follow up appointment for pt if available.  Pt states he plans to stay with family members at [place], likely mother and brother, though PT recommending SNF at dc.  Will consult CSW to follow for possible SNF.   Will follow progress..  Expected Discharge Date:                  Expected Discharge Plan:  Home/Self Care  In-House Referral:     Discharge planning Services  CM Consult  Post Acute Care Choice:    Choice offered to:     DME Arranged:    DME Agency:     HH Arranged:    [employer] Agency:     Status of Service:  In process, will continue to follow  If discussed at Long Length of Stay Meetings, dates discussed:    Additional Comments:  Reinaldo Raddle, RN, BSN  Trauma/Neuro ICU Case Manager [phone]

## 2016-06-10 NOTE — Consult Note (Signed)
Patient ID: Patrick Kelly MRN: [id number], DOB/AGE: 1950/03/19   Admit date: 06/09/2016  Reason for Consult: Syncope, paroxsymal atrial fibrillation/bradycardia w/ sinus pauses on telemetry Requesting Physician: Dr. Janee Morn, Internal Medicine    Primary Physician: No PCP Per Patient Primary Cardiologist: New (Dr. Tenny Craw)   Pt. Profile:  Patrick Kelly is a 66 y.o. male smoker with no other significant PMH who was admitted for syncope resulting significant traumatic injury (fell down 14 concrete steps). Pt admitted to trama team. Internal medicine conducting w/u for etiology of his syncope. Pt observed to have paroxsymal atrial fibrillation and notable sinus pauses on telemetry. [facility] asked to see for evaluation of suspected cardiogenic syncope, at the request of Dr. Janee Morn, Internal Medicine.   Pt does not exercise regularly  He walks at work  No change in his abilites to do this No Palpiatitions, No dizziness    Problem List  Past Medical History:  Diagnosis Date  . Abnormal CT of the chest    emphysemetous changes suggestive copd  . Dental caries   . Multiple fractures of ribs   . Pulmonary nodule   . Sinusitis   . Syncope   . Tobacco use     History reviewed. No pertinent surgical history.   Allergies  No Known Allergies  HPI  Patrick Kelly is a 66 y.o. male smoker with no other significant PMH who was admitted for syncope resulting significant traumatic injury (fell down 14 concrete steps). Pt admitted to trama team. Internal medicine conducting w/u for etiology of his syncope. Pt observed to have paroxsymal atrial fibrillation and notable sinus pauses on telemetry. [facility] asked to see for evaluation of suspected cardiogenic syncope, at the request of Dr. Janee Morn, Internal Medicine.   Pt admits that he has not been seen by a medical provider in over 10 years, thus unsure of any potential underlying chronic conditions such as HTN, HLD and  DM. He has been a smoker for over 35 years and smokes 1 ppd. He denies any family h/o CAD or SCD. He also denies any h/o exertional chest pain or dyspnea. No h/o syncope until yesterday.   He was in his usual state of health until day of admission, 06/09/16. He woke up feeling fine. No symptoms. He was getting ready to leave his 2nd floor apartment. He remembers grabbing his keys and walking out the door. The next thing he remembers is that he was laying on the ground at the bottom of the steps. His keys had fallen out of his hand. His syncope occurred w/o warning. No prodrome. He denies CP, dyspnea, palpitations, dizziness, nausea or near syncope. No incontinence. This has never happened to him before. He does however reports a h/o intermittent palpitations for several years but has never seen a provider for this.   He is not sure how long he was out    Pt was admitted by Trauma team and Internal medicine consulted for syncope w/u. Night of 06/09/16, patient was noted to go into atrial fibrillation w/ RVR and was given a dose of IV metoprolol, 10 mg x 1 and converted back to NSR. He has also been observed to have intermittent bradycardia with rates in the 30s and sinus pauses ranging from 2-4 seconds on telemetry. K is WNL at 4.2. TSH also normal at 0.506. POC troponin in the ED was negative. Troponin I negative x 1. Serial troponins pending. 2D echo pending. Head CT of the head and  neck in the ED was negative for bleed/ fractures. Chest CT confirmed multiple acute right rib fractures. There is also an age indeterminate T4 fracture deformity. No evidence for solid organ injury within the abdomen or pelvis. There are incidental findings of a 6mm right lung nodule and findings suggestive of CODP as well as gallstones and prostate gland enlargement. CXR negative for pneumothorax or effusion. Initial EKG on 06/09/16 showed NSR with borderline ST elevations in inferior leads. No prior EKGs for comparison. QT sl long  EKG  at 7 PM showed atrial fib with RVR  Repeat EKG SB with normal QT interval  Tele with sinus pauses up to 3-4 sec.    His home medications have been reviewed. He is not on any AV nodal blocking agents.  Pt denies any further syncope/ near syncope since admission.  Telemetry currently shows NSR with HR in the 70s. There also appears to be a 9 beat run of NSVT that just occurred.     Home Medications  Prior to Admission medications   Not on [facility] Medications  . amoxicillin-clavulanate  1 tablet Oral Q12H  . enoxaparin (LOVENOX) injection  40 mg Subcutaneous Q24H  . mometasone-formoterol  2 puff Inhalation BID  . pantoprazole  40 mg Oral Daily   Or  . pantoprazole (PROTONIX) IV  40 mg Intravenous Daily  . sodium chloride flush  3 mL Intravenous Q12H  . tiotropium  18 mcg Inhalation Daily   . dextrose 5 % and 0.45 % NaCl with KCl 20 mEq/L 50 mL/hr at 06/09/16 2237   acetaminophen, alum & mag hydroxide-simeth, hydrALAZINE, HYDROmorphone (DILAUDID) injection, levalbuterol, methocarbamol (ROBAXIN)  IV, nitroGLYCERIN, ondansetron **OR** ondansetron (ZOFRAN) IV, oxyCODONE  Family History  Family History  Problem Relation Age of Onset  . Hypertension Sister     Social History  Social History   Social History  . Marital status: Single    Spouse name: N/A  . Number of children: N/A  . Years of education: N/A   Occupational History  . Not on file.   Social History Main Topics  . Smoking status: Current Every Day Smoker  . Smokeless tobacco: Never Used  . Alcohol use No  . Drug use: No  . Sexual activity: Not on file   Other Topics Concern  . Not on file   Social History Narrative  . No narrative on file     Review of Systems General:  No chills, fever, night sweats or weight changes.  Cardiovascular:  No chest pain, dyspnea on exertion, edema, orthopnea, palpitations, paroxysmal nocturnal dyspnea. Dermatological: No rash, lesions/masses Respiratory: No  cough, dyspnea Urologic: No hematuria, dysuria Abdominal:   No nausea, vomiting, diarrhea, bright red blood per rectum, melena, or hematemesis Neurologic:  No visual changes, wkns, changes in mental status. All other systems reviewed and are otherwise negative except as noted above.  Physical Exam  Blood pressure 116/67, pulse 68, temperature 98.2 F (36.8 C), temperature source Oral, resp. rate 13, height 5\' 9"  (1.753 m), weight 175 lb 7.8 oz (79.6 kg), SpO2 97 %.  General: Pleasant, NAD, a bit disheveled looking, dentition in poor repair  Psych: Normal affect. Neuro: Alert and oriented X 3. Moves all extremities spontaneously. HEENT: Bruised   Neck: Supple without bruits or JVD. Lungs:  Resp regular and unlabored, CTA. Heart: RRR no s3, s4, or murmurs. Abdomen: Soft, non-tender, non-distended, BS + x 4.  Extremities: No clubbing, cyanosis or edema. DP/PT/Radials 2+ and equal  bilaterally.  Labs  Troponin [facility] Test)  Recent Labs  06/09/16 1841  TROPIPOC 0.00    Recent Labs  06/09/16 2001 06/10/16 0232 06/10/16 0650  CKTOTAL  --  398*  --   TROPONINI <0.03  --  <0.03   Lab Results  Component Value Date   WBC 9.0 06/10/2016   HGB 14.6 06/10/2016   HCT 43.5 06/10/2016   MCV 98.2 06/10/2016   PLT 187 06/10/2016    Recent Labs Lab 06/09/16 1156 06/10/16 0232  NA 134* 135  K 4.0 4.2  CL 102 99*  CO2 25 27  BUN 11 12  CREATININE 0.97 0.90  CALCIUM 8.8* 8.8*  PROT 6.3*  --   BILITOT 0.9  --   ALKPHOS 89  --   ALT 7*  --   AST 19  --   GLUCOSE 91 112*   No results found for: CHOL, HDL, LDLCALC, TRIG No results found for: DDIMER   Radiology/Studies  Ct Head Wo Contrast  Result Date: 06/09/2016 CLINICAL DATA:  Fall down stairs. Forehead abrasion, bloody nose, missing teeth. Initial encounter. EXAM: CT HEAD WITHOUT CONTRAST CT MAXILLOFACIAL WITHOUT CONTRAST CT CERVICAL SPINE WITHOUT CONTRAST TECHNIQUE: Multidetector CT imaging of the head, cervical  spine, and maxillofacial structures were performed using the standard protocol without intravenous contrast. Multiplanar CT image reconstructions of the cervical spine and maxillofacial structures were also generated. COMPARISON:  None. FINDINGS: CT HEAD FINDINGS Brain: No evidence of acute infarction, hemorrhage, hydrocephalus, extra-axial collection or mass lesion/mass effect. Vascular: No hyperdense vessel or unexpected calcification. Skull: Normal. Negative for fracture or focal lesion. CT MAXILLOFACIAL FINDINGS Osseous: Remote left superior orbital rim repair. No acute fracture. Very poor dentition with multiple devitalized teeth and periapical erosions. There is extensive loss of the alveolar ridge involving the mandible. Orbits: No posttraumatic finding. Sinuses: Fluid levels in the right frontal sinus. Both maxillary sinuses are atelectatic and partially opacified. Soft tissues: No opaque foreign body or hematoma. CT CERVICAL SPINE FINDINGS Alignment: No traumatic malalignment. Skull base and vertebrae: Negative for acute fracture. Soft tissues and spinal canal: No prevertebral fluid or swelling. No visible canal hematoma. Disc levels: C5-6 and C6-7 uncovertebral spurring and foraminal narrowing. Upper chest: Biapical emphysema. IMPRESSION: 1. No evidence of intracranial or cervical spine injury. 2. Negative for facial fracture. 3. 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 A superimposed tooth fracture would not be readily detectable. 4. Sinusitis. Electronically Signed   By: Marnee Spring M.D.   On: 06/09/2016 13:54   Ct Chest W Contrast  Result Date: 06/09/2016 CLINICAL DATA:  Larey Seat down stairs. EXAM: CT CHEST, ABDOMEN, AND PELVIS WITH CONTRAST TECHNIQUE: Multidetector CT imaging of the chest, abdomen and pelvis was performed following the standard protocol during bolus administration of intravenous contrast. CONTRAST:  ISOVUE-300 IOPAMIDOL (ISOVUE-300) INJECTION 61%  COMPARISON:  None. FINDINGS: CT CHEST FINDINGS Cardiovascular: Normal heart size. No pericardial effusion. The thoracic aorta appears intact. Mediastinum/Nodes: The trachea appears patent and is midline. Normal appearance of the esophagus. Prominent mediastinal lymph nodes identified. Left paratracheal node measures 8 mm. 1.3 cm right hilar lymph node is identified. The 1 cm sub- carinal lymph node identified. Lungs/Pleura: No pleural effusion. Moderate to advanced changes of centrilobular and paraseptal emphysema. No pneumothorax or pulmonary contusion. 6 mm anteromedial right upper lobe lung nodule is identified, image 40 of series 203. Dependent changes and subsegmental atelectasis noted in the lung bases. Musculoskeletal: There are acute nondisplaced right sixth through tenth rib  fractures. No acute left rib fractures. The sternum appears intact. There is a fracture deformity involving the T4 vertebra nondisplaced scratch set mild nondisplaced fracture involving the inferior endplate of T4 is suspected. CT ABDOMEN PELVIS FINDINGS Hepatobiliary: Hemangioma within the posterior right lobe of liver measures 3.6 cm. Within segment 6 there is a larger hemangioma measuring 3.6 cm. The liver is otherwise intact without evidence for rupture. Stones identified within the neck of gallbladder. No biliary dilatation. Pancreas: Unremarkable. No pancreatic ductal dilatation or surrounding inflammatory changes. Spleen: Normal in size without focal abnormality. Adrenals/Urinary Tract: Adreniform enlargement of bilateral adrenal glands. No hemorrhage. The kidneys appear intact. Right kidney cyst noted. Several cystic structures are identified within the left side of pelvis. Favor large bladder diverticula. Urinary bladder otherwise appears normal. Stomach/Bowel: The stomach is normal. The small bowel loops have a normal course and caliber. The appendix is visualized and appears normal. No pathologic dilatation of the colon.  Vascular/Lymphatic: Aortic atherosclerosis. No upper abdominal adenopathy. No pelvic or inguinal adenopathy. Reproductive:   Prostate gland is enlarged. Other: No free fluid or fluid collections. Musculoskeletal: Sclerotic lesion in the right iliac wing is favored to represent a benign bone island. 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 IMPRESSION: 1. Multiple acute right rib fracture deformities. There is also an age indeterminate T4 fracture deformity. 2. No evidence for solid organ injury within the abdomen or pelvis. 3. Diffuse bronchial wall thickening with emphysema, as above; imaging findings suggestive of underlying COPD. 4. Pulmonary nodule in the right lung measures 6 mm. Non-contrast chest CT at 6-12 months is recommended. If the nodule is stable at time of repeat CT, then future CT at 18-24 months (from today's scan) is considered optional for low-risk patients, but is recommended for high-risk patients. This recommendation follows the consensus statement: Guidelines for Management of Incidental Pulmonary Nodules Detected on CT Images:From the Fleischner Society 2017; published online before print (10.1148/[id number]). 5. Liver hemangiomas. 6. Gallstones 7. Prostate gland enlargement and bladder diverticula. Electronically Signed   By: Signa Kell M.D.   On: 06/09/2016 13:53   Ct Cervical Spine Wo Contrast  Result Date: 06/09/2016 CLINICAL DATA:  Fall down stairs. Forehead abrasion, bloody nose, missing teeth. Initial encounter. EXAM: CT HEAD WITHOUT CONTRAST CT MAXILLOFACIAL WITHOUT CONTRAST CT CERVICAL SPINE WITHOUT CONTRAST TECHNIQUE: Multidetector CT imaging of the head, cervical spine, and maxillofacial structures were performed using the standard protocol without intravenous contrast. Multiplanar CT image reconstructions of the cervical spine and maxillofacial structures were also generated. COMPARISON:  None. FINDINGS: CT  HEAD FINDINGS Brain: No evidence of acute infarction, hemorrhage, hydrocephalus, extra-axial collection or mass lesion/mass effect. Vascular: No hyperdense vessel or unexpected calcification. Skull: Normal. Negative for fracture or focal lesion. CT MAXILLOFACIAL FINDINGS Osseous: Remote left superior orbital rim repair. No acute fracture. Very poor dentition with multiple devitalized teeth and periapical erosions. There is extensive loss of the alveolar ridge involving the mandible. Orbits: No posttraumatic finding. Sinuses: Fluid levels in the right frontal sinus. Both maxillary sinuses are atelectatic and partially opacified. Soft tissues: No opaque foreign body or hematoma. CT CERVICAL SPINE FINDINGS Alignment: No traumatic malalignment. Skull base and vertebrae: Negative for acute fracture. Soft tissues and spinal canal: No prevertebral fluid or swelling. No visible canal hematoma. Disc levels: C5-6 and C6-7 uncovertebral spurring and foraminal narrowing. Upper chest: Biapical emphysema. IMPRESSION: 1. No evidence of intracranial or cervical spine injury. 2. Negative for facial fracture. 3. Diffuse  dental caries with endodontal and periodontal disease. A superimposed tooth fracture would not be readily detectable. 4. Sinusitis. Electronically Signed   By: Marnee Spring M.D.   On: 06/09/2016 13:54   Ct Abdomen Pelvis W Contrast  Result Date: 06/09/2016 CLINICAL DATA:  Larey Seat down stairs. EXAM: CT CHEST, ABDOMEN, AND PELVIS WITH CONTRAST TECHNIQUE: Multidetector CT imaging of the chest, abdomen and pelvis was performed following the standard protocol during bolus administration of intravenous contrast. CONTRAST:  ISOVUE-300 IOPAMIDOL (ISOVUE-300) INJECTION 61% COMPARISON:  None. FINDINGS: CT CHEST FINDINGS Cardiovascular: Normal heart size. No pericardial effusion. The thoracic aorta appears intact. Mediastinum/Nodes: The trachea appears patent and is midline. Normal appearance of the esophagus. Prominent  mediastinal lymph nodes identified. Left paratracheal node measures 8 mm. 1.3 cm right hilar lymph node is identified. The 1 cm sub- carinal lymph node identified. Lungs/Pleura: No pleural effusion. Moderate to advanced changes of centrilobular and paraseptal emphysema. No pneumothorax or pulmonary contusion. 6 mm anteromedial right upper lobe lung nodule is identified, image 40 of series 203. Dependent changes and subsegmental atelectasis noted in the lung bases. Musculoskeletal: There are acute nondisplaced right sixth through tenth rib fractures. No acute left rib fractures. The sternum appears intact. There is a fracture deformity involving the T4 vertebra nondisplaced scratch set mild nondisplaced fracture involving the inferior endplate of T4 is suspected. CT ABDOMEN PELVIS FINDINGS Hepatobiliary: Hemangioma within the posterior right lobe of liver measures 3.6 cm. Within segment 6 there is a larger hemangioma measuring 3.6 cm. The liver is otherwise intact without evidence for rupture. Stones identified within the neck of gallbladder. No biliary dilatation. Pancreas: Unremarkable. No pancreatic ductal dilatation or surrounding inflammatory changes. Spleen: Normal in size without focal abnormality. Adrenals/Urinary Tract: Adreniform enlargement of bilateral adrenal glands. No hemorrhage. The kidneys appear intact. Right kidney cyst noted. Several cystic structures are identified within the left side of pelvis. Favor large bladder diverticula. Urinary bladder otherwise appears normal. Stomach/Bowel: The stomach is normal. The small bowel loops have a normal course and caliber. The appendix is visualized and appears normal. No pathologic dilatation of the colon. Vascular/Lymphatic: Aortic atherosclerosis. No upper abdominal adenopathy. No pelvic or inguinal adenopathy. Reproductive:   Prostate gland is enlarged. Other: No free fluid or fluid collections. Musculoskeletal: Sclerotic lesion in the right iliac wing  is favored to represent a benign bone island. 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 IMPRESSION: 1. Multiple acute right rib fracture deformities. There is also an age indeterminate T4 fracture deformity. 2. No evidence for solid organ injury within the abdomen or pelvis. 3. Diffuse bronchial wall thickening with emphysema, as above; imaging findings suggestive of underlying COPD. 4. Pulmonary nodule in the right lung measures 6 mm. Non-contrast chest CT at 6-12 months is recommended. If the nodule is stable at time of repeat CT, then future CT at 18-24 months (from today's scan) is considered optional for low-risk patients, but is recommended for high-risk patients. This recommendation follows the consensus statement: Guidelines for Management of Incidental Pulmonary Nodules Detected on CT Images:From the Fleischner Society 2017; published online before print (10.1148/radiol.[id number]). 5. Liver hemangiomas. 6. Gallstones 7. Prostate gland enlargement and bladder diverticula. Electronically Signed   By: Signa Kell M.D.   On: 06/09/2016 13:53   Dg Chest Port 1 View  Result Date: 06/10/2016 CLINICAL DATA:  Multiple right rib fractures. EXAM: PORTABLE CHEST 1 VIEW COMPARISON:  06/09/2016 FINDINGS: No pneumothorax. No effusion. The lungs are  clear except for mild chronic appearing interstitial coarsening and mild linear scarring or atelectasis in the left base. Mediastinal contours are normal. No displaced fractures. IMPRESSION: Negative for pneumothorax or effusion. Mediastinal contours are normal. Moderate interstitial coarsening may be chronic. Electronically Signed   By: Ellery Plunk M.D.   On: 06/10/2016 06:13   Ct Maxillofacial Wo Contrast  Result Date: 06/09/2016 CLINICAL DATA:  Fall down stairs. Forehead abrasion, bloody nose, missing teeth. Initial encounter. EXAM: CT HEAD WITHOUT CONTRAST CT MAXILLOFACIAL WITHOUT CONTRAST CT  CERVICAL SPINE WITHOUT CONTRAST TECHNIQUE: Multidetector CT imaging of the head, cervical spine, and maxillofacial structures were performed using the standard protocol without intravenous contrast. Multiplanar CT image reconstructions of the cervical spine and maxillofacial structures were also generated. COMPARISON:  None. FINDINGS: CT HEAD FINDINGS Brain: No evidence of acute infarction, hemorrhage, hydrocephalus, extra-axial collection or mass lesion/mass effect. Vascular: No hyperdense vessel or unexpected calcification. Skull: Normal. Negative for fracture or focal lesion. CT MAXILLOFACIAL FINDINGS Osseous: Remote left superior orbital rim repair. No acute fracture. Very poor dentition with multiple devitalized teeth and periapical erosions. There is extensive loss of the alveolar ridge involving the mandible. Orbits: No posttraumatic finding. Sinuses: Fluid levels in the right frontal sinus. Both maxillary sinuses are atelectatic and partially opacified. Soft tissues: No opaque foreign body or hematoma. CT CERVICAL SPINE FINDINGS Alignment: No traumatic malalignment. Skull base and vertebrae: Negative for acute fracture. Soft tissues and spinal canal: No prevertebral fluid or swelling. No visible canal hematoma. Disc levels: C5-6 and C6-7 uncovertebral spurring and foraminal narrowing. Upper chest: Biapical emphysema. IMPRESSION: 1. No evidence of intracranial or cervical spine injury. 2. Negative for facial fracture. 3. 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 A superimposed tooth fracture would not be readily detectable. 4. Sinusitis. Electronically Signed   By: Marnee Spring M.D.   On: 06/09/2016 13:54    ECG  Initial EKG 06/09/16 showed NSR with borderline ST elevations in the inferior leads -- personally reviewed Interval EKG 06/09/16 showed atrial fibrillation w/ RVR 139 bpm -- personally reviewed Repeat EKG 06/10/16 shows sinus brady 58 bpm -- personally reviewed    Telemetry  NSR currently with rate in the 70s, occasional bradycardia with sinus pauses of 2-3 sec, 9 beat NSVT -- personally reviewed   Echocardiogram Pending   ASSESSMENT AND PLAN  Principal Problem:   Syncope Active Problems:   Multiple fractures of ribs, right side, initial encounter for closed fracture   Sinusitis   Pulmonary nodule   Tobacco use   Abnormal CT of the chest   Dental caries   1. Syncope: high concern for cardiogenic syncope given sudden occurrence w/o prodrome. Also evidence of atrial arrhthymias w/ intermittent bradycardia concerning for SSS. Head CT was negative for bleed. Initial troponin I negative. F/u 2nd and 3rd troponins pending. Initial EKG showed SR with sl ST elevation (transient, motion) in the inferior leads. Cardiac risk factors include 35 year h/o tobacco abuse.  Inferior ischemia/ SA nodal disease. Plan for nuclear stress test  in the am. If negative ischemic w/u and no other identifiable explanation for his syncope, we will need to obtain EP consult to determine need for PPM vs implantable loop recorder w/ driving restriction x 6 months.    2. 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 TSH and K WNL. 2D echo pending. Portable echo by Dr Tenny Craw showed normal LV and RV function . Marland Kitchen We will hold off on initiation of anticoagulation at this time.  This may change after further w/u is conducted. Continue to monitor with tele    3. NSVT: 9 beat run noted ~1155. Obtaining 2D Echo today to assess LVEF. He denies anginal symptoms. Plan on NST in the am to assess for ischemia.   4. Traumatic Fall: secondary to syncope. Chest CT confirmed right sided rib fractures. No other significant injury. Trama team/IM to manage pain.   5. Tobacco Use: 35 + year history. Smoking cessation strongly advised.   6. Pulmonary Nodule: incidental finding on chest CT. 6 mm right lung nodule. 35+ year smoking history. This needs to be followed.  Needs to establish care with a PCP.   7. Other Screening: pt has not been followed by a PCP in over 10 years. Recommend checking lipid panel and Hgb A1c to exclude HLD and DM. He needs to establish care with PCP.    Signed, Robbie Lis, PA-C, M[facility] 06/10/2016, 10:54 AM [facility] Pager: [phone]   PT seen and examined  I have amended note above by B Simmons Briefly, pt is a 66 yo with long history of tob use  Admitted after syncopal spell  Fell down stairs  No prodrome  Pt denies angian but does not exercise I would recomm echo, continue tele  So far pt has had transient afib, pause (7:30 AM 4 sec) and 9 beat NSVT. WIll plan on nuclear stress test in AM  WIll discusse with EP No driving for 6 months from last event unless cause identified and corrected  Counselled on smoking cessation WIll check lipds in AM  Pt has evid of atherosclerosis on CT scan.  Dietrich Pates

## 2016-06-10 NOTE — Evaluation (Signed)
Physical Therapy Evaluation Patient Details Name: Patrick Kelly MRN: [id number] DOB: 09/15/1950 Today's Date: 06/10/2016   History of Present Illness  Pt is a 66 y/o male admitted after sustaining a fall down a flight of stairs secondary to a syncopal episode. +LOC. Pt with R 6-10 rib fxs. PMH including but not limited to tobacco use.  Clinical Impression  Pt presented supine in bed with HOB elevated, awake and initially reluctant to participate but agreeable to sit EOB with max encouragement. Prior to admission, pt reported that he was independent with all functional mobility and ADLs. He lives alone in an upstairs apartment. Pt currently requires min-mod A with bed mobility and was very limited this session secondary to pain. Pt also with some confusion and cognitive issues (see below for details). Pt on 2L of O2 with SPO2 decreasing to mid 80's with activity. Pt's HR and BP stable and WNL throughout. Pt would continue to benefit from skilled physical therapy services at this time while admitted and after d/c to address the below listed limitations in order to improve overall safety and independence with functional mobility.     Follow Up Recommendations SNF;Supervision/Assistance - 24 hour    Equipment Recommendations  None recommended by PT;Other (comment) (defer to next venue)    Recommendations for Other Services       Precautions / Restrictions Precautions Precautions: Fall Precaution Comments: R rib fxs Restrictions Weight Bearing Restrictions: No      Mobility  Bed Mobility Overal bed mobility: Needs Assistance Bed Mobility: Rolling;Sidelying to Sit;Sit to Sidelying Rolling: Min assist Sidelying to sit: Mod assist     Sit to sidelying: Min assist General bed mobility comments: increased time and effort, use of bed rails, min A with bilateral LEs and mod A for trunk elevation  Transfers                 General transfer comment: pt refusing at this  time  Ambulation/Gait                Stairs            Wheelchair Mobility    Modified Rankin (Stroke Patients Only)       Balance Overall balance assessment: Needs assistance Sitting-balance support: Feet supported Sitting balance-Leahy Scale: Poor Sitting balance - Comments: pt reliant on at least one UE support and min A                                     Pertinent Vitals/Pain Pain Assessment: Faces Faces Pain Scale: Hurts even more Pain Location: R ribs Pain Descriptors / Indicators: Sore;Grimacing;Guarding Pain Intervention(s): Monitored during session;Repositioned;Premedicated before session    Home Living Family/patient expects to be discharged to:: Private residence Living Arrangements: Alone Available Help at Discharge: Family;Friend(s);Available PRN/intermittently Type of Home: Apartment Home Access: Stairs to enter Entrance Stairs-Rails: Right;Left;Can reach both Entrance Stairs-Number of Steps: flight Home Layout: One level Home Equipment: None      Prior Function Level of Independence: Independent               Hand Dominance        Extremity/Trunk Assessment   Upper Extremity Assessment Upper Extremity Assessment: Overall WFL for tasks assessed    Lower Extremity Assessment Lower Extremity Assessment: Overall WFL for tasks assessed       Communication   Communication: No difficulties  Cognition Arousal/Alertness: Awake/alert Behavior  During Therapy: WFL for tasks assessed/performed Overall Cognitive Status: Impaired/Different from baseline Area of Impairment: Memory;Following commands;Safety/judgement;Problem solving                     Memory: Decreased short-term memory Following Commands: Follows one step commands with increased time;Follows one step commands consistently Safety/Judgement: Decreased awareness of safety;Decreased awareness of deficits   Problem Solving: Slow  processing;Decreased initiation;Difficulty sequencing;Requires verbal cues;Requires tactile cues        General Comments      Exercises     Assessment/Plan    PT Assessment Patient needs continued PT services  PT Problem List Decreased strength;Decreased activity tolerance;Decreased balance;Decreased mobility;Decreased coordination;Decreased cognition;Decreased knowledge of use of DME;Decreased safety awareness;Cardiopulmonary status limiting activity;Pain       PT Treatment Interventions DME instruction;Gait training;Stair training;Therapeutic activities;Functional mobility training;Therapeutic exercise;Balance training;Neuromuscular re-education;Cognitive remediation;Patient/family education    PT Goals (Current goals can be found in the Care Plan section)  Acute Rehab PT Goals Patient Stated Goal: decrease pain PT Goal Formulation: With patient Time For Goal Achievement: 06/24/16 Potential to Achieve Goals: Fair    Frequency Min 3X/week   Barriers to discharge Decreased caregiver support      Co-evaluation               End of Session Equipment Utilized During Treatment: Oxygen Activity Tolerance: Patient limited by pain;Patient limited by fatigue Patient left: in bed;with call bell/phone within reach;with bed alarm set;with family/visitor present Nurse Communication: Mobility status PT Visit Diagnosis: Other abnormalities of gait and mobility (R26.89);History of falling (Z91.81);Pain Pain - Right/Left: Right Pain - part of body:  (ribs)    Time: 1610-9604 PT Time Calculation (min) (ACUTE ONLY): 18 min   Charges:   PT Evaluation $PT Eval Moderate Complexity: 1 Procedure     PT G Codes:        Deborah Chalk, PT, DPT [phone]   Patrick Kelly 06/10/2016, 2:57 PM

## 2016-06-10 NOTE — Clinical Social Work Note (Signed)
Clinical Social Work Assessment  Patient Details  Name: Patrick Kelly MRN: [id number] Date of Birth: [birth year]  Date of referral:  06/10/16               Reason for consult:  Facility Placement, Trauma                Permission sought to share information with:  Family Supports Permission granted to share information::  Yes, Verbal Permission Granted  Name::        Agency::     Relationship::  pt sister and mom at bedside  Contact Information:     Housing/Transportation Living arrangements for the past 2 months:  Apartment Source of Information:  Patient Patient Interpreter Needed:  None Criminal Activity/Legal Involvement Pertinent to Current Situation/Hospitalization:  No - Comment as needed Significant Relationships:  Siblings, Parents Lives with:  Self Do you feel safe going back to the place where you live?  No Need for family participation in patient care:  No (Coment)  Care giving concerns:  Pt living in upstairs apartment by himself- concerns about his safety at home given his new impairment after the fall.   Social Worker assessment / plan:  CSW met with pt to do general assessment and discuss safe DC planning.  Pt reports that the fall was kind of a blur and that he does not remember much about the incident.  He has no idea what would have caused the fall and continued to state how much a instant of not being aware or a small trip can lead to such a terrible fall.  Patient in good spirits at this time and is hopeful for a full recovery. Pt mom at bedside states that pt will be living with her and pt brother at time of DC and that he will have plenty of support to return home safely.  CSW asked for family to leave room and then completed SBIRT with patient.  Patient states he rarely drinks (maybe once or twice a year and not to excess on those occasions).  Pt states he never understood what people see in drugs/alcohol and has never had a desire to abuse them.  Pt denies  using substances the morning of his accident.  Employment status:  [employer] information:  Self Pay (Medicaid Pending) PT Recommendations:  [place] / Referral to community resources:     Patient/Family's Response to care:  Pt is hopeful for a quick recovery from his injuries but is a little overwhelmed by his injuries at this time and wondering if his pain could be managed more efficiently.  Patient/Family's Understanding of and Emotional Response to Diagnosis, Current Treatment, and Prognosis:  Pt seems to have a good understanding of his current condition but expressed confusion at why the doctors are so fixated on learning why he fell.  Emotional Assessment Appearance:  Appears stated age, Disheveled Attitude/Demeanor/Rapport:    Affect (typically observed):  Appropriate, Pleasant Orientation:  Oriented to Situation, Oriented to  Time, Oriented to Place, Oriented to Self Alcohol / Substance use:  Not Applicable Psych involvement (Current and /or in the community):  No (Comment)  Discharge Needs  Concerns to be addressed:  No discharge needs identified Readmission within the last 30 days:  No Current discharge risk:  Physical Impairment Barriers to Discharge:  Continued Medical Work up   Jorge Ny, LCSW 06/10/2016, 4:11 PM

## 2016-06-10 NOTE — Progress Notes (Signed)
Trauma Service Note  Subjective: Patient looks good this morning.  No distress.  Had a couple of bursts of atrial fibrillation throughout the evening, converted with a single dose of Lopressor.  Objective: Vital signs in last 24 hours: Temp:  [97.6 F (36.4 C)-98.2 F (36.8 C)] 98.2 F (36.8 C) (04/06 0700) Pulse Rate:  [53-129] 68 (04/06 0700) Resp:  [8-25] 13 (04/06 0700) BP: (105-141)/(67-94) 116/67 (04/06 0700) SpO2:  [64 %-100 %] 97 % (04/06 0731) Weight:  [79.6 kg (175 lb 7.8 oz)] 79.6 kg (175 lb 7.8 oz) (04/06 0316) Last BM Date:  (PTA)  Intake/Output from previous day: 04/05 0701 - 04/06 0700 In: 512.2 [P.O.:240; I.V.:272.2] Out: 0  Intake/Output this shift: No intake/output data recorded.  General: No distress  Lungs: Clear to auscultation.  CXR without PTX  Abd: Benign  Extremities: No changes  Neuro: Intact  Lab Results: CBC   Recent Labs  06/09/16 1156 06/10/16 0232  WBC 12.6* 9.0  HGB 14.7 14.6  HCT 42.9 43.5  PLT 172 187   BMET  Recent Labs  06/09/16 1156 06/10/16 0232  NA 134* 135  K 4.0 4.2  CL 102 99*  CO2 25 27  GLUCOSE 91 112*  BUN 11 12  CREATININE 0.97 0.90  CALCIUM 8.8* 8.8*   PT/INR No results for input(s): LABPROT, INR in the last 72 hours. ABG No results for input(s): PHART, HCO3 in the last 72 hours.  Invalid input(s): PCO2, PO2  Studies/Results: Ct Head Wo Contrast  Result Date: 06/09/2016 CLINICAL DATA:  Fall down stairs. Forehead abrasion, bloody nose, missing teeth. Initial encounter. EXAM: CT HEAD WITHOUT CONTRAST CT MAXILLOFACIAL WITHOUT CONTRAST CT CERVICAL SPINE WITHOUT CONTRAST TECHNIQUE: Multidetector CT imaging of the head, cervical spine, and maxillofacial structures were performed using the standard protocol without intravenous contrast. Multiplanar CT image reconstructions of the cervical spine and maxillofacial structures were also generated. COMPARISON:  None. FINDINGS: CT HEAD FINDINGS Brain: No evidence  of acute infarction, hemorrhage, hydrocephalus, extra-axial collection or mass lesion/mass effect. Vascular: No hyperdense vessel or unexpected calcification. Skull: Normal. Negative for fracture or focal lesion. CT MAXILLOFACIAL FINDINGS Osseous: Remote left superior orbital rim repair. No acute fracture. Very poor dentition with multiple devitalized teeth and periapical erosions. There is extensive loss of the alveolar ridge involving the mandible. Orbits: No posttraumatic finding. Sinuses: Fluid levels in the right frontal sinus. Both maxillary sinuses are atelectatic and partially opacified. Soft tissues: No opaque foreign body or hematoma. CT CERVICAL SPINE FINDINGS Alignment: No traumatic malalignment. Skull base and vertebrae: Negative for acute fracture. Soft tissues and spinal canal: No prevertebral fluid or swelling. No visible canal hematoma. Disc levels: C5-6 and C6-7 uncovertebral spurring and foraminal narrowing. Upper chest: Biapical emphysema. IMPRESSION: 1. No evidence of intracranial or cervical spine injury. 2. Negative for facial fracture. 3. 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 A superimposed tooth fracture would not be readily detectable. 4. Sinusitis. Electronically Signed   By: Marnee Spring M.D.   On: 06/09/2016 13:54   Ct Chest W Contrast  Result Date: 06/09/2016 CLINICAL DATA:  Larey Seat down stairs. EXAM: CT CHEST, ABDOMEN, AND PELVIS WITH CONTRAST TECHNIQUE: Multidetector CT imaging of the chest, abdomen and pelvis was performed following the standard protocol during bolus administration of intravenous contrast. CONTRAST:  ISOVUE-300 IOPAMIDOL (ISOVUE-300) INJECTION 61% COMPARISON:  None. FINDINGS: CT CHEST FINDINGS Cardiovascular: Normal heart size. No pericardial effusion. The thoracic aorta appears intact. Mediastinum/Nodes: The trachea appears patent and is midline. Normal  appearance of the esophagus. Prominent mediastinal lymph nodes identified. Left  paratracheal node measures 8 mm. 1.3 cm right hilar lymph node is identified. The 1 cm sub- carinal lymph node identified. Lungs/Pleura: No pleural effusion. Moderate to advanced changes of centrilobular and paraseptal emphysema. No pneumothorax or pulmonary contusion. 6 mm anteromedial right upper lobe lung nodule is identified, image 40 of series 203. Dependent changes and subsegmental atelectasis noted in the lung bases. Musculoskeletal: There are acute nondisplaced right sixth through tenth rib fractures. No acute left rib fractures. The sternum appears intact. There is a fracture deformity involving the T4 vertebra nondisplaced scratch set mild nondisplaced fracture involving the inferior endplate of T4 is suspected. CT ABDOMEN PELVIS FINDINGS Hepatobiliary: Hemangioma within the posterior right lobe of liver measures 3.6 cm. Within segment 6 there is a larger hemangioma measuring 3.6 cm. The liver is otherwise intact without evidence for rupture. Stones identified within the neck of gallbladder. No biliary dilatation. Pancreas: Unremarkable. No pancreatic ductal dilatation or surrounding inflammatory changes. Spleen: Normal in size without focal abnormality. Adrenals/Urinary Tract: Adreniform enlargement of bilateral adrenal glands. No hemorrhage. The kidneys appear intact. Right kidney cyst noted. Several cystic structures are identified within the left side of pelvis. Favor large bladder diverticula. Urinary bladder otherwise appears normal. Stomach/Bowel: The stomach is normal. The small bowel loops have a normal course and caliber. The appendix is visualized and appears normal. No pathologic dilatation of the colon. Vascular/Lymphatic: Aortic atherosclerosis. No upper abdominal adenopathy. No pelvic or inguinal adenopathy. Reproductive:   Prostate gland is enlarged. Other: No free fluid or fluid collections. Musculoskeletal: Sclerotic lesion in the right iliac wing is favored to represent a benign bone  island. 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 IMPRESSION: 1. Multiple acute right rib fracture deformities. There is also an age indeterminate T4 fracture deformity. 2. No evidence for solid organ injury within the abdomen or pelvis. 3. Diffuse bronchial wall thickening with emphysema, as above; imaging findings suggestive of underlying COPD. 4. Pulmonary nodule in the right lung measures 6 mm. Non-contrast chest CT at 6-12 months is recommended. If the nodule is stable at time of repeat CT, then future CT at 18-24 months (from today's scan) is considered optional for low-risk patients, but is recommended for high-risk patients. This recommendation follows the consensus statement: Guidelines for Management of Incidental Pulmonary Nodules Detected on CT Images:From the Fleischner Society 2017; published online before print (10.1148/[id number]). 5. Liver hemangiomas. 6. Gallstones 7. Prostate gland enlargement and bladder diverticula. Electronically Signed   By: Signa Kell M.D.   On: 06/09/2016 13:53   Ct Cervical Spine Wo Contrast  Result Date: 06/09/2016 CLINICAL DATA:  Fall down stairs. Forehead abrasion, bloody nose, missing teeth. Initial encounter. EXAM: CT HEAD WITHOUT CONTRAST CT MAXILLOFACIAL WITHOUT CONTRAST CT CERVICAL SPINE WITHOUT CONTRAST TECHNIQUE: Multidetector CT imaging of the head, cervical spine, and maxillofacial structures were performed using the standard protocol without intravenous contrast. Multiplanar CT image reconstructions of the cervical spine and maxillofacial structures were also generated. COMPARISON:  None. FINDINGS: CT HEAD FINDINGS Brain: No evidence of acute infarction, hemorrhage, hydrocephalus, extra-axial collection or mass lesion/mass effect. Vascular: No hyperdense vessel or unexpected calcification. Skull: Normal. Negative for fracture or focal lesion. CT MAXILLOFACIAL FINDINGS Osseous: Remote left  superior orbital rim repair. No acute fracture. Very poor dentition with multiple devitalized teeth and periapical erosions. There is extensive loss of the alveolar ridge involving the mandible. Orbits: No posttraumatic finding.  Sinuses: Fluid levels in the right frontal sinus. Both maxillary sinuses are atelectatic and partially opacified. Soft tissues: No opaque foreign body or hematoma. CT CERVICAL SPINE FINDINGS Alignment: No traumatic malalignment. Skull base and vertebrae: Negative for acute fracture. Soft tissues and spinal canal: No prevertebral fluid or swelling. No visible canal hematoma. Disc levels: C5-6 and C6-7 uncovertebral spurring and foraminal narrowing. Upper chest: Biapical emphysema. IMPRESSION: 1. No evidence of intracranial or cervical spine injury. 2. Negative for facial fracture. 3. 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 A superimposed tooth fracture would not be readily detectable. 4. Sinusitis. Electronically Signed   By: Marnee Spring M.D.   On: 06/09/2016 13:54   Ct Abdomen Pelvis W Contrast  Result Date: 06/09/2016 CLINICAL DATA:  Larey Seat down stairs. EXAM: CT CHEST, ABDOMEN, AND PELVIS WITH CONTRAST TECHNIQUE: Multidetector CT imaging of the chest, abdomen and pelvis was performed following the standard protocol during bolus administration of intravenous contrast. CONTRAST:  ISOVUE-300 IOPAMIDOL (ISOVUE-300) INJECTION 61% COMPARISON:  None. FINDINGS: CT CHEST FINDINGS Cardiovascular: Normal heart size. No pericardial effusion. The thoracic aorta appears intact. Mediastinum/Nodes: The trachea appears patent and is midline. Normal appearance of the esophagus. Prominent mediastinal lymph nodes identified. Left paratracheal node measures 8 mm. 1.3 cm right hilar lymph node is identified. The 1 cm sub- carinal lymph node identified. Lungs/Pleura: No pleural effusion. Moderate to advanced changes of centrilobular and paraseptal emphysema. No pneumothorax or  pulmonary contusion. 6 mm anteromedial right upper lobe lung nodule is identified, image 40 of series 203. Dependent changes and subsegmental atelectasis noted in the lung bases. Musculoskeletal: There are acute nondisplaced right sixth through tenth rib fractures. No acute left rib fractures. The sternum appears intact. There is a fracture deformity involving the T4 vertebra nondisplaced scratch set mild nondisplaced fracture involving the inferior endplate of T4 is suspected. CT ABDOMEN PELVIS FINDINGS Hepatobiliary: Hemangioma within the posterior right lobe of liver measures 3.6 cm. Within segment 6 there is a larger hemangioma measuring 3.6 cm. The liver is otherwise intact without evidence for rupture. Stones identified within the neck of gallbladder. No biliary dilatation. Pancreas: Unremarkable. No pancreatic ductal dilatation or surrounding inflammatory changes. Spleen: Normal in size without focal abnormality. Adrenals/Urinary Tract: Adreniform enlargement of bilateral adrenal glands. No hemorrhage. The kidneys appear intact. Right kidney cyst noted. Several cystic structures are identified within the left side of pelvis. Favor large bladder diverticula. Urinary bladder otherwise appears normal. Stomach/Bowel: The stomach is normal. The small bowel loops have a normal course and caliber. The appendix is visualized and appears normal. No pathologic dilatation of the colon. Vascular/Lymphatic: Aortic atherosclerosis. No upper abdominal adenopathy. No pelvic or inguinal adenopathy. Reproductive:   Prostate gland is enlarged. Other: No free fluid or fluid collections. Musculoskeletal: Sclerotic lesion in the right iliac wing is favored to represent a benign bone island. 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 IMPRESSION: 1. Multiple acute right rib fracture deformities. There is also an age indeterminate T4 fracture deformity. 2. No evidence for  solid organ injury within the abdomen or pelvis. 3. Diffuse bronchial wall thickening with emphysema, as above; imaging findings suggestive of underlying COPD. 4. Pulmonary nodule in the right lung measures 6 mm. Non-contrast chest CT at 6-12 months is recommended. If the nodule is stable at time of repeat CT, then future CT at 18-24 months (from today's scan) is considered optional for low-risk patients, but is recommended for high-risk patients. This  recommendation follows the consensus statement: Guidelines for Management of Incidental Pulmonary Nodules Detected on CT Images:From the Fleischner Society 2017; published online before print (10.1148/radiol.[id number]). 5. Liver hemangiomas. 6. Gallstones 7. Prostate gland enlargement and bladder diverticula. Electronically Signed   By: Signa Kell M.D.   On: 06/09/2016 13:53   Dg Chest Port 1 View  Result Date: 06/10/2016 CLINICAL DATA:  Multiple right rib fractures. EXAM: PORTABLE CHEST 1 VIEW COMPARISON:  06/09/2016 FINDINGS: No pneumothorax. No effusion. The lungs are clear except for mild chronic appearing interstitial coarsening and mild linear scarring or atelectasis in the left base. Mediastinal contours are normal. No displaced fractures. IMPRESSION: Negative for pneumothorax or effusion. Mediastinal contours are normal. Moderate interstitial coarsening may be chronic. Electronically Signed   By: Ellery Plunk M.D.   On: 06/10/2016 06:13   Ct Maxillofacial Wo Contrast  Result Date: 06/09/2016 CLINICAL DATA:  Fall down stairs. Forehead abrasion, bloody nose, missing teeth. Initial encounter. EXAM: CT HEAD WITHOUT CONTRAST CT MAXILLOFACIAL WITHOUT CONTRAST CT CERVICAL SPINE WITHOUT CONTRAST TECHNIQUE: Multidetector CT imaging of the head, cervical spine, and maxillofacial structures were performed using the standard protocol without intravenous contrast. Multiplanar CT image reconstructions of the cervical spine and maxillofacial structures were  also generated. COMPARISON:  None. FINDINGS: CT HEAD FINDINGS Brain: No evidence of acute infarction, hemorrhage, hydrocephalus, extra-axial collection or mass lesion/mass effect. Vascular: No hyperdense vessel or unexpected calcification. Skull: Normal. Negative for fracture or focal lesion. CT MAXILLOFACIAL FINDINGS Osseous: Remote left superior orbital rim repair. No acute fracture. Very poor dentition with multiple devitalized teeth and periapical erosions. There is extensive loss of the alveolar ridge involving the mandible. Orbits: No posttraumatic finding. Sinuses: Fluid levels in the right frontal sinus. Both maxillary sinuses are atelectatic and partially opacified. Soft tissues: No opaque foreign body or hematoma. CT CERVICAL SPINE FINDINGS Alignment: No traumatic malalignment. Skull base and vertebrae: Negative for acute fracture. Soft tissues and spinal canal: No prevertebral fluid or swelling. No visible canal hematoma. Disc levels: C5-6 and C6-7 uncovertebral spurring and foraminal narrowing. Upper chest: Biapical emphysema. IMPRESSION: 1. No evidence of intracranial or cervical spine injury. 2. Negative for facial fracture. 3. 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 A superimposed tooth fracture would not be readily detectable. 4. Sinusitis. Electronically Signed   By: Marnee Spring M.D.   On: 06/09/2016 13:54    Anti-infectives: Anti-infectives    Start     Dose/Rate Route Frequency Ordered Stop   06/09/16 1545  amoxicillin-clavulanate (AUGMENTIN) 875-125 MG per tablet 1 tablet     1 tablet Oral Every 12 hours 06/09/16 1539        Assessment/Plan: s/p  Continue cardiac monitoring.  KVO IVFs CXR today is good and I do not need to order another one. It is conceivable that he may had had an episode of strial fibrillation at home that led to his syncope.  LOS: 1 day   Marta Lamas. Gae Bon, MD, FACS [phone] Trauma Surgeon 06/10/2016

## 2016-06-10 NOTE — Progress Notes (Signed)
Brief hx: Mr. Sommers had a syncope episode and fell down steps and sustained traumatic injuries. Trauma team is attending. [facility] was consulted for syncope and general medical mngt. TSH normal. Pt for echo today. Workup in progress.  Around 0600 this am, RN paged because pt was having pauses which have become more frequently over the night and he has brady'd down this am to the 30s, non sustained. He felt SOB which has improved. No arm or jaw pain. No diaphoresis or n/v. Stated he had "indigestion" and RN gave Maalox without relief.  EKG OK except sinus brady. Gave NTG and pt said the "indigestion" was improved.  Gave pt ASA . On O2. Getting troponins. Troponins normal in ED.   Spoke to RN about plan. Pt in no distress. Will report off to [facility] doc at 0700.  KJKG, NP [facility]

## 2016-06-11 ENCOUNTER — Inpatient Hospital Stay (HOSPITAL_COMMUNITY): Payer: Medicaid Other

## 2016-06-11 DIAGNOSIS — Z720 Tobacco use: Secondary | ICD-10-CM

## 2016-06-11 DIAGNOSIS — R55 Syncope and collapse: Secondary | ICD-10-CM

## 2016-06-11 LAB — ECHOCARDIOGRAM COMPLETE
AVLVOTPG: 5 mmHg
E decel time: 209 msec
E/e' ratio: 7.5
FS: 35 % (ref 28–44)
Height: 69 in
IVS/LV PW RATIO, ED: 0.9
LA ID, A-P, ES: 32 mm
LA vol: 58.2 mL
LADIAMINDEX: 1.61 cm/m2
LAVOLA4C: 55 mL
LAVOLIN: 29.2 mL/m2
LDCA: 4.52 cm2
LEFT ATRIUM END SYS DIAM: 32 mm
LV E/e' medial: 7.5
LV E/e'average: 7.5
LV TDI E'MEDIAL: 9.51
LV e' LATERAL: 11.9 cm/s
LVOT VTI: 22.4 cm
LVOT peak vel: 116 cm/s
LVOTD: 24 mm
LVOTSV: 101 mL
MV Dec: 209
MVPG: 3 mmHg
MVPKAVEL: 81 m/s
MVPKEVEL: 89.2 m/s
PW: 10.5 mm — AB (ref 0.6–1.1)
RV LATERAL S' VELOCITY: 16 cm/s
TAPSE: 17.5 mm
TDI e' lateral: 11.9
Weight: 2857.16 oz

## 2016-06-11 LAB — BASIC METABOLIC PANEL
ANION GAP: 9 (ref 5–15)
BUN: 15 mg/dL (ref 6–20)
CALCIUM: 8.5 mg/dL — AB (ref 8.9–10.3)
CO2: 28 mmol/L (ref 22–32)
Chloride: 98 mmol/L — ABNORMAL LOW (ref 101–111)
Creatinine, Ser: 0.95 mg/dL (ref 0.61–1.24)
Glucose, Bld: 111 mg/dL — ABNORMAL HIGH (ref 65–99)
Potassium: 4.3 mmol/L (ref 3.5–5.1)
SODIUM: 135 mmol/L (ref 135–145)

## 2016-06-11 LAB — CBC WITH DIFFERENTIAL/PLATELET
BASOS ABS: 0 10*3/uL (ref 0.0–0.1)
BASOS PCT: 0 %
EOS PCT: 0 %
Eosinophils Absolute: 0 10*3/uL (ref 0.0–0.7)
HCT: 40.8 % (ref 39.0–52.0)
Hemoglobin: 13.4 g/dL (ref 13.0–17.0)
Lymphocytes Relative: 12 %
Lymphs Abs: 1 10*3/uL (ref 0.7–4.0)
MCH: 32.4 pg (ref 26.0–34.0)
MCHC: 32.8 g/dL (ref 30.0–36.0)
MCV: 98.6 fL (ref 78.0–100.0)
Monocytes Absolute: 0.8 10*3/uL (ref 0.1–1.0)
Monocytes Relative: 10 %
Neutro Abs: 6.4 10*3/uL (ref 1.7–7.7)
Neutrophils Relative %: 78 %
PLATELETS: 148 10*3/uL — AB (ref 150–400)
RBC: 4.14 MIL/uL — ABNORMAL LOW (ref 4.22–5.81)
RDW: 13.3 % (ref 11.5–15.5)
WBC: 8.2 10*3/uL (ref 4.0–10.5)

## 2016-06-11 LAB — GLUCOSE, CAPILLARY
GLUCOSE-CAPILLARY: 103 mg/dL — AB (ref 65–99)
Glucose-Capillary: 101 mg/dL — ABNORMAL HIGH (ref 65–99)

## 2016-06-11 LAB — LIPID PANEL
CHOLESTEROL: 130 mg/dL (ref 0–200)
HDL: 32 mg/dL — AB (ref >40)
LDL CALC: 90 mg/dL (ref 0–99)
TRIGLYCERIDES: 42 mg/dL (ref <150)
Total CHOL/HDL Ratio: 4.1 RATIO
VLDL: 8 mg/dL (ref 0–40)

## 2016-06-11 LAB — NM MYOCAR MULTI W/SPECT W/WALL MOTION / EF
CHL CUP RESTING HR STRESS: 78 {beats}/min
CSEPPHR: 91 {beats}/min

## 2016-06-11 MED ORDER — TECHNETIUM TC 99M TETROFOSMIN IV KIT
30.0000 | PACK | Freq: Once | INTRAVENOUS | Status: AC
  Administered 2016-06-11: 30 via INTRAVENOUS

## 2016-06-11 MED ORDER — METHOCARBAMOL 500 MG PO TABS
750.0000 mg | ORAL_TABLET | Freq: Three times a day (TID) | ORAL | Status: DC
  Administered 2016-06-11 – 2016-06-13 (×9): 750 mg via ORAL
  Filled 2016-06-11 (×10): qty 2

## 2016-06-11 MED ORDER — DILTIAZEM HCL ER COATED BEADS 120 MG PO CP24
120.0000 mg | ORAL_CAPSULE | Freq: Every day | ORAL | Status: DC
Start: 2016-06-11 — End: 2016-06-15
  Administered 2016-06-11 – 2016-06-15 (×5): 120 mg via ORAL
  Filled 2016-06-11 (×5): qty 1

## 2016-06-11 MED ORDER — REGADENOSON 0.4 MG/5ML IV SOLN
INTRAVENOUS | Status: AC
  Filled 2016-06-11: qty 5

## 2016-06-11 MED ORDER — SENNOSIDES-DOCUSATE SODIUM 8.6-50 MG PO TABS
1.0000 | ORAL_TABLET | Freq: Two times a day (BID) | ORAL | Status: DC
  Administered 2016-06-11 – 2016-06-15 (×9): 1 via ORAL
  Filled 2016-06-11 (×9): qty 1

## 2016-06-11 MED ORDER — KETOROLAC TROMETHAMINE 30 MG/ML IJ SOLN: 15.0000 mg | Freq: Four times a day (QID) | INTRAMUSCULAR | Status: DC | PRN

## 2016-06-11 MED ORDER — DILTIAZEM HCL-DEXTROSE 100-5 MG/100ML-% IV SOLN (PREMIX): 5.0000 mg/h | INTRAVENOUS | Status: DC

## 2016-06-11 MED ORDER — REGADENOSON 0.4 MG/5ML IV SOLN
0.4000 mg | Freq: Once | INTRAVENOUS | Status: AC
  Administered 2016-06-11: 0.4 mg via INTRAVENOUS

## 2016-06-11 MED ORDER — TECHNETIUM TC 99M TETROFOSMIN IV KIT
10.0000 | PACK | Freq: Once | INTRAVENOUS | Status: AC
  Administered 2016-06-11: 10 via INTRAVENOUS

## 2016-06-11 MED ORDER — IBUPROFEN 200 MG PO TABS
600.0000 mg | ORAL_TABLET | Freq: Three times a day (TID) | ORAL | Status: DC
  Administered 2016-06-11 – 2016-06-14 (×11): 600 mg via ORAL
  Filled 2016-06-11 (×11): qty 3

## 2016-06-11 MED ORDER — DILTIAZEM LOAD VIA INFUSION
10.0000 mg | Freq: Once | INTRAVENOUS | Status: DC
  Filled 2016-06-11: qty 10

## 2016-06-11 NOTE — Progress Notes (Signed)
  Nuclear stress test results from 06/11/2016: IMPRESSION: 1. No reversible ischemia or infarction. 2. Normal left ventricular wall motion. 3. Left ventricular ejection fraction 76% 4. Non invasive risk stratification*: Low  Echocardiogram results from 06/11/2016: - Left ventricle: The cavity size was normal. Systolic function was   normal. The estimated ejection fraction was in the range of 60%   to 65%. Wall motion was normal; there were no regional wall   motion abnormalities. Left ventricular diastolic function   parameters were normal. - Aortic valve: There was no regurgitation. - Aortic root: The aortic root was normal in size. - Mitral valve: Structurally normal valve. - Left atrium: The atrium was normal in size. - Right ventricle: Systolic function was normal. - Tricuspid valve: There was trivial regurgitation. - Pulmonic valve: There was no regurgitation. - Pulmonary arteries: Systolic pressure was within the normal   range. - Inferior vena cava: The vessel was normal in size. - Pericardium, extracardiac: There was no pericardial effusion.  Discussed with patient. We will see again tomorrow. Tereso Newcomer, PA-C    06/11/2016 4:36 PM

## 2016-06-11 NOTE — Progress Notes (Signed)
Trauma Service Note  Subjective: Patient just leaving the floor for cardiac stress test.  Having a lot of difficulty breathing.  Went into AF last night and spontaneously converted.  Objective: Vital signs in last 24 hours: Temp:  [98 F (36.7 C)-98.8 F (37.1 C)] 98.2 F (36.8 C) (04/07 0804) Pulse Rate:  [74-98] 83 (04/07 0804) Resp:  [18-23] 20 (04/07 0804) BP: (119-141)/(77-97) 141/96 (04/07 0804) SpO2:  [89 %-99 %] 91 % (04/07 0804) Weight:  [81 kg (178 lb 9.2 oz)] 81 kg (178 lb 9.2 oz) (04/07 0507) Last BM Date:  (PTA)  Intake/Output from previous day: 04/06 0701 - 04/07 0700 In: 790 [P.O.:240; I.V.:550] Out: 1800 [Urine:1800] Intake/Output this shift: No intake/output data recorded.  General: Moderate acute distress with breathing and coughing.    Oxygen saturations in the low nineties.  IS not at bedside.  Lungs: Clear to auscultation.  No CXR today.  Yesterday's CXR was good.  Abd: Benign  Extremities: No changes  Neuro: Intact  Lab Results: CBC   Recent Labs  06/10/16 0232 06/11/16 0210  WBC 9.0 8.2  HGB 14.6 13.4  HCT 43.5 40.8  PLT 187 148*   BMET  Recent Labs  06/10/16 0232 06/11/16 0210  NA 135 135  K 4.2 4.3  CL 99* 98*  CO2 27 28  GLUCOSE 112* 111*  BUN 12 15  CREATININE 0.90 0.95  CALCIUM 8.8* 8.5*   PT/INR No results for input(s): LABPROT, INR in the last 72 hours. ABG No results for input(s): PHART, HCO3 in the last 72 hours.  Invalid input(s): PCO2, PO2  Studies/Results: Ct Head Wo Contrast  Result Date: 06/09/2016 CLINICAL DATA:  Fall down stairs. Forehead abrasion, bloody nose, missing teeth. Initial encounter. EXAM: CT HEAD WITHOUT CONTRAST CT MAXILLOFACIAL WITHOUT CONTRAST CT CERVICAL SPINE WITHOUT CONTRAST TECHNIQUE: Multidetector CT imaging of the head, cervical spine, and maxillofacial structures were performed using the standard protocol without intravenous contrast. Multiplanar CT image reconstructions of the cervical  spine and maxillofacial structures were also generated. COMPARISON:  None. FINDINGS: CT HEAD FINDINGS Brain: No evidence of acute infarction, hemorrhage, hydrocephalus, extra-axial collection or mass lesion/mass effect. Vascular: No hyperdense vessel or unexpected calcification. Skull: Normal. Negative for fracture or focal lesion. CT MAXILLOFACIAL FINDINGS Osseous: Remote left superior orbital rim repair. No acute fracture. Very poor dentition with multiple devitalized teeth and periapical erosions. There is extensive loss of the alveolar ridge involving the mandible. Orbits: No posttraumatic finding. Sinuses: Fluid levels in the right frontal sinus. Both maxillary sinuses are atelectatic and partially opacified. Soft tissues: No opaque foreign body or hematoma. CT CERVICAL SPINE FINDINGS Alignment: No traumatic malalignment. Skull base and vertebrae: Negative for acute fracture. Soft tissues and spinal canal: No prevertebral fluid or swelling. No visible canal hematoma. Disc levels: C5-6 and C6-7 uncovertebral spurring and foraminal narrowing. Upper chest: Biapical emphysema. IMPRESSION: 1. No evidence of intracranial or cervical spine injury. 2. Negative for facial fracture. 3. 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 A superimposed tooth fracture would not be readily detectable. 4. Sinusitis. Electronically Signed   By: Marnee Spring M.D.   On: 06/09/2016 13:54   Ct Chest W Contrast  Result Date: 06/09/2016 CLINICAL DATA:  Larey Seat down stairs. EXAM: CT CHEST, ABDOMEN, AND PELVIS WITH CONTRAST TECHNIQUE: Multidetector CT imaging of the chest, abdomen and pelvis was performed following the standard protocol during bolus administration of intravenous contrast. CONTRAST:  ISOVUE-300 IOPAMIDOL (ISOVUE-300) INJECTION 61% COMPARISON:  None. FINDINGS: CT CHEST  FINDINGS Cardiovascular: Normal heart size. No pericardial effusion. The thoracic aorta appears intact. Mediastinum/Nodes: The  trachea appears patent and is midline. Normal appearance of the esophagus. Prominent mediastinal lymph nodes identified. Left paratracheal node measures 8 mm. 1.3 cm right hilar lymph node is identified. The 1 cm sub- carinal lymph node identified. Lungs/Pleura: No pleural effusion. Moderate to advanced changes of centrilobular and paraseptal emphysema. No pneumothorax or pulmonary contusion. 6 mm anteromedial right upper lobe lung nodule is identified, image 40 of series 203. Dependent changes and subsegmental atelectasis noted in the lung bases. Musculoskeletal: There are acute nondisplaced right sixth through tenth rib fractures. No acute left rib fractures. The sternum appears intact. There is a fracture deformity involving the T4 vertebra nondisplaced scratch set mild nondisplaced fracture involving the inferior endplate of T4 is suspected. CT ABDOMEN PELVIS FINDINGS Hepatobiliary: Hemangioma within the posterior right lobe of liver measures 3.6 cm. Within segment 6 there is a larger hemangioma measuring 3.6 cm. The liver is otherwise intact without evidence for rupture. Stones identified within the neck of gallbladder. No biliary dilatation. Pancreas: Unremarkable. No pancreatic ductal dilatation or surrounding inflammatory changes. Spleen: Normal in size without focal abnormality. Adrenals/Urinary Tract: Adreniform enlargement of bilateral adrenal glands. No hemorrhage. The kidneys appear intact. Right kidney cyst noted. Several cystic structures are identified within the left side of pelvis. Favor large bladder diverticula. Urinary bladder otherwise appears normal. Stomach/Bowel: The stomach is normal. The small bowel loops have a normal course and caliber. The appendix is visualized and appears normal. No pathologic dilatation of the colon. Vascular/Lymphatic: Aortic atherosclerosis. No upper abdominal adenopathy. No pelvic or inguinal adenopathy. Reproductive:   Prostate gland is enlarged. Other: No free  fluid or fluid collections. Musculoskeletal: Sclerotic lesion in the right iliac wing is favored to represent a benign bone island. 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 IMPRESSION: 1. Multiple acute right rib fracture deformities. There is also an age indeterminate T4 fracture deformity. 2. No evidence for solid organ injury within the abdomen or pelvis. 3. Diffuse bronchial wall thickening with emphysema, as above; imaging findings suggestive of underlying COPD. 4. Pulmonary nodule in the right lung measures 6 mm. Non-contrast chest CT at 6-12 months is recommended. If the nodule is stable at time of repeat CT, then future CT at 18-24 months (from today's scan) is considered optional for low-risk patients, but is recommended for high-risk patients. This recommendation follows the consensus statement: Guidelines for Management of Incidental Pulmonary Nodules Detected on CT Images:From the Fleischner Society 2017; published online before print (10.1148/[id number]). 5. Liver hemangiomas. 6. Gallstones 7. Prostate gland enlargement and bladder diverticula. Electronically Signed   By: Signa Kell M.D.   On: 06/09/2016 13:53   Ct Cervical Spine Wo Contrast  Result Date: 06/09/2016 CLINICAL DATA:  Fall down stairs. Forehead abrasion, bloody nose, missing teeth. Initial encounter. EXAM: CT HEAD WITHOUT CONTRAST CT MAXILLOFACIAL WITHOUT CONTRAST CT CERVICAL SPINE WITHOUT CONTRAST TECHNIQUE: Multidetector CT imaging of the head, cervical spine, and maxillofacial structures were performed using the standard protocol without intravenous contrast. Multiplanar CT image reconstructions of the cervical spine and maxillofacial structures were also generated. COMPARISON:  None. FINDINGS: CT HEAD FINDINGS Brain: No evidence of acute infarction, hemorrhage, hydrocephalus, extra-axial collection or mass lesion/mass effect. Vascular: No hyperdense vessel or  unexpected calcification. Skull: Normal. Negative for fracture or focal lesion. CT MAXILLOFACIAL FINDINGS Osseous: Remote left superior orbital rim repair. No acute fracture. Very poor dentition  with multiple devitalized teeth and periapical erosions. There is extensive loss of the alveolar ridge involving the mandible. Orbits: No posttraumatic finding. Sinuses: Fluid levels in the right frontal sinus. Both maxillary sinuses are atelectatic and partially opacified. Soft tissues: No opaque foreign body or hematoma. CT CERVICAL SPINE FINDINGS Alignment: No traumatic malalignment. Skull base and vertebrae: Negative for acute fracture. Soft tissues and spinal canal: No prevertebral fluid or swelling. No visible canal hematoma. Disc levels: C5-6 and C6-7 uncovertebral spurring and foraminal narrowing. Upper chest: Biapical emphysema. IMPRESSION: 1. No evidence of intracranial or cervical spine injury. 2. Negative for facial fracture. 3. 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 A superimposed tooth fracture would not be readily detectable. 4. Sinusitis. Electronically Signed   By: Marnee Spring M.D.   On: 06/09/2016 13:54   Ct Abdomen Pelvis W Contrast  Result Date: 06/09/2016 CLINICAL DATA:  Larey Seat down stairs. EXAM: CT CHEST, ABDOMEN, AND PELVIS WITH CONTRAST TECHNIQUE: Multidetector CT imaging of the chest, abdomen and pelvis was performed following the standard protocol during bolus administration of intravenous contrast. CONTRAST:  ISOVUE-300 IOPAMIDOL (ISOVUE-300) INJECTION 61% COMPARISON:  None. FINDINGS: CT CHEST FINDINGS Cardiovascular: Normal heart size. No pericardial effusion. The thoracic aorta appears intact. Mediastinum/Nodes: The trachea appears patent and is midline. Normal appearance of the esophagus. Prominent mediastinal lymph nodes identified. Left paratracheal node measures 8 mm. 1.3 cm right hilar lymph node is identified. The 1 cm sub- carinal lymph node identified.  Lungs/Pleura: No pleural effusion. Moderate to advanced changes of centrilobular and paraseptal emphysema. No pneumothorax or pulmonary contusion. 6 mm anteromedial right upper lobe lung nodule is identified, image 40 of series 203. Dependent changes and subsegmental atelectasis noted in the lung bases. Musculoskeletal: There are acute nondisplaced right sixth through tenth rib fractures. No acute left rib fractures. The sternum appears intact. There is a fracture deformity involving the T4 vertebra nondisplaced scratch set mild nondisplaced fracture involving the inferior endplate of T4 is suspected. CT ABDOMEN PELVIS FINDINGS Hepatobiliary: Hemangioma within the posterior right lobe of liver measures 3.6 cm. Within segment 6 there is a larger hemangioma measuring 3.6 cm. The liver is otherwise intact without evidence for rupture. Stones identified within the neck of gallbladder. No biliary dilatation. Pancreas: Unremarkable. No pancreatic ductal dilatation or surrounding inflammatory changes. Spleen: Normal in size without focal abnormality. Adrenals/Urinary Tract: Adreniform enlargement of bilateral adrenal glands. No hemorrhage. The kidneys appear intact. Right kidney cyst noted. Several cystic structures are identified within the left side of pelvis. Favor large bladder diverticula. Urinary bladder otherwise appears normal. Stomach/Bowel: The stomach is normal. The small bowel loops have a normal course and caliber. The appendix is visualized and appears normal. No pathologic dilatation of the colon. Vascular/Lymphatic: Aortic atherosclerosis. No upper abdominal adenopathy. No pelvic or inguinal adenopathy. Reproductive:   Prostate gland is enlarged. Other: No free fluid or fluid collections. Musculoskeletal: Sclerotic lesion in the right iliac wing is favored to represent a benign bone island. 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 IMPRESSION: 1.  Multiple acute right rib fracture deformities. There is also an age indeterminate T4 fracture deformity. 2. No evidence for solid organ injury within the abdomen or pelvis. 3. Diffuse bronchial wall thickening with emphysema, as above; imaging findings suggestive of underlying COPD. 4. Pulmonary nodule in the right lung measures 6 mm. Non-contrast chest CT at 6-12 months is recommended. If the nodule is stable at time of repeat CT,  then future CT at 18-24 months (from today's scan) is considered optional for low-risk patients, but is recommended for high-risk patients. This recommendation follows the consensus statement: Guidelines for Management of Incidental Pulmonary Nodules Detected on CT Images:From the Fleischner Society 2017; published online before print (10.1148/radiol.[id number]). 5. Liver hemangiomas. 6. Gallstones 7. Prostate gland enlargement and bladder diverticula. Electronically Signed   By: Signa Kell M.D.   On: 06/09/2016 13:53   Dg Chest Port 1 View  Result Date: 06/10/2016 CLINICAL DATA:  Multiple right rib fractures. EXAM: PORTABLE CHEST 1 VIEW COMPARISON:  06/09/2016 FINDINGS: No pneumothorax. No effusion. The lungs are clear except for mild chronic appearing interstitial coarsening and mild linear scarring or atelectasis in the left base. Mediastinal contours are normal. No displaced fractures. IMPRESSION: Negative for pneumothorax or effusion. Mediastinal contours are normal. Moderate interstitial coarsening may be chronic. Electronically Signed   By: Ellery Plunk M.D.   On: 06/10/2016 06:13   Ct Maxillofacial Wo Contrast  Result Date: 06/09/2016 CLINICAL DATA:  Fall down stairs. Forehead abrasion, bloody nose, missing teeth. Initial encounter. EXAM: CT HEAD WITHOUT CONTRAST CT MAXILLOFACIAL WITHOUT CONTRAST CT CERVICAL SPINE WITHOUT CONTRAST TECHNIQUE: Multidetector CT imaging of the head, cervical spine, and maxillofacial structures were performed using the standard protocol  without intravenous contrast. Multiplanar CT image reconstructions of the cervical spine and maxillofacial structures were also generated. COMPARISON:  None. FINDINGS: CT HEAD FINDINGS Brain: No evidence of acute infarction, hemorrhage, hydrocephalus, extra-axial collection or mass lesion/mass effect. Vascular: No hyperdense vessel or unexpected calcification. Skull: Normal. Negative for fracture or focal lesion. CT MAXILLOFACIAL FINDINGS Osseous: Remote left superior orbital rim repair. No acute fracture. Very poor dentition with multiple devitalized teeth and periapical erosions. There is extensive loss of the alveolar ridge involving the mandible. Orbits: No posttraumatic finding. Sinuses: Fluid levels in the right frontal sinus. Both maxillary sinuses are atelectatic and partially opacified. Soft tissues: No opaque foreign body or hematoma. CT CERVICAL SPINE FINDINGS Alignment: No traumatic malalignment. Skull base and vertebrae: Negative for acute fracture. Soft tissues and spinal canal: No prevertebral fluid or swelling. No visible canal hematoma. Disc levels: C5-6 and C6-7 uncovertebral spurring and foraminal narrowing. Upper chest: Biapical emphysema. IMPRESSION: 1. No evidence of intracranial or cervical spine injury. 2. Negative for facial fracture. 3. 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 A superimposed tooth fracture would not be readily detectable. 4. Sinusitis. Electronically Signed   By: Marnee Spring M.D.   On: 06/09/2016 13:54    Anti-infectives: Anti-infectives    Start     Dose/Rate Route Frequency Ordered Stop   06/09/16 1545  amoxicillin-clavulanate (AUGMENTIN) 875-125 MG per tablet 1 tablet     1 tablet Oral Every 12 hours 06/09/16 1539        Assessment/Plan: s/p  Going for cardiac catheterization today.  Went into AF yesterday evening and spontaneously converted. Get IS at the bedside. No other changes  LOS: 2 days   Marta Lamas. Gae Bon, MD,  FACS [phone] Trauma Surgeon 06/11/2016

## 2016-06-11 NOTE — Progress Notes (Signed)
Progress Note  Patient Name: Patrick Kelly Date of Encounter: 06/11/2016  Primary Cardiologist: Dr. Dietrich Pates   Subjective   Patient seen in Nuclear Medicine for his stress test.  No chest pain, shortness of breath, dizziness.   Inpatient Medications    Scheduled Meds: . amoxicillin-clavulanate  1 tablet Oral Q12H  . enoxaparin (LOVENOX) injection  40 mg Subcutaneous Q24H  . ibuprofen  600 mg Oral TID  . methocarbamol  750 mg Oral TID  . mometasone-formoterol  2 puff Inhalation BID  . pantoprazole  40 mg Oral Daily   Or  . pantoprazole (PROTONIX) IV  40 mg Intravenous Daily  . regadenoson  0.4 mg Intravenous Once  . senna-docusate  1 tablet Oral BID  . sodium chloride flush  3 mL Intravenous Q12H  . tiotropium  18 mcg Inhalation Daily   Continuous Infusions: . dextrose 5 % and 0.45 % NaCl with KCl 20 mEq/L 50 mL/hr at 06/10/16 2330   PRN Meds: acetaminophen, alum & mag hydroxide-simeth, hydrALAZINE, HYDROmorphone (DILAUDID) injection, levalbuterol, nitroGLYCERIN, ondansetron **OR** ondansetron (ZOFRAN) IV, oxyCODONE   Vital Signs    Vitals:   06/11/16 0331 06/11/16 0507 06/11/16 0738 06/11/16 0804  BP: 131/90   (!) 141/96  Pulse: 83   83  Resp: 19   20  Temp: 98.5 F (36.9 C)   98.2 F (36.8 C)  TempSrc: Oral   Oral  SpO2: 90%  (!) 89% 91%  Weight:  178 lb 9.2 oz (81 kg)    Height:        Intake/Output Summary (Last 24 hours) at 06/11/16 1121 Last data filed at 06/11/16 1000  Gross per 24 hour  Intake              990 ml  Output             2250 ml  Net            -1260 ml   Filed Weights   06/10/16 0316 06/11/16 0507  Weight: 175 lb 7.8 oz (79.6 kg) 178 lb 9.2 oz (81 kg)    Telemetry    AFib with RVR HR 140s >> NSR (no pauses noted on available strips in EMR) - Personally Reviewed  ECG    ECG in Nuc Med reviewed: NSR, HR 78, no acute ST changes - Personally Reviewed  Physical Exam   GEN: No acute distress.   Neck: No JVD Cardiac: RRR,  no murmurs   Respiratory: Clear to auscultation bilaterally. GI: Soft, nontender MS: No edema  Neuro:  Nonfocal  Psych: Normal affect   Labs    Chemistry Recent Labs Lab 06/09/16 1156 06/10/16 0232 06/11/16 0210  NA 134* 135 135  K 4.0 4.2 4.3  CL 102 99* 98*  CO2 GLUCOSE 91 112* 111*  BUN CREATININE 0.97 0.90 0.95  CALCIUM 8.8* 8.8* 8.5*  PROT 6.3*  --   --   ALBUMIN 3.4*  --   --   AST 19  --   --   ALT 7*  --   --   ALKPHOS 89  --   --   BILITOT 0.9  --   --   GFRNONAA >60 >60 >60  GFRAA >60 >60 >60  ANIONGAP Hematology Recent Labs Lab 06/09/16 1156 06/10/16 0232 06/11/16 0210  WBC 12.6* 9.0 8.2  RBC 4.42 4.43 4.14*  HGB 14.7 14.6 13.4  HCT 42.9 43.5 40.8  MCV 97.1 98.2 98.6  MCH 33.3 33.0 32.4  MCHC 34.3 33.6 32.8  RDW 13.0 13.4 13.3  PLT 172 187 148*    Cardiac Enzymes Recent Labs Lab 06/09/16 2001 06/10/16 0650 06/10/16 1217 06/10/16 1850  TROPONINI <0.03 <0.03 <0.03 <0.03    Recent Labs Lab 06/09/16 1215 06/09/16 1624 06/09/16 1841  TROPIPOC 0.00 0.01 0.00     BNPNo results for input(s): BNP, PROBNP in the last 168 hours.   DDimer No results for input(s): DDIMER in the last 168 hours.   Radiology    Ct Head Wo Contrast  Result Date: 06/09/2016 CLINICAL DATA:  Fall down stairs. Forehead abrasion, bloody nose, missing teeth. Initial encounter. EXAM: CT HEAD WITHOUT CONTRAST CT MAXILLOFACIAL WITHOUT CONTRAST CT CERVICAL SPINE WITHOUT CONTRAST TECHNIQUE: Multidetector CT imaging of the head, cervical spine, and maxillofacial structures were performed using the standard protocol without intravenous contrast. Multiplanar CT image reconstructions of the cervical spine and maxillofacial structures were also generated. COMPARISON:  None. FINDINGS: CT HEAD FINDINGS Brain: No evidence of acute infarction, hemorrhage, hydrocephalus, extra-axial collection or mass lesion/mass effect. Vascular: No hyperdense vessel or  unexpected calcification. Skull: Normal. Negative for fracture or focal lesion. CT MAXILLOFACIAL FINDINGS Osseous: Remote left superior orbital rim repair. No acute fracture. Very poor dentition with multiple devitalized teeth and periapical erosions. There is extensive loss of the alveolar ridge involving the mandible. Orbits: No posttraumatic finding. Sinuses: Fluid levels in the right frontal sinus. Both maxillary sinuses are atelectatic and partially opacified. Soft tissues: No opaque foreign body or hematoma. CT CERVICAL SPINE FINDINGS Alignment: No traumatic malalignment. Skull base and vertebrae: Negative for acute fracture. Soft tissues and spinal canal: No prevertebral fluid or swelling. No visible canal hematoma. Disc levels: C5-6 and C6-7 uncovertebral spurring and foraminal narrowing. Upper chest: Biapical emphysema. IMPRESSION: 1. No evidence of intracranial or cervical spine injury. 2. Negative for facial fracture. 3. 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 A superimposed tooth fracture would not be readily detectable. 4. Sinusitis. Electronically Signed   By: Marnee Spring M.D.   On: 06/09/2016 13:54   Ct Chest W Contrast  Result Date: 06/09/2016 CLINICAL DATA:  Larey Seat down stairs. EXAM: CT CHEST, ABDOMEN, AND PELVIS WITH CONTRAST TECHNIQUE: Multidetector CT imaging of the chest, abdomen and pelvis was performed following the standard protocol during bolus administration of intravenous contrast. CONTRAST:  ISOVUE-300 IOPAMIDOL (ISOVUE-300) INJECTION 61% COMPARISON:  None. FINDINGS: CT CHEST FINDINGS Cardiovascular: Normal heart size. No pericardial effusion. The thoracic aorta appears intact. Mediastinum/Nodes: The trachea appears patent and is midline. Normal appearance of the esophagus. Prominent mediastinal lymph nodes identified. Left paratracheal node measures 8 mm. 1.3 cm right hilar lymph node is identified. The 1 cm sub- carinal lymph node identified.  Lungs/Pleura: No pleural effusion. Moderate to advanced changes of centrilobular and paraseptal emphysema. No pneumothorax or pulmonary contusion. 6 mm anteromedial right upper lobe lung nodule is identified, image 40 of series 203. Dependent changes and subsegmental atelectasis noted in the lung bases. Musculoskeletal: There are acute nondisplaced right sixth through tenth rib fractures. No acute left rib fractures. The sternum appears intact. There is a fracture deformity involving the T4 vertebra nondisplaced scratch set mild nondisplaced fracture involving the inferior endplate of T4 is suspected. CT ABDOMEN PELVIS FINDINGS Hepatobiliary: Hemangioma within the posterior right lobe of liver measures 3.6 cm. Within segment 6 there is a larger hemangioma measuring 3.6 cm. The liver is otherwise intact without evidence for  rupture. Stones identified within the neck of gallbladder. No biliary dilatation. Pancreas: Unremarkable. No pancreatic ductal dilatation or surrounding inflammatory changes. Spleen: Normal in size without focal abnormality. Adrenals/Urinary Tract: Adreniform enlargement of bilateral adrenal glands. No hemorrhage. The kidneys appear intact. Right kidney cyst noted. Several cystic structures are identified within the left side of pelvis. Favor large bladder diverticula. Urinary bladder otherwise appears normal. Stomach/Bowel: The stomach is normal. The small bowel loops have a normal course and caliber. The appendix is visualized and appears normal. No pathologic dilatation of the colon. Vascular/Lymphatic: Aortic atherosclerosis. No upper abdominal adenopathy. No pelvic or inguinal adenopathy. Reproductive:   Prostate gland is enlarged. Other: No free fluid or fluid collections. Musculoskeletal: Sclerotic lesion in the right iliac wing is favored to represent a benign bone island. 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 IMPRESSION: 1.  Multiple acute right rib fracture deformities. There is also an age indeterminate T4 fracture deformity. 2. No evidence for solid organ injury within the abdomen or pelvis. 3. Diffuse bronchial wall thickening with emphysema, as above; imaging findings suggestive of underlying COPD. 4. Pulmonary nodule in the right lung measures 6 mm. Non-contrast chest CT at 6-12 months is recommended. If the nodule is stable at time of repeat CT, then future CT at 18-24 months (from today's scan) is considered optional for low-risk patients, but is recommended for high-risk patients. This recommendation follows the consensus statement: Guidelines for Management of Incidental Pulmonary Nodules Detected on CT Images:From the Fleischner Society 2017; published online before print (10.1148/[id number]). 5. Liver hemangiomas. 6. Gallstones 7. Prostate gland enlargement and bladder diverticula. Electronically Signed   By: Signa Kell M.D.   On: 06/09/2016 13:53   Ct Cervical Spine Wo Contrast  Result Date: 06/09/2016 CLINICAL DATA:  Fall down stairs. Forehead abrasion, bloody nose, missing teeth. Initial encounter. EXAM: CT HEAD WITHOUT CONTRAST CT MAXILLOFACIAL WITHOUT CONTRAST CT CERVICAL SPINE WITHOUT CONTRAST TECHNIQUE: Multidetector CT imaging of the head, cervical spine, and maxillofacial structures were performed using the standard protocol without intravenous contrast. Multiplanar CT image reconstructions of the cervical spine and maxillofacial structures were also generated. COMPARISON:  None. FINDINGS: CT HEAD FINDINGS Brain: No evidence of acute infarction, hemorrhage, hydrocephalus, extra-axial collection or mass lesion/mass effect. Vascular: No hyperdense vessel or unexpected calcification. Skull: Normal. Negative for fracture or focal lesion. CT MAXILLOFACIAL FINDINGS Osseous: Remote left superior orbital rim repair. No acute fracture. Very poor dentition with multiple devitalized teeth and periapical erosions.  There is extensive loss of the alveolar ridge involving the mandible. Orbits: No posttraumatic finding. Sinuses: Fluid levels in the right frontal sinus. Both maxillary sinuses are atelectatic and partially opacified. Soft tissues: No opaque foreign body or hematoma. CT CERVICAL SPINE FINDINGS Alignment: No traumatic malalignment. Skull base and vertebrae: Negative for acute fracture. Soft tissues and spinal canal: No prevertebral fluid or swelling. No visible canal hematoma. Disc levels: C5-6 and C6-7 uncovertebral spurring and foraminal narrowing. Upper chest: Biapical emphysema. IMPRESSION: 1. No evidence of intracranial or cervical spine injury. 2. Negative for facial fracture. 3. 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 A superimposed tooth fracture would not be readily detectable. 4. Sinusitis. Electronically Signed   By: Marnee Spring M.D.   On: 06/09/2016 13:54   Ct Abdomen Pelvis W Contrast  Result Date: 06/09/2016 CLINICAL DATA:  Larey Seat down stairs. EXAM: CT CHEST, ABDOMEN, AND PELVIS WITH CONTRAST TECHNIQUE: Multidetector CT imaging of the chest, abdomen and pelvis was  performed following the standard protocol during bolus administration of intravenous contrast. CONTRAST:  ISOVUE-300 IOPAMIDOL (ISOVUE-300) INJECTION 61% COMPARISON:  None. FINDINGS: CT CHEST FINDINGS Cardiovascular: Normal heart size. No pericardial effusion. The thoracic aorta appears intact. Mediastinum/Nodes: The trachea appears patent and is midline. Normal appearance of the esophagus. Prominent mediastinal lymph nodes identified. Left paratracheal node measures 8 mm. 1.3 cm right hilar lymph node is identified. The 1 cm sub- carinal lymph node identified. Lungs/Pleura: No pleural effusion. Moderate to advanced changes of centrilobular and paraseptal emphysema. No pneumothorax or pulmonary contusion. 6 mm anteromedial right upper lobe lung nodule is identified, image 40 of series 203. Dependent changes  and subsegmental atelectasis noted in the lung bases. Musculoskeletal: There are acute nondisplaced right sixth through tenth rib fractures. No acute left rib fractures. The sternum appears intact. There is a fracture deformity involving the T4 vertebra nondisplaced scratch set mild nondisplaced fracture involving the inferior endplate of T4 is suspected. CT ABDOMEN PELVIS FINDINGS Hepatobiliary: Hemangioma within the posterior right lobe of liver measures 3.6 cm. Within segment 6 there is a larger hemangioma measuring 3.6 cm. The liver is otherwise intact without evidence for rupture. Stones identified within the neck of gallbladder. No biliary dilatation. Pancreas: Unremarkable. No pancreatic ductal dilatation or surrounding inflammatory changes. Spleen: Normal in size without focal abnormality. Adrenals/Urinary Tract: Adreniform enlargement of bilateral adrenal glands. No hemorrhage. The kidneys appear intact. Right kidney cyst noted. Several cystic structures are identified within the left side of pelvis. Favor large bladder diverticula. Urinary bladder otherwise appears normal. Stomach/Bowel: The stomach is normal. The small bowel loops have a normal course and caliber. The appendix is visualized and appears normal. No pathologic dilatation of the colon. Vascular/Lymphatic: Aortic atherosclerosis. No upper abdominal adenopathy. No pelvic or inguinal adenopathy. Reproductive:   Prostate gland is enlarged. Other: No free fluid or fluid collections. Musculoskeletal: Sclerotic lesion in the right iliac wing is favored to represent a benign bone island. 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 IMPRESSION: 1. Multiple acute right rib fracture deformities. There is also an age indeterminate T4 fracture deformity. 2. No evidence for solid organ injury within the abdomen or pelvis. 3. Diffuse bronchial wall thickening with emphysema, as above; imaging findings  suggestive of underlying COPD. 4. Pulmonary nodule in the right lung measures 6 mm. Non-contrast chest CT at 6-12 months is recommended. If the nodule is stable at time of repeat CT, then future CT at 18-24 months (from today's scan) is considered optional for low-risk patients, but is recommended for high-risk patients. This recommendation follows the consensus statement: Guidelines for Management of Incidental Pulmonary Nodules Detected on CT Images:From the Fleischner Society 2017; published online before print (10.1148/[id number]). 5. Liver hemangiomas. 6. Gallstones 7. Prostate gland enlargement and bladder diverticula. Electronically Signed   By: Signa Kell M.D.   On: 06/09/2016 13:53   Dg Chest Port 1 View  Result Date: 06/10/2016 CLINICAL DATA:  Multiple right rib fractures. EXAM: PORTABLE CHEST 1 VIEW COMPARISON:  06/09/2016 FINDINGS: No pneumothorax. No effusion. The lungs are clear except for mild chronic appearing interstitial coarsening and mild linear scarring or atelectasis in the left base. Mediastinal contours are normal. No displaced fractures. IMPRESSION: Negative for pneumothorax or effusion. Mediastinal contours are normal. Moderate interstitial coarsening may be chronic. Electronically Signed   By: Ellery Plunk M.D.   On: 06/10/2016 06:13   Ct Maxillofacial Wo Contrast  Result Date: 06/09/2016 CLINICAL DATA:  Fall down stairs. Forehead abrasion, bloody nose, missing teeth. Initial encounter. EXAM: CT HEAD WITHOUT CONTRAST CT MAXILLOFACIAL WITHOUT CONTRAST CT CERVICAL SPINE WITHOUT CONTRAST TECHNIQUE: Multidetector CT imaging of the head, cervical spine, and maxillofacial structures were performed using the standard protocol without intravenous contrast. Multiplanar CT image reconstructions of the cervical spine and maxillofacial structures were also generated. COMPARISON:  None. FINDINGS: CT HEAD FINDINGS Brain: No evidence of acute infarction, hemorrhage, hydrocephalus,  extra-axial collection or mass lesion/mass effect. Vascular: No hyperdense vessel or unexpected calcification. Skull: Normal. Negative for fracture or focal lesion. CT MAXILLOFACIAL FINDINGS Osseous: Remote left superior orbital rim repair. No acute fracture. Very poor dentition with multiple devitalized teeth and periapical erosions. There is extensive loss of the alveolar ridge involving the mandible. Orbits: No posttraumatic finding. Sinuses: Fluid levels in the right frontal sinus. Both maxillary sinuses are atelectatic and partially opacified. Soft tissues: No opaque foreign body or hematoma. CT CERVICAL SPINE FINDINGS Alignment: No traumatic malalignment. Skull base and vertebrae: Negative for acute fracture. Soft tissues and spinal canal: No prevertebral fluid or swelling. No visible canal hematoma. Disc levels: C5-6 and C6-7 uncovertebral spurring and foraminal narrowing. Upper chest: Biapical emphysema. IMPRESSION: 1. No evidence of intracranial or cervical spine injury. 2. Negative for facial fracture. 3. 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 A superimposed tooth fracture would not be readily detectable. 4. Sinusitis. Electronically Signed   By: Marnee Spring M.D.   On: 06/09/2016 13:54    Cardiac Studies   Pending  Patient Profile     66 y.o. male smoker who was admitted with syncope and fall down multiple steps with multiple injuries.  Troponin levels neg x 3.  Head CT neg for bleed. Patient noted to have paroxysmal atrial fibrillation since admission.  CHADS2-VASc=1 (age).  Tele has also demonstrated NSVT.   Echo is pending.  Nuclear stress test is planned this AM.  ECG last night demonstrated recurrent AF with RVR with HR 140s.  He converted back to NSR on his own.  Assessment & Plan    1.  Syncope - Unexplained.  PAF and sinus pauses noted on tele previously.  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  Tele/ECG last night with AF with  RVR but no pauses documented.  He is back in NSR this AM.  -  Pharmacologic stress done this AM >> result pending.  -  Echo pending  -  No driving x 6 mos  -  May need EP consult/OP event monitor  2. Paroxysmal atrial fibrillation - He has had episodes of AF with RVR here.  CHADS2-VASc=1 (age) as of right now.  Echo pending. With how fast his HR goes, we may need to consider anti-arrhythmic drug therapy.  May need EP to see.    3. NSVT - Echo and stress test pending.    4. Pulmonary Nodule - Noted on CT.  Per primary service.  Signed, Tereso Newcomer, PA-C  06/11/2016, 11:21 AM     The patient was seen, examined and discussed with Tereso Newcomer, PA-C and I agree with the above.   I have personally reviewed his echocardiogram that is completely normal and the stress test that shows no ischemia. He is currently asymptomatic and in SR. No nsVTs on telemetry. I will start diltiazem 120 mg CD daily.  Tobias Alexander, MD 06/11/2016

## 2016-06-11 NOTE — Progress Notes (Signed)
Consult/PROGRESS NOTE    Patrick Kelly  ZOX:[id number] DOB: 08-Feb-1951 DOA: 06/09/2016 PCP: No PCP Per Patient    Brief Narrative:  Patient is a 66 year old gentleman who presented with a unfortunate syncopal episode resulting in significant traumatic injury to the patient as he fell down 14 concrete steps. Patient admitted to the trauma team. Hospitalist consulted for management of general medical issues as well as evaluation of syncopal episode. Patient noted to be going in and out of atrial fibrillation as well as significant bradycardia and sinus pauses. Cardiology consulted. Patient for Myoview stress test 06/11/2016.  Assessment & Plan:   Principal Problem:   Syncope Active Problems:   Multiple fractures of ribs, right side, initial encounter for closed fracture   Sinusitis   Pulmonary nodule   Tobacco use   Abnormal CT of the chest   Dental caries   PAF (paroxysmal atrial fibrillation) (HCC)   Peripheral vascular obstructive disease (HCC)  #1 syncope Likely cardiogenic in nature. 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 Patient noted to going to atrial fibrillation the evening of 06/09/2016, was given Lopressor 10 mg IV 1 with coverage and back into normal sinus rhythm. Patient also noted to have several episodes of sinus pauses ranging from 2 to approximately 4 seconds. Patient also noted to have bradycardia with heart rates in the 30s. The night of 06/10/2016 patient went transiently into atrial fibrillation and then back into normal sinus rhythm before any IV medications could be given. Head CT negative. 2-D echo pending. TSH within normal limits of 0.506. Fasting lipid panel with a total cholesterol 130, LDL of 90, HDL of 32. Cardiac enzymes negative 3. Hemoglobin A1c pending. Patient with no focal neurological symptoms. Hold any AV nodal blocking agents. Patient has been seen in consultation by cardiology and recommended Myoview stress study today for further evaluation.  #2  paroxysmal new onset atrial fibrillation CHA2DS2VASC =1 Cardiac enzymes negative 3. TSH within normal limits. No electrolyte abnormalities. 2-D echo pending. Cardiology following.    #3 sinusitis  Noted on CT maxillofacial. Patient also noted on admission to have a leukocytosis. Continue Augmentin and treat for 7-10 days.  #4 abnormal chest CT/pulmonary nodule right upper lung per CT chest Patient with an extensive history of tobacco abuse and ongoing tobacco use. Patient does not have a PCP. CT chest with findings concerning for emphysema. Patient will need repeat CT chest to follow-up on pulmonary nodule of the right lung in about 6-12 months. Place patient on Spiriva and Dulera. DuoNeb nebs as needed. Case manager to assist with finding PCP.  #5 tobacco abuse Tobacco cessation. Patient not interested in nicotine patch at this time.  #6 dental caries Patient with known dental care. Outpatient follow-up with dental  #7 multiple rib fractures on the right side Patient with complaints of right rib pain with minimal movement which he feels has worsened today. Continue incentive spirometry. Place on scheduled ibuprofen 600 mg 3 times daily. ?? Repeat rib films, will defer to primary team. Per trauma team.  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 Patient with ongoing tobacco abuse. Tobacco cessation stressed to patient. Patient is currently stable is speaking in full sentences. Continue Spiriva and Dulera. Nebs as needed.    DVT prophylaxis: Lovenox Code Status: Full Family Communication: Updated patient. No family at bedside. Disposition Plan: Per primary team.   Consultants:   [facility]: Dr Konrad Dolores 06/09/2016  Cardiology: Dr. Tenny Craw 06/10/2016  Procedures:   CT head 06/09/2016  CT maxillofacial and C-spine 06/09/2016.  CT chest 06/09/2016  CT abdomen and pelvis 06/09/2016  Chest x-ray 06/10/2016  New care medicine stress test pending  Antimicrobials:  Augmentin  06/09/2016   Subjective: Patient denies any chest pain. No shortness of breath. Patient with complaints of right-sided rib pain with minimal movement which he feels may be worse this morning. Patient noted to go into A. fib overnight however subsequently converted back into normal sinus rhythm before any IV medications could be given per nursing.  Objective: Vitals:   06/11/16 0331 06/11/16 0507 06/11/16 0738 06/11/16 0804  BP: 131/90   (!) 141/96  Pulse: 83   83  Resp: 19   20  Temp: 98.5 F (36.9 C)   98.2 F (36.8 C)  TempSrc: Oral   Oral  SpO2: 90%  (!) 89% 91%  Weight:  81 kg (178 lb 9.2 oz)    Height:        Intake/Output Summary (Last 24 hours) at 06/11/16 0932 Last data filed at 06/11/16 0600  Gross per 24 hour  Intake              790 ml  Output             1800 ml  Net            -1010 ml   Filed Weights   06/10/16 0316 06/11/16 0507  Weight: 79.6 kg (175 lb 7.8 oz) 81 kg (178 lb 9.2 oz)    Examination:  General exam: Appears calm and comfortable.Abrasion noted on nose. Respiratory system: Clear to auscultation anterior lung fields. Respiratory effort normal. Cardiovascular system: S1 & S2 heard, RRR. No JVD, murmurs, rubs, gallops or clicks. No pedal edema. Gastrointestinal system: Abdomen is nondistended, soft and nontender. No organomegaly or masses felt. Normal bowel sounds heard. Central nervous system: Alert and oriented. No focal neurological deficits. Extremities: Symmetric 5 x 5 power. Skin: No rashes, lesions or ulcers Psychiatry: Judgement and insight appear normal. Mood & affect appropriate.     Data Reviewed: I have personally reviewed following labs and imaging studies  CBC:  Recent Labs Lab 06/09/16 1156 06/10/16 0232 06/11/16 0210  WBC 12.6* 9.0 8.2  NEUTROABS  --   --  6.4  HGB 14.7 14.6 13.4  HCT 42.9 43.5 40.8  MCV 97.1 98.2 98.6  PLT 172 187 148*   Basic Metabolic Panel:  Recent Labs Lab 06/09/16 1156 06/10/16 0232  06/11/16 0210  NA 134* 135 135  K 4.0 4.2 4.3  CL 102 99* 98*  CO2 GLUCOSE 91 112* 111*  BUN CREATININE 0.97 0.90 0.95  CALCIUM 8.8* 8.8* 8.5*   GFR: Estimated Creatinine Clearance: 77.5 mL/min (by C-G formula based on SCr of 0.95 mg/dL). Liver Function Tests:  Recent Labs Lab 06/09/16 1156  AST 19  ALT 7*  ALKPHOS 89  BILITOT 0.9  PROT 6.3*  ALBUMIN 3.4*   No results for input(s): LIPASE, AMYLASE in the last 168 hours. No results for input(s): AMMONIA in the last 168 hours. Coagulation Profile: No results for input(s): INR, PROTIME in the last 168 hours. Cardiac Enzymes:  Recent Labs Lab 06/09/16 2001 06/10/16 0232 06/10/16 0650 06/10/16 1217 06/10/16 1850  CKTOTAL  --  398*  --   --   --   TROPONINI <0.03  --  <0.03 <0.03 <0.03   BNP (last 3 results) No results for input(s): PROBNP in the last 8760 hours. HbA1C: No results for input(s): HGBA1C in  the last 72 hours. CBG:  Recent Labs Lab 06/10/16 0513 06/10/16 0810 06/11/16 0628 06/11/16 0802  GLUCAP 107* 98 103* 101*   Lipid Profile:  Recent Labs  06/11/16 0210  CHOL 130  HDL 32*  LDLCALC 90  TRIG 42  CHOLHDL 4.1   Thyroid Function Tests:  Recent Labs  06/09/16 1609  TSH 0.506   Anemia Panel: No results for input(s): VITAMINB12, FOLATE, FERRITIN, TIBC, IRON, RETICCTPCT in the last 72 hours. Sepsis Labs: No results for input(s): PROCALCITON, LATICACIDVEN in the last 168 hours.  Recent Results (from the past 240 hour(s))  MRSA PCR Screening     Status: None   Collection Time: 06/09/16  8:57 PM  Result Value Ref Range Status   MRSA by PCR NEGATIVE NEGATIVE Final    Comment:        The GeneXpert MRSA Assay (FDA approved for NASAL specimens only), is one component of a comprehensive MRSA colonization surveillance program. It is not intended to diagnose MRSA infection nor to guide or monitor treatment for MRSA infections.          Radiology  Studies: Ct Head Wo Contrast  Result Date: 06/09/2016 CLINICAL DATA:  Fall down stairs. Forehead abrasion, bloody nose, missing teeth. Initial encounter. EXAM: CT HEAD WITHOUT CONTRAST CT MAXILLOFACIAL WITHOUT CONTRAST CT CERVICAL SPINE WITHOUT CONTRAST TECHNIQUE: Multidetector CT imaging of the head, cervical spine, and maxillofacial structures were performed using the standard protocol without intravenous contrast. Multiplanar CT image reconstructions of the cervical spine and maxillofacial structures were also generated. COMPARISON:  None. FINDINGS: CT HEAD FINDINGS Brain: No evidence of acute infarction, hemorrhage, hydrocephalus, extra-axial collection or mass lesion/mass effect. Vascular: No hyperdense vessel or unexpected calcification. Skull: Normal. Negative for fracture or focal lesion. CT MAXILLOFACIAL FINDINGS Osseous: Remote left superior orbital rim repair. No acute fracture. Very poor dentition with multiple devitalized teeth and periapical erosions. There is extensive loss of the alveolar ridge involving the mandible. Orbits: No posttraumatic finding. Sinuses: Fluid levels in the right frontal sinus. Both maxillary sinuses are atelectatic and partially opacified. Soft tissues: No opaque foreign body or hematoma. CT CERVICAL SPINE FINDINGS Alignment: No traumatic malalignment. Skull base and vertebrae: Negative for acute fracture. Soft tissues and spinal canal: No prevertebral fluid or swelling. No visible canal hematoma. Disc levels: C5-6 and C6-7 uncovertebral spurring and foraminal narrowing. Upper chest: Biapical emphysema. IMPRESSION: 1. No evidence of intracranial or cervical spine injury. 2. Negative for facial fracture. 3. 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 A superimposed tooth fracture would not be readily detectable. 4. Sinusitis. Electronically Signed   By: Marnee Spring M.D.   On: 06/09/2016 13:54   Ct Chest W Contrast  Result Date: 06/09/2016 CLINICAL  DATA:  Larey Seat down stairs. EXAM: CT CHEST, ABDOMEN, AND PELVIS WITH CONTRAST TECHNIQUE: Multidetector CT imaging of the chest, abdomen and pelvis was performed following the standard protocol during bolus administration of intravenous contrast. CONTRAST:  ISOVUE-300 IOPAMIDOL (ISOVUE-300) INJECTION 61% COMPARISON:  None. FINDINGS: CT CHEST FINDINGS Cardiovascular: Normal heart size. No pericardial effusion. The thoracic aorta appears intact. Mediastinum/Nodes: The trachea appears patent and is midline. Normal appearance of the esophagus. Prominent mediastinal lymph nodes identified. Left paratracheal node measures 8 mm. 1.3 cm right hilar lymph node is identified. The 1 cm sub- carinal lymph node identified. Lungs/Pleura: No pleural effusion. Moderate to advanced changes of centrilobular and paraseptal emphysema. No pneumothorax or pulmonary contusion. 6 mm anteromedial right upper lobe lung nodule is identified, image 40  of series 203. Dependent changes and subsegmental atelectasis noted in the lung bases. Musculoskeletal: There are acute nondisplaced right sixth through tenth rib fractures. No acute left rib fractures. The sternum appears intact. There is a fracture deformity involving the T4 vertebra nondisplaced scratch set mild nondisplaced fracture involving the inferior endplate of T4 is suspected. CT ABDOMEN PELVIS FINDINGS Hepatobiliary: Hemangioma within the posterior right lobe of liver measures 3.6 cm. Within segment 6 there is a larger hemangioma measuring 3.6 cm. The liver is otherwise intact without evidence for rupture. Stones identified within the neck of gallbladder. No biliary dilatation. Pancreas: Unremarkable. No pancreatic ductal dilatation or surrounding inflammatory changes. Spleen: Normal in size without focal abnormality. Adrenals/Urinary Tract: Adreniform enlargement of bilateral adrenal glands. No hemorrhage. The kidneys appear intact. Right kidney cyst noted. Several cystic  structures are identified within the left side of pelvis. Favor large bladder diverticula. Urinary bladder otherwise appears normal. Stomach/Bowel: The stomach is normal. The small bowel loops have a normal course and caliber. The appendix is visualized and appears normal. No pathologic dilatation of the colon. Vascular/Lymphatic: Aortic atherosclerosis. No upper abdominal adenopathy. No pelvic or inguinal adenopathy. Reproductive:   Prostate gland is enlarged. Other: No free fluid or fluid collections. Musculoskeletal: Sclerotic lesion in the right iliac wing is favored to represent a benign bone island. 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 IMPRESSION: 1. Multiple acute right rib fracture deformities. There is also an age indeterminate T4 fracture deformity. 2. No evidence for solid organ injury within the abdomen or pelvis. 3. Diffuse bronchial wall thickening with emphysema, as above; imaging findings suggestive of underlying COPD. 4. Pulmonary nodule in the right lung measures 6 mm. Non-contrast chest CT at 6-12 months is recommended. If the nodule is stable at time of repeat CT, then future CT at 18-24 months (from today's scan) is considered optional for low-risk patients, but is recommended for high-risk patients. This recommendation follows the consensus statement: Guidelines for Management of Incidental Pulmonary Nodules Detected on CT Images:From the Fleischner Society 2017; published online before print (10.1148/radiol.[id number]). 5. Liver hemangiomas. 6. Gallstones 7. Prostate gland enlargement and bladder diverticula. Electronically Signed   By: Signa Kell M.D.   On: 06/09/2016 13:53   Ct Cervical Spine Wo Contrast  Result Date: 06/09/2016 CLINICAL DATA:  Fall down stairs. Forehead abrasion, bloody nose, missing teeth. Initial encounter. EXAM: CT HEAD WITHOUT CONTRAST CT MAXILLOFACIAL WITHOUT CONTRAST CT CERVICAL SPINE WITHOUT  CONTRAST TECHNIQUE: Multidetector CT imaging of the head, cervical spine, and maxillofacial structures were performed using the standard protocol without intravenous contrast. Multiplanar CT image reconstructions of the cervical spine and maxillofacial structures were also generated. COMPARISON:  None. FINDINGS: CT HEAD FINDINGS Brain: No evidence of acute infarction, hemorrhage, hydrocephalus, extra-axial collection or mass lesion/mass effect. Vascular: No hyperdense vessel or unexpected calcification. Skull: Normal. Negative for fracture or focal lesion. CT MAXILLOFACIAL FINDINGS Osseous: Remote left superior orbital rim repair. No acute fracture. Very poor dentition with multiple devitalized teeth and periapical erosions. There is extensive loss of the alveolar ridge involving the mandible. Orbits: No posttraumatic finding. Sinuses: Fluid levels in the right frontal sinus. Both maxillary sinuses are atelectatic and partially opacified. Soft tissues: No opaque foreign body or hematoma. CT CERVICAL SPINE FINDINGS Alignment: No traumatic malalignment. Skull base and vertebrae: Negative for acute fracture. Soft tissues and spinal canal: No prevertebral fluid or swelling. No visible canal hematoma. Disc levels: C5-6 and C6-7 uncovertebral spurring and  foraminal narrowing. Upper chest: Biapical emphysema. IMPRESSION: 1. No evidence of intracranial or cervical spine injury. 2. Negative for facial fracture. 3. 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 A superimposed tooth fracture would not be readily detectable. 4. Sinusitis. Electronically Signed   By: Marnee Spring M.D.   On: 06/09/2016 13:54   Ct Abdomen Pelvis W Contrast  Result Date: 06/09/2016 CLINICAL DATA:  Larey Seat down stairs. EXAM: CT CHEST, ABDOMEN, AND PELVIS WITH CONTRAST TECHNIQUE: Multidetector CT imaging of the chest, abdomen and pelvis was performed following the standard protocol during bolus administration of intravenous  contrast. CONTRAST:  ISOVUE-300 IOPAMIDOL (ISOVUE-300) INJECTION 61% COMPARISON:  None. FINDINGS: CT CHEST FINDINGS Cardiovascular: Normal heart size. No pericardial effusion. The thoracic aorta appears intact. Mediastinum/Nodes: The trachea appears patent and is midline. Normal appearance of the esophagus. Prominent mediastinal lymph nodes identified. Left paratracheal node measures 8 mm. 1.3 cm right hilar lymph node is identified. The 1 cm sub- carinal lymph node identified. Lungs/Pleura: No pleural effusion. Moderate to advanced changes of centrilobular and paraseptal emphysema. No pneumothorax or pulmonary contusion. 6 mm anteromedial right upper lobe lung nodule is identified, image 40 of series 203. Dependent changes and subsegmental atelectasis noted in the lung bases. Musculoskeletal: There are acute nondisplaced right sixth through tenth rib fractures. No acute left rib fractures. The sternum appears intact. There is a fracture deformity involving the T4 vertebra nondisplaced scratch set mild nondisplaced fracture involving the inferior endplate of T4 is suspected. CT ABDOMEN PELVIS FINDINGS Hepatobiliary: Hemangioma within the posterior right lobe of liver measures 3.6 cm. Within segment 6 there is a larger hemangioma measuring 3.6 cm. The liver is otherwise intact without evidence for rupture. Stones identified within the neck of gallbladder. No biliary dilatation. Pancreas: Unremarkable. No pancreatic ductal dilatation or surrounding inflammatory changes. Spleen: Normal in size without focal abnormality. Adrenals/Urinary Tract: Adreniform enlargement of bilateral adrenal glands. No hemorrhage. The kidneys appear intact. Right kidney cyst noted. Several cystic structures are identified within the left side of pelvis. Favor large bladder diverticula. Urinary bladder otherwise appears normal. Stomach/Bowel: The stomach is normal. The small bowel loops have a normal course and caliber. The appendix is  visualized and appears normal. No pathologic dilatation of the colon. Vascular/Lymphatic: Aortic atherosclerosis. No upper abdominal adenopathy. No pelvic or inguinal adenopathy. Reproductive:   Prostate gland is enlarged. Other: No free fluid or fluid collections. Musculoskeletal: Sclerotic lesion in the right iliac wing is favored to represent a benign bone island. 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 IMPRESSION: 1. Multiple acute right rib fracture deformities. There is also an age indeterminate T4 fracture deformity. 2. No evidence for solid organ injury within the abdomen or pelvis. 3. Diffuse bronchial wall thickening with emphysema, as above; imaging findings suggestive of underlying COPD. 4. Pulmonary nodule in the right lung measures 6 mm. Non-contrast chest CT at 6-12 months is recommended. If the nodule is stable at time of repeat CT, then future CT at 18-24 months (from today's scan) is considered optional for low-risk patients, but is recommended for high-risk patients. This recommendation follows the consensus statement: Guidelines for Management of Incidental Pulmonary Nodules Detected on CT Images:From the Fleischner Society 2017; published online before print (10.1148/radiol.[id number]). 5. Liver hemangiomas. 6. Gallstones 7. Prostate gland enlargement and bladder diverticula. Electronically Signed   By: Signa Kell M.D.   On: 06/09/2016 13:53   Dg Chest Port 1 View  Result Date: 06/10/2016  CLINICAL DATA:  Multiple right rib fractures. EXAM: PORTABLE CHEST 1 VIEW COMPARISON:  06/09/2016 FINDINGS: No pneumothorax. No effusion. The lungs are clear except for mild chronic appearing interstitial coarsening and mild linear scarring or atelectasis in the left base. Mediastinal contours are normal. No displaced fractures. IMPRESSION: Negative for pneumothorax or effusion. Mediastinal contours are normal. Moderate interstitial coarsening  may be chronic. Electronically Signed   By: Ellery Plunk M.D.   On: 06/10/2016 06:13   Ct Maxillofacial Wo Contrast  Result Date: 06/09/2016 CLINICAL DATA:  Fall down stairs. Forehead abrasion, bloody nose, missing teeth. Initial encounter. EXAM: CT HEAD WITHOUT CONTRAST CT MAXILLOFACIAL WITHOUT CONTRAST CT CERVICAL SPINE WITHOUT CONTRAST TECHNIQUE: Multidetector CT imaging of the head, cervical spine, and maxillofacial structures were performed using the standard protocol without intravenous contrast. Multiplanar CT image reconstructions of the cervical spine and maxillofacial structures were also generated. COMPARISON:  None. FINDINGS: CT HEAD FINDINGS Brain: No evidence of acute infarction, hemorrhage, hydrocephalus, extra-axial collection or mass lesion/mass effect. Vascular: No hyperdense vessel or unexpected calcification. Skull: Normal. Negative for fracture or focal lesion. CT MAXILLOFACIAL FINDINGS Osseous: Remote left superior orbital rim repair. No acute fracture. Very poor dentition with multiple devitalized teeth and periapical erosions. There is extensive loss of the alveolar ridge involving the mandible. Orbits: No posttraumatic finding. Sinuses: Fluid levels in the right frontal sinus. Both maxillary sinuses are atelectatic and partially opacified. Soft tissues: No opaque foreign body or hematoma. CT CERVICAL SPINE FINDINGS Alignment: No traumatic malalignment. Skull base and vertebrae: Negative for acute fracture. Soft tissues and spinal canal: No prevertebral fluid or swelling. No visible canal hematoma. Disc levels: C5-6 and C6-7 uncovertebral spurring and foraminal narrowing. Upper chest: Biapical emphysema. IMPRESSION: 1. No evidence of intracranial or cervical spine injury. 2. Negative for facial fracture. 3. 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 A superimposed tooth fracture would not be readily detectable. 4. Sinusitis. Electronically Signed   By: Marnee Spring M.D.   On: 06/09/2016 13:54        Scheduled Meds: . amoxicillin-clavulanate  1 tablet Oral Q12H  . diltiazem  10 mg Intravenous Once  . enoxaparin (LOVENOX) injection  40 mg Subcutaneous Q24H  . mometasone-formoterol  2 puff Inhalation BID  . pantoprazole  40 mg Oral Daily   Or  . pantoprazole (PROTONIX) IV  40 mg Intravenous Daily  . sodium chloride flush  3 mL Intravenous Q12H  . tiotropium  18 mcg Inhalation Daily   Continuous Infusions: . dextrose 5 % and 0.45 % NaCl with KCl 20 mEq/L 50 mL/hr at 06/10/16 2330  . diltiazem (CARDIZEM) infusion       LOS: 2 days    Time spent: 40 minutes    THOMPSON,Alessander, MD [facility] Pager [phone]  If 7PM-7AM, please contact night-coverage [email or web address] Password [email or web address] 06/11/2016, 9:32 AM

## 2016-06-11 NOTE — Progress Notes (Signed)
  Echocardiogram 2D Echocardiogram has been performed.  Patrick Kelly N Patrick Kelly 06/11/2016, 1:25 PM 

## 2016-06-12 ENCOUNTER — Inpatient Hospital Stay (HOSPITAL_COMMUNITY): Payer: Medicaid Other

## 2016-06-12 DIAGNOSIS — R911 Solitary pulmonary nodule: Secondary | ICD-10-CM

## 2016-06-12 LAB — BASIC METABOLIC PANEL
ANION GAP: 7 (ref 5–15)
BUN: 11 mg/dL (ref 6–20)
CALCIUM: 8 mg/dL — AB (ref 8.9–10.3)
CO2: 26 mmol/L (ref 22–32)
Chloride: 102 mmol/L (ref 101–111)
Creatinine, Ser: 0.9 mg/dL (ref 0.61–1.24)
Glucose, Bld: 92 mg/dL (ref 65–99)
Potassium: 3.9 mmol/L (ref 3.5–5.1)
SODIUM: 135 mmol/L (ref 135–145)

## 2016-06-12 LAB — CBC WITH DIFFERENTIAL/PLATELET
Basophils Absolute: 0 10*3/uL (ref 0.0–0.1)
Basophils Relative: 0 %
Eosinophils Absolute: 0 10*3/uL (ref 0.0–0.7)
Eosinophils Relative: 1 %
HEMATOCRIT: 37 % — AB (ref 39.0–52.0)
Hemoglobin: 12.2 g/dL — ABNORMAL LOW (ref 13.0–17.0)
Lymphocytes Relative: 18 %
Lymphs Abs: 1.1 10*3/uL (ref 0.7–4.0)
MCH: 32.3 pg (ref 26.0–34.0)
MCHC: 33 g/dL (ref 30.0–36.0)
MCV: 97.9 fL (ref 78.0–100.0)
MONO ABS: 0.7 10*3/uL (ref 0.1–1.0)
Monocytes Relative: 12 %
NEUTROS ABS: 4 10*3/uL (ref 1.7–7.7)
Neutrophils Relative %: 69 %
PLATELETS: 136 10*3/uL — AB (ref 150–400)
RBC: 3.78 MIL/uL — ABNORMAL LOW (ref 4.22–5.81)
RDW: 13.1 % (ref 11.5–15.5)
WBC: 5.9 10*3/uL (ref 4.0–10.5)

## 2016-06-12 LAB — HEMOGLOBIN A1C
HEMOGLOBIN A1C: 5 % (ref 4.8–5.6)
MEAN PLASMA GLUCOSE: 97 mg/dL

## 2016-06-12 LAB — GLUCOSE, CAPILLARY
GLUCOSE-CAPILLARY: 101 mg/dL — AB (ref 65–99)
GLUCOSE-CAPILLARY: 101 mg/dL — AB (ref 65–99)

## 2016-06-12 MED ORDER — NICOTINE 21 MG/24HR TD PT24
21.0000 mg | MEDICATED_PATCH | Freq: Every day | TRANSDERMAL | Status: DC
  Administered 2016-06-12 – 2016-06-15 (×4): 21 mg via TRANSDERMAL
  Filled 2016-06-12 (×4): qty 1

## 2016-06-12 MED ORDER — LORAZEPAM 0.5 MG PO TABS
0.5000 mg | ORAL_TABLET | ORAL | Status: DC | PRN
Start: 2016-06-12 — End: 2016-06-15
  Administered 2016-06-12: 0.5 mg via ORAL
  Filled 2016-06-12: qty 1

## 2016-06-12 MED ORDER — BUDESONIDE-FORMOTEROL FUMARATE 160-4.5 MCG/ACT IN AERO: 2.0000 | INHALATION_SPRAY | Freq: Two times a day (BID) | RESPIRATORY_TRACT | 2 refills | Status: DC

## 2016-06-12 MED ORDER — PANTOPRAZOLE SODIUM 40 MG PO TBEC: 40.0000 mg | DELAYED_RELEASE_TABLET | Freq: Every day | ORAL | 1 refills | Status: DC

## 2016-06-12 MED ORDER — TIOTROPIUM BROMIDE MONOHYDRATE 18 MCG IN CAPS: 18.0000 ug | ORAL_CAPSULE | Freq: Every day | RESPIRATORY_TRACT | 2 refills | Status: DC

## 2016-06-12 NOTE — Progress Notes (Signed)
Progress Note  Patient Name: Patrick Kelly Date of Encounter: 06/12/2016  Primary Cardiologist: Dr. Dietrich Pates   Subjective   The patient feels better today, improving rib pain, he was able to walk today.  Inpatient Medications    Scheduled Meds: . amoxicillin-clavulanate  1 tablet Oral Q12H  . diltiazem  120 mg Oral Daily  . enoxaparin (LOVENOX) injection  40 mg Subcutaneous Q24H  . ibuprofen  600 mg Oral TID  . methocarbamol  750 mg Oral TID  . mometasone-formoterol  2 puff Inhalation BID  . pantoprazole  40 mg Oral Daily   Or  . pantoprazole (PROTONIX) IV  40 mg Intravenous Daily  . senna-docusate  1 tablet Oral BID  . sodium chloride flush  3 mL Intravenous Q12H  . tiotropium  18 mcg Inhalation Daily   Continuous Infusions: . dextrose 5 % and 0.45 % NaCl with KCl 20 mEq/L 50 mL/hr at 06/11/16 2000   PRN Meds: acetaminophen, alum & mag hydroxide-simeth, hydrALAZINE, HYDROmorphone (DILAUDID) injection, levalbuterol, nitroGLYCERIN, ondansetron **OR** ondansetron (ZOFRAN) IV, oxyCODONE   Vital Signs    Vitals:   06/12/16 0003 06/12/16 0415 06/12/16 0737 06/12/16 0819  BP: 118/67 138/84 (!) 127/92   Pulse: 73 70 71 71  Resp: Temp: 98.1 F (36.7 C) 98.1 F (36.7 C) 98.1 F (36.7 C)   TempSrc: Oral Oral Oral   SpO2: 97% 97% 92% 91%  Weight:  174 lb 2.6 oz (79 kg)    Height:        Intake/Output Summary (Last 24 hours) at 06/12/16 1030 Last data filed at 06/12/16 0900  Gross per 24 hour  Intake             1870 ml  Output             1300 ml  Net              570 ml   Filed Weights   06/10/16 0316 06/11/16 0507 06/12/16 0415  Weight: 175 lb 7.8 oz (79.6 kg) 178 lb 9.2 oz (81 kg) 174 lb 2.6 oz (79 kg)    Telemetry    AFib with RVR HR 140s >> NSR (no pauses noted on available strips in EMR) - Personally Reviewed  ECG    ECG in Nuc Med reviewed: NSR, HR 78, no acute ST changes - Personally Reviewed  Physical Exam   GEN: No acute  distress.   Neck: No JVD Cardiac: RRR, no murmurs   Respiratory: Clear to auscultation bilaterally. GI: Soft, nontender MS: No edema  Neuro:  Nonfocal  Psych: Normal affect   Labs    Chemistry  Recent Labs Lab 06/09/16 1156 06/10/16 0232 06/11/16 0210 06/12/16 0218  NA 134* 135 135 135  K 4.0 4.2 4.3 3.9  CL 102 99* 98* 102  CO2 GLUCOSE 91 112* 111* 92  BUN CREATININE 0.97 0.90 0.95 0.90  CALCIUM 8.8* 8.8* 8.5* 8.0*  PROT 6.3*  --   --   --   ALBUMIN 3.4*  --   --   --   AST 19  --   --   --   ALT 7*  --   --   --   ALKPHOS 89  --   --   --   BILITOT 0.9  --   --   --   GFRNONAA >60 >60 >60 >60  GFRAA >  60 >60 >60 >60  ANIONGAP Hematology  Recent Labs Lab 06/10/16 0232 06/11/16 0210 06/12/16 0218  WBC 9.0 8.2 5.9  RBC 4.43 4.14* 3.78*  HGB 14.6 13.4 12.2*  HCT 43.5 40.8 37.0*  MCV 98.2 98.6 97.9  MCH 33.0 32.4 32.3  MCHC 33.6 32.8 33.0  RDW 13.4 13.3 13.1  PLT 187 148* 136*    Cardiac Enzymes  Recent Labs Lab 06/09/16 2001 06/10/16 0650 06/10/16 1217 06/10/16 1850  TROPONINI <0.03 <0.03 <0.03 <0.03     Recent Labs Lab 06/09/16 1215 06/09/16 1624 06/09/16 1841  TROPIPOC 0.00 0.01 0.00     BNPNo results for input(s): BNP, PROBNP in the last 168 hours.   DDimer No results for input(s): DDIMER in the last 168 hours.   Radiology    Nm Myocar Multi W/spect W/wall Motion / Ef  Result Date: 06/11/2016 CLINICAL DATA:  Syncope.  Smoker. EXAM: MYOCARDIAL IMAGING WITH SPECT (REST AND PHARMACOLOGIC-STRESS) GATED LEFT VENTRICULAR WALL MOTION STUDY LEFT VENTRICULAR EJECTION FRACTION TECHNIQUE: Standard myocardial SPECT imaging was performed after resting intravenous injection of 10 mCi Tc-59m tetrofosmin. Subsequently, intravenous infusion of Lexiscan was performed under the supervision of the Cardiology staff. At peak effect of the drug, 30 mCi Tc-22m tetrofosmin was injected intravenously and standard myocardial  SPECT imaging was performed. Quantitative gated imaging was also performed to evaluate left ventricular wall motion, and estimate left ventricular ejection fraction. COMPARISON:  None. FINDINGS: Perfusion: No decreased activity in the left ventricle on stress imaging to suggest reversible ischemia or infarction. Wall Motion: Normal left ventricular wall motion. No left ventricular dilation. Left Ventricular Ejection Fraction: 76 % End diastolic volume 97 ml End systolic volume 23 ml IMPRESSION: 1. No reversible ischemia or infarction. 2. Normal left ventricular wall motion. 3. Left ventricular ejection fraction 76% 4. Non invasive risk stratification*: Low *2012 Appropriate Use Criteria for Coronary Revascularization Focused Update: J Am Coll Cardiol. 2012;59(9):857-881. http://content.[email or web address]?[email or web address] Electronically Signed   By: Myles Rosenthal M.D.   On: 06/11/2016 15:26   Dg Chest Port 1 View  Result Date: 06/12/2016 CLINICAL DATA:  Pain after fall.  Right flank pain. EXAM: PORTABLE CHEST 1 VIEW COMPARISON:  June 10, 2016 FINDINGS: No pneumothorax. Stable cardiomegaly. The hila and mediastinum are unremarkable. Probable tiny left effusion. Bibasilar opacities remain, mildly worsened in the interval. Coarsened interstitial opacities remain, unchanged. IMPRESSION: 1. Mild increased bibasilar opacities may represent atelectasis or early infiltrate. Recommend clinical correlation and follow-up to resolution. 2. No pneumothorax. 3. Coarsened interstitium, unchanged. Electronically Signed   By: Gerome Sam III M.D   On: 06/12/2016 07:16   Cardiac Studies   Pending  Patient Profile     66 y.o. male smoker who was admitted with syncope and fall down multiple steps with multiple injuries.  Troponin levels neg x 3.  Head CT neg for bleed. Patient noted to have paroxysmal atrial fibrillation since admission.  CHADS2-VASc=1 (age).  Tele has also demonstrated NSVT.   Echo is pending.   Nuclear stress test is planned this AM.  ECG last night demonstrated recurrent AF with RVR with HR 140s.  He converted back to NSR on his own.  Assessment & Plan    1.  Syncope - Unexplained.  PAF and sinus pauses noted on tele previously.  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  Tele/ECG last night with AF with RVR but no pauses documented.  He is back in NSR this AM.  -  Pharmacologic stress test low risk , no evidence for infarct or ischemia.  -  Echo completely normal  -  No driving x 6 mos  -  He will need an EP consult/OP event monitor - we will arrange in the next 1-2 weeks in our clinic  Started on Cardizem 120 CD yesterday and he is tolerating it well.   2. Paroxysmal atrial fibrillation - He has had episodes of AF with RVR here.  CHADS2-VASc=1 (age) as of right now.  Echo pending. With how fast his HR goes, we may need to consider anti-arrhythmic drug therapy.  May need EP to see.    3. NSVT - Echo and stress test pending.    4. Pulmonary Nodule - Noted on CT.  Per primary service.  Tobias Alexander, MD 06/12/2016

## 2016-06-12 NOTE — Progress Notes (Signed)
Consult/PROGRESS NOTE    Patrick Kelly  WUJ:[id number] DOB: Nov 15, 1950 DOA: 06/09/2016 PCP: No PCP Per Patient    Brief Narrative:  Patient is a 66 year old gentleman who presented with a unfortunate syncopal episode resulting in significant traumatic injury to the patient as he fell down 14 concrete steps. Patient admitted to the trauma team. Hospitalist consulted for management of general medical issues as well as evaluation of syncopal episode. Patient noted to be going in and out of atrial fibrillation as well as significant bradycardia and sinus pauses. Cardiology consulted. Patient for Myoview stress test 06/11/2016.  Assessment & Plan:   Principal Problem:   Syncope Active Problems:   Multiple fractures of ribs, right side, initial encounter for closed fracture   Sinusitis   Pulmonary nodule   Tobacco use   Abnormal CT of the chest   Dental caries   PAF (paroxysmal atrial fibrillation) (HCC)   Peripheral vascular obstructive disease (HCC)  #1 syncope Likely cardiogenic in nature. Concern for tachybradycardia syndrome with posttermination pauses versus sick sinus syndrome. Patient noted to going to atrial fibrillation the evening of 06/09/2016, was given Lopressor 10 mg IV 1 with coverage and back into normal sinus rhythm. Patient also noted to have several episodes of sinus pauses ranging from 2 to approximately 4 seconds. Patient also noted to have bradycardia with heart rates in the 30s. The night of 06/10/2016 patient went transiently into atrial fibrillation and then back into normal sinus rhythm before any IV medications could be given. Head CT negative. 2-D echo pending. TSH within normal limits of 0.506. Fasting lipid panel with a total cholesterol 130, LDL of 90, HDL of 32. Cardiac enzymes negative 3. Hemoglobin A1c pending. Patient with no focal neurological symptoms. Hold any AV nodal blocking agents. Patient has been seen in consultation by cardiology and patient  underwent Myoview stress study 06/11/2016 with no inducible ischemia. 2 d echo nl. Patient started on oral Cardizem per cardiology on 06/11/2016 with no further bouts of A. fib. Per cardiology.   #2 paroxysmal new onset atrial fibrillation CHA2DS2VASC =1 Cardiac enzymes negative 3. TSH within normal limits. No electrolyte abnormalities. 2-D echo with EF of 60-65%, normal study. Nuclear medicine stress test with no inducible ischemia. Patient started on oral cardizem with no further episodes of further afib. Per Cardiology.   #3 sinusitis  Noted on CT maxillofacial. Patient also noted on admission to have a leukocytosis. Continue Augmentin D3/7.   #4 abnormal chest CT/pulmonary nodule right upper lung per CT chest Patient with an extensive history of tobacco abuse and ongoing tobacco use. Patient does not have a PCP. CT chest with findings concerning for emphysema. Patient will need repeat CT chest to follow-up on pulmonary nodule of the right lung in about 6-12 months. Place patient on Spiriva and Dulera. DuoNeb nebs as needed. Case manager to assist with finding PCP.  #5 tobacco abuse Tobacco cessation. Patient not interested in nicotine patch at this time.  #6 dental caries Patient with known dental care. Outpatient follow-up with dental  #7 multiple rib fractures on the right side Patient states improvement with rib pain after being started on scheduled ibuprofen. Continue incentive spirometry. Continue scheduled ibuprofen 600 mg 3 times daily. ?? Repeat rib films, will defer to primary team. Per trauma team.  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 Patient with ongoing tobacco abuse. Tobacco cessation stressed to patient. Patient is currently stable is speaking in full sentences. Continue Spiriva and Dulera. Nebs as needed.  Will sign  off. Nothing further to add. Call with questions.    DVT prophylaxis: Lovenox Code Status: Full Family Communication: Updated patient. No family at  bedside. Disposition Plan: Per primary team.   Consultants:   [facility]: Dr Konrad Dolores 06/09/2016  Cardiology: Dr. Tenny Craw 06/10/2016  Procedures:   CT head 06/09/2016  CT maxillofacial and C-spine 06/09/2016.  CT chest 06/09/2016  CT abdomen and pelvis 06/09/2016  Chest x-ray 06/10/2016  Nuclear medicine stress test 06/11/2016  2-D echo 06/11/2016  Antimicrobials:  Augmentin 06/09/2016 D3/7.   Subjective: Patient denies any chest pain. No shortness of breath. Patient states scheduled ibuprofen is helping with rib pain. Patient started on diltiazem orally yesterday per cardiology with no further bouts of atrial fibrillation. Patient is currently normal sinus rhythm. Patient states he feels very well and planning on going home today.   Objective: Vitals:   06/12/16 0003 06/12/16 0415 06/12/16 0737 06/12/16 0819  BP: 118/67 138/84 (!) 127/92   Pulse: 73 70 71 71  Resp: Temp: 98.1 F (36.7 C) 98.1 F (36.7 C) 98.1 F (36.7 C)   TempSrc: Oral Oral Oral   SpO2: 97% 97% 92% 91%  Weight:  79 kg (174 lb 2.6 oz)    Height:        Intake/Output Summary (Last 24 hours) at 06/12/16 0944 Last data filed at 06/12/16 0900  Gross per 24 hour  Intake             2070 ml  Output             1750 ml  Net              320 ml   Filed Weights   06/10/16 0316 06/11/16 0507 06/12/16 0415  Weight: 79.6 kg (175 lb 7.8 oz) 81 kg (178 lb 9.2 oz) 79 kg (174 lb 2.6 oz)    Examination:  General exam: Appears calm and comfortable.Abrasion noted on nose. Respiratory system: Clear to auscultation anterior lung fields. Respiratory effort normal. Cardiovascular system: S1 & S2 heard, RRR. No JVD, murmurs, rubs, gallops or clicks. No pedal edema. Gastrointestinal system: Abdomen is nondistended, soft and nontender. No organomegaly or masses felt. Normal bowel sounds heard. Central nervous system: Alert and oriented. No focal neurological deficits. Extremities:  Symmetric 5 x 5 power. Skin: No rashes, lesions or ulcers Psychiatry: Judgement and insight appear normal. Mood & affect appropriate.     Data Reviewed: I have personally reviewed following labs and imaging studies  CBC:  Recent Labs Lab 06/09/16 1156 06/10/16 0232 06/11/16 0210 06/12/16 0218  WBC 12.6* 9.0 8.2 5.9  NEUTROABS  --   --  6.4 4.0  HGB 14.7 14.6 13.4 12.2*  HCT 42.9 43.5 40.8 37.0*  MCV 97.1 98.2 98.6 97.9  PLT 172 187 148* 136*   Basic Metabolic Panel:  Recent Labs Lab 06/09/16 1156 06/10/16 0232 06/11/16 0210 06/12/16 0218  NA 134* 135 135 135  K 4.0 4.2 4.3 3.9  CL 102 99* 98* 102  CO2 GLUCOSE 91 112* 111* 92  BUN CREATININE 0.97 0.90 0.95 0.90  CALCIUM 8.8* 8.8* 8.5* 8.0*   GFR: Estimated Creatinine Clearance: 81.8 mL/min (by C-G formula based on SCr of 0.9 mg/dL). Liver Function Tests:  Recent Labs Lab 06/09/16 1156  AST 19  ALT 7*  ALKPHOS 89  BILITOT 0.9  PROT 6.3*  ALBUMIN 3.4*   No results for input(s): LIPASE,  AMYLASE in the last 168 hours. No results for input(s): AMMONIA in the last 168 hours. Coagulation Profile: No results for input(s): INR, PROTIME in the last 168 hours. Cardiac Enzymes:  Recent Labs Lab 06/09/16 2001 06/10/16 0232 06/10/16 0650 06/10/16 1217 06/10/16 1850  CKTOTAL  --  398*  --   --   --   TROPONINI <0.03  --  <0.03 <0.03 <0.03   BNP (last 3 results) No results for input(s): PROBNP in the last 8760 hours. HbA1C: No results for input(s): HGBA1C in the last 72 hours. CBG:  Recent Labs Lab 06/10/16 0810 06/11/16 0628 06/11/16 0802 06/12/16 0417 06/12/16 0758  GLUCAP 98 103* 101* 101* 101*   Lipid Profile:  Recent Labs  06/11/16 0210  CHOL 130  HDL 32*  LDLCALC 90  TRIG 42  CHOLHDL 4.1   Thyroid Function Tests:  Recent Labs  06/09/16 1609  TSH 0.506   Anemia Panel: No results for input(s): VITAMINB12, FOLATE, FERRITIN, TIBC, IRON, RETICCTPCT in  the last 72 hours. Sepsis Labs: No results for input(s): PROCALCITON, LATICACIDVEN in the last 168 hours.  Recent Results (from the past 240 hour(s))  MRSA PCR Screening     Status: None   Collection Time: 06/09/16  8:57 PM  Result Value Ref Range Status   MRSA by PCR NEGATIVE NEGATIVE Final    Comment:        The GeneXpert MRSA Assay (FDA approved for NASAL specimens only), is one component of a comprehensive MRSA colonization surveillance program. It is not intended to diagnose MRSA infection nor to guide or monitor treatment for MRSA infections.          Radiology Studies: Nm Myocar Multi W/spect W/wall Motion / Ef  Result Date: 06/11/2016 CLINICAL DATA:  Syncope.  Smoker. EXAM: MYOCARDIAL IMAGING WITH SPECT (REST AND PHARMACOLOGIC-STRESS) GATED LEFT VENTRICULAR WALL MOTION STUDY LEFT VENTRICULAR EJECTION FRACTION TECHNIQUE: Standard myocardial SPECT imaging was performed after resting intravenous injection of 10 mCi Tc-77m tetrofosmin. Subsequently, intravenous infusion of Lexiscan was performed under the supervision of the Cardiology staff. At peak effect of the drug, 30 mCi Tc-25m tetrofosmin was injected intravenously and standard myocardial SPECT imaging was performed. Quantitative gated imaging was also performed to evaluate left ventricular wall motion, and estimate left ventricular ejection fraction. COMPARISON:  None. FINDINGS: Perfusion: No decreased activity in the left ventricle on stress imaging to suggest reversible ischemia or infarction. Wall Motion: Normal left ventricular wall motion. No left ventricular dilation. Left Ventricular Ejection Fraction: 76 % End diastolic volume 97 ml End systolic volume 23 ml IMPRESSION: 1. No reversible ischemia or infarction. 2. Normal left ventricular wall motion. 3. Left ventricular ejection fraction 76% 4. Non invasive risk stratification*: Low *2012 Appropriate Use Criteria for Coronary Revascularization Focused Update: J Am Coll  Cardiol. 2012;59(9):857-881. http://content.[email or web address]?[email or web address] Electronically Signed   By: Myles Rosenthal M.D.   On: 06/11/2016 15:26   Dg Chest Port 1 View  Result Date: 06/12/2016 CLINICAL DATA:  Pain after fall.  Right flank pain. EXAM: PORTABLE CHEST 1 VIEW COMPARISON:  June 10, 2016 FINDINGS: No pneumothorax. Stable cardiomegaly. The hila and mediastinum are unremarkable. Probable tiny left effusion. Bibasilar opacities remain, mildly worsened in the interval. Coarsened interstitial opacities remain, unchanged. IMPRESSION: 1. Mild increased bibasilar opacities may represent atelectasis or early infiltrate. Recommend clinical correlation and follow-up to resolution. 2. No pneumothorax. 3. Coarsened interstitium, unchanged. Electronically Signed   By: Gerome Sam III M.D   On: 06/12/2016 07:16  Scheduled Meds: . amoxicillin-clavulanate  1 tablet Oral Q12H  . diltiazem  120 mg Oral Daily  . enoxaparin (LOVENOX) injection  40 mg Subcutaneous Q24H  . ibuprofen  600 mg Oral TID  . methocarbamol  750 mg Oral TID  . mometasone-formoterol  2 puff Inhalation BID  . pantoprazole  40 mg Oral Daily   Or  . pantoprazole (PROTONIX) IV  40 mg Intravenous Daily  . senna-docusate  1 tablet Oral BID  . sodium chloride flush  3 mL Intravenous Q12H  . tiotropium  18 mcg Inhalation Daily   Continuous Infusions: . dextrose 5 % and 0.45 % NaCl with KCl 20 mEq/L 50 mL/hr at 06/11/16 2000     LOS: 3 days    Time spent: 40 minutes    THOMPSON,Zymere, MD [facility] Pager [phone]  If 7PM-7AM, please contact night-coverage [email or web address] Password [facility] 06/12/2016, 9:44 AM

## 2016-06-12 NOTE — Progress Notes (Addendum)
Subjective: Denies SOB this morning and has minimal pain. 02 sats still low  Objective: Vital signs in last 24 hours: Temp:  [98.1 F (36.7 C)-98.2 F (36.8 C)] 98.1 F (36.7 C) (04/08 0737) Pulse Rate:  [70-89] 71 (04/08 0819) Resp:  [14-19] 16 (04/08 0819) BP: (118-141)/(67-92) 127/92 (04/08 0737) SpO2:  [90 %-99 %] 91 % (04/08 0819) Weight:  [79 kg (174 lb 2.6 oz)] 79 kg (174 lb 2.6 oz) (04/08 0415) Last BM Date:  (PTA)  Intake/Output from previous day: 04/07 0701 - 04/08 0700 In: 1560 [P.O.:360; I.V.:1200] Out: 1750 [Urine:1750] Intake/Output this shift: Total I/O In: 510 [P.O.:360; I.V.:150] Out: -   Exam: Awake, alert, and oriented Lungs mostly clear CV tachy Abdomen soft, NT  Lab Results:   Recent Labs  06/11/16 0210 06/12/16 0218  WBC 8.2 5.9  HGB 13.4 12.2*  HCT 40.8 37.0*  PLT 148* 136*   BMET  Recent Labs  06/11/16 0210 06/12/16 0218  NA 135 135  K 4.3 3.9  CL 98* 102  CO2 28 26  GLUCOSE 111* 92  BUN 15 11  CREATININE 0.95 0.90  CALCIUM 8.5* 8.0*   PT/INR No results for input(s): LABPROT, INR in the last 72 hours. ABG No results for input(s): PHART, HCO3 in the last 72 hours.  Invalid input(s): PCO2, PO2  Studies/Results: Nm Myocar Multi W/spect W/wall Motion / Ef  Result Date: 06/11/2016 CLINICAL DATA:  Syncope.  Smoker. EXAM: MYOCARDIAL IMAGING WITH SPECT (REST AND PHARMACOLOGIC-STRESS) GATED LEFT VENTRICULAR WALL MOTION STUDY LEFT VENTRICULAR EJECTION FRACTION TECHNIQUE: Standard myocardial SPECT imaging was performed after resting intravenous injection of 10 mCi Tc-49m tetrofosmin. Subsequently, intravenous infusion of Lexiscan was performed under the supervision of the Cardiology staff. At peak effect of the drug, 30 mCi Tc-76m tetrofosmin was injected intravenously and standard myocardial SPECT imaging was performed. Quantitative gated imaging was also performed to evaluate left ventricular wall motion, and estimate left  ventricular ejection fraction. COMPARISON:  None. FINDINGS: Perfusion: No decreased activity in the left ventricle on stress imaging to suggest reversible ischemia or infarction. Wall Motion: Normal left ventricular wall motion. No left ventricular dilation. Left Ventricular Ejection Fraction: 76 % End diastolic volume 97 ml End systolic volume 23 ml IMPRESSION: 1. No reversible ischemia or infarction. 2. Normal left ventricular wall motion. 3. Left ventricular ejection fraction 76% 4. Non invasive risk stratification*: Low *2012 Appropriate Use Criteria for Coronary Revascularization Focused Update: J Am Coll Cardiol. 2012;59(9):857-881. http://content.[email or web address]?[email or web address] Electronically Signed   By: Myles Rosenthal M.D.   On: 06/11/2016 15:26   Dg Chest Port 1 View  Result Date: 06/12/2016 CLINICAL DATA:  Pain after fall.  Right flank pain. EXAM: PORTABLE CHEST 1 VIEW COMPARISON:  June 10, 2016 FINDINGS: No pneumothorax. Stable cardiomegaly. The hila and mediastinum are unremarkable. Probable tiny left effusion. Bibasilar opacities remain, mildly worsened in the interval. Coarsened interstitial opacities remain, unchanged. IMPRESSION: 1. Mild increased bibasilar opacities may represent atelectasis or early infiltrate. Recommend clinical correlation and follow-up to resolution. 2. No pneumothorax. 3. Coarsened interstitium, unchanged. Electronically Signed   By: Gerome Sam III M.D   On: 06/12/2016 07:16    Anti-infectives: Anti-infectives    Start     Dose/Rate Route Frequency Ordered Stop   06/09/16 1545  amoxicillin-clavulanate (AUGMENTIN) 875-125 MG per tablet 1 tablet     1 tablet Oral Every 12 hours 06/09/16 1539        Assessment/Plan:  s/p syncopal episode with fall, multiple right rib fractures  Pulmonary status remains an issue and syncopal workup on going. Pt, however, wants to sign out and leave AMA. I tried to discourage him from this.  I discussed the risk  on worsening pulmonary condition, pneumonia, and even death if he leaves. He still says he will decide what is best for him despite my explanation.   Yadir Zentner A 06/12/2016

## 2016-06-13 DIAGNOSIS — I495 Sick sinus syndrome: Principal | ICD-10-CM

## 2016-06-13 DIAGNOSIS — I459 Conduction disorder, unspecified: Secondary | ICD-10-CM

## 2016-06-13 MED ORDER — CHLORHEXIDINE GLUCONATE 4 % EX LIQD
60.0000 mL | Freq: Once | CUTANEOUS | Status: AC
  Administered 2016-06-14: 4 via TOPICAL
  Filled 2016-06-13: qty 60

## 2016-06-13 MED ORDER — SODIUM CHLORIDE 0.9 % IV SOLN
INTRAVENOUS | Status: DC
  Administered 2016-06-14: 06:00:00 via INTRAVENOUS

## 2016-06-13 MED ORDER — SODIUM CHLORIDE 0.9 % IR SOLN
80.0000 mg | Status: AC
  Administered 2016-06-14: 80 mg
  Filled 2016-06-13: qty 2

## 2016-06-13 MED ORDER — CEFAZOLIN SODIUM-DEXTROSE 2-4 GM/100ML-% IV SOLN
2.0000 g | INTRAVENOUS | Status: AC
  Administered 2016-06-14: 2 g via INTRAVENOUS
  Filled 2016-06-13: qty 100

## 2016-06-13 NOTE — Progress Notes (Signed)
Patient has elected to stay in the hospital until procedure tomorrow.  Patrick Kelly. Gae Bon, MD, FACS [phone] Trauma Surgeon

## 2016-06-13 NOTE — Progress Notes (Addendum)
Noted CM Referral on 06/12/16 for assistance with PCP.  This is a duplicate referral.   Met with pt on 06/10/16 regarding PCP assistance.  Please see documentation on 06/10/16.    Reinaldo Raddle, RN, BSN  Trauma/Neuro ICU Case Manager [phone]

## 2016-06-13 NOTE — Clinical Social Work Note (Signed)
Clinical Social Worker continuing to follow patient and family for support and discharge planning needs.  Patient is adamant about returning home and would like to do so today.  Patient awaiting to see EP regarding permanent pacemaker.  Clinical Social Worker will sign off for now as social work intervention is no longer needed. Please consult [place] again if new need arises.  Macario Golds, [place] [phone]

## 2016-06-13 NOTE — Progress Notes (Signed)
qPhysical Therapy Treatment Patient Details Name: Patrick Kelly MRN: [id number] DOB: 1950/08/30 Today's Date: 06/13/2016    History of Present Illness Pt is a 66 y/o male admitted after sustaining a fall down a flight of stairs secondary to a syncopal episode. +LOC. Pt with R 6-10 rib fxs. PMH including but not limited to tobacco use.    PT Comments    Patient seen for mobility progression. Tolerated well, no physical assist required. OF NOTE: ambulated on room air with desaturation into mid 80s, 1 liter supplemental O2 sub 90%, 2 liters O2 improved to 93%. Nsg aware. Discharge plan updated as patient is able to mobilize well today. DO not feel patient will required follow up at this time, recommend continued mobility.   Follow Up Recommendations  No PT follow up;Supervision/Assistance - 24 hour     Equipment Recommendations  None recommended by PT;Other (comment) (defer to next venue)    Recommendations for Other Services       Precautions / Restrictions Precautions Precautions: Fall Precaution Comments: R rib fxs Restrictions Weight Bearing Restrictions: No    Mobility  Bed Mobility Overal bed mobility: Needs Assistance Bed Mobility: Rolling;Sidelying to Sit;Sit to Sidelying Rolling: Min guard Sidelying to sit: Min guard     Sit to sidelying: Min guard General bed mobility comments: Min guard for safety with VCs for positioning  Transfers Overall transfer level: Needs assistance Equipment used: None Transfers: Sit to/from Stand Sit to Stand: Supervision         General transfer comment: supervision for safety no physical assist required  Ambulation/Gait Ambulation/Gait assistance: Supervision Ambulation Distance (Feet): 210 Feet Assistive device: None Gait Pattern/deviations: WFL(Within Functional Limits) Gait velocity: decreased   General Gait Details: slow cadence. ambulated on room air, desaturated to mid 80s, increased to 1 liters remained sub 90%,  placed on 2 liters and improved to 93% with ambulation. Cues for pursed lip breathing.   Stairs            Wheelchair Mobility    Modified Rankin (Stroke Patients Only)       Balance Overall balance assessment: Needs assistance Sitting-balance support: Feet supported Sitting balance-Leahy Scale: Good       Standing balance-Leahy Scale: Fair Standing balance comment: no physical assist or support                            Cognition Arousal/Alertness: Awake/alert Behavior During Therapy: WFL for tasks assessed/performed Overall Cognitive Status: Within Functional Limits for tasks assessed                                        Exercises      General Comments        Pertinent Vitals/Pain Faces Pain Scale: Hurts even more Pain Location: R ribs Pain Descriptors / Indicators: Sore;Grimacing;Guarding    Home Living                      Prior Function            PT Goals (current goals can now be found in the care plan section) Acute Rehab PT Goals Patient Stated Goal: decrease pain PT Goal Formulation: With patient Time For Goal Achievement: 06/24/16 Potential to Achieve Goals: Fair Progress towards PT goals: Progressing toward goals    Frequency    Min 3X/week  PT Plan Current plan remains appropriate    Co-evaluation             End of Session Equipment Utilized During Treatment: Oxygen Activity Tolerance: Patient limited by pain;Patient limited by fatigue Patient left: in bed;with call bell/phone within reach Nurse Communication: Mobility status PT Visit Diagnosis: Other abnormalities of gait and mobility (R26.89);History of falling (Z91.81);Pain Pain - Right/Left: Right Pain - part of body:  (ribs)     Time: 6045-4098 PT Time Calculation (min) (ACUTE ONLY): 18 min  Charges:  $Gait Training: 8-22 mins                    G Codes:       Charlotte Crumb, PT DPT  [phone]    Patrick Kelly 06/13/2016, 9:03 AM

## 2016-06-13 NOTE — Progress Notes (Signed)
Occupational Therapy Evaluation Patient Details Name: FUTURE YELDELL MRN: [id number] DOB: 29-Jul-1950 Today's Date: 06/13/2016    History of Present Illness Pt is a 66 y/o male admitted after sustaining a fall down a flight of stairs secondary to a syncopal episode. +LOC. Pt with R 6-10 rib fxs. PMH including but not limited to tobacco use.   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 Completed education regarding compensatory techniques for ADL and reducing risk of falls. Pt/family verbalized understanding. Pt safe to DC home with intermittent S when medically stable. OT signing off.     Follow Up Recommendations  No OT follow up;Supervision - Intermittent    Equipment Recommendations  Tub/shower seat    Recommendations for Other Services       Precautions / Restrictions Precautions Precautions: Fall Precaution Comments: R rib fxs Restrictions Weight Bearing Restrictions: No      Mobility Bed Mobility Overal bed mobility: Modified Independent  Educated on use of "splinting" with pillow to reduce pain during bed mobility/coughing                Transfers Overall transfer level: Needs assistance Equipment used: None Transfers: Sit to/from Stand Sit to Stand: Supervision         General transfer comment: supervision for safety no physical assist required    Balance Overall balance assessment: Needs assistance Sitting-balance support: Feet supported Sitting balance-Leahy Scale: Good Sitting balance - Comments: pt reliant on at least one UE support and min A     Standing balance-Leahy Scale: Fair Standing balance comment: no physical assist or support                           ADL either performed or assessed with clinical judgement   ADL Overall ADL's : Needs assistance/impaired                                     Functional mobility during ADLs: Supervision/safety General ADL  Comments: Completed education with pt/family regarding initial recommendation of S during ADL and use of shower chair for bathing. Family states they will pick up a shower chair if there is not one at home. Educated pt/family on reducing risk of falls and compensatory technqiues for ADL tasks. Pt able to cross feet over knees to complete LB ADL. Educated pt on use of incentive spriometer. Pt sats @ 88-90 during ADL. Pt educated on pursed lip breathing.      Vision         Perception     Praxis      Pertinent Vitals/Pain Pain Assessment: Faces Faces Pain Scale: Hurts little more Pain Location: R ribs Pain Descriptors / Indicators: Sore;Grimacing;Guarding Pain Intervention(s): Limited activity within patient's tolerance     Hand Dominance Right   Extremity/Trunk Assessment Upper Extremity Assessment Upper Extremity Assessment: Overall WFL for tasks assessed   Lower Extremity Assessment Lower Extremity Assessment: Overall WFL for tasks assessed   Cervical / Trunk Assessment Cervical / Trunk Assessment: Normal   Communication Communication Communication: No difficulties   Cognition Arousal/Alertness: Awake/alert Behavior During Therapy: WFL for tasks assessed/performed Overall Cognitive Status: Within Functional Limits for tasks assessed  General Comments       Exercises     Shoulder Instructions      Home Living Family/patient expects to be discharged to:: Private residence Living Arrangements: Alone Available Help at Discharge: Family;Friend(s);Available PRN/intermittently Type of Home: Apartment Home Access: Stairs to enter Entrance Stairs-Number of Steps: flight Entrance Stairs-Rails: Right;Left;Can reach both Home Layout: One level     Bathroom Shower/Tub: Tub/shower unit;Walk-in shower   Bathroom Toilet: Standard         Additional Comments: Pt states his Mom has a shower chair      Prior  Functioning/Environment Level of Independence: Independent        Comments: works at [employer]        OT Problem List: Decreased activity tolerance;Decreased knowledge of use of DME or AE;Cardiopulmonary status limiting activity;Pain      OT Treatment/Interventions:      OT Goals(Current goals can be found in the care plan section) Acute Rehab OT Goals Patient Stated Goal: return to living alone OT Goal Formulation: All assessment and education complete, DC therapy  OT Frequency:     Barriers to D/C:            Co-evaluation              End of Session Nurse Communication: Mobility status  Activity Tolerance: Patient tolerated treatment well Patient left: in chair;with call bell/phone within reach;with family/visitor present  OT Visit Diagnosis: Unsteadiness on feet (R26.81)                Time: 1202-1226 OT Time Calculation (min): 24 min Charges:  OT General Charges $OT Visit: 1 Procedure OT Evaluation $OT Eval Moderate Complexity: 1 Procedure G-Codes:     Chrissie Dacquisto, OT/L  [phone] 06/13/2016  Sheri Gatchel,HILLARY 06/13/2016, 12:34 PM

## 2016-06-13 NOTE — Progress Notes (Signed)
Trauma Service Note  Subjective: Patient wants to go home.  He has demonstrated some long cardiac pauses on monitoring   The recommendation has been made for the patient to get a permanent pacemaker.  However, the patient is adamant about going home today [place]  Objective: Vital signs in last 24 hours: Temp:  [97.7 F (36.5 C)-98.2 F (36.8 C)] 97.7 F (36.5 C) (04/09 0745) Pulse Rate:  [66-77] 66 (04/09 0800) Resp:  [13-20] 19 (04/09 0800) BP: (112-141)/(69-90) 126/90 (04/09 0745) SpO2:  [89 %-94 %] 90 % (04/09 0900) Last BM Date:  (PTA)  Intake/Output from previous day: 04/08 0701 - 04/09 0700 In: 2110 [P.O.:960; I.V.:1150] Out: 800 [Urine:800] Intake/Output this shift: No intake/output data recorded.  General: No acute distress  Lungs: Clear and equal  Abd: Soft, benign  Extremities: No changes  Neuro: Intact  Lab Results: CBC   Recent Labs  06/11/16 0210 06/12/16 0218  WBC 8.2 5.9  HGB 13.4 12.2*  HCT 40.8 37.0*  PLT 148* 136*   BMET  Recent Labs  06/11/16 0210 06/12/16 0218  NA 135 135  K 4.3 3.9  CL 98* 102  CO2 28 26  GLUCOSE 111* 92  BUN 15 11  CREATININE 0.95 0.90  CALCIUM 8.5* 8.0*   PT/INR No results for input(s): LABPROT, INR in the last 72 hours. ABG No results for input(s): PHART, HCO3 in the last 72 hours.  Invalid input(s): PCO2, PO2  Studies/Results: Nm Myocar Multi W/spect W/wall Motion / Ef  Result Date: 06/11/2016 CLINICAL DATA:  Syncope.  Smoker. EXAM: MYOCARDIAL IMAGING WITH SPECT (REST AND PHARMACOLOGIC-STRESS) GATED LEFT VENTRICULAR WALL MOTION STUDY LEFT VENTRICULAR EJECTION FRACTION TECHNIQUE: Standard myocardial SPECT imaging was performed after resting intravenous injection of 10 mCi Tc-48m tetrofosmin. Subsequently, intravenous infusion of Lexiscan was performed under the supervision of the Cardiology staff. At peak effect of the drug, 30 mCi Tc-68m tetrofosmin was injected intravenously  and standard myocardial SPECT imaging was performed. Quantitative gated imaging was also performed to evaluate left ventricular wall motion, and estimate left ventricular ejection fraction. COMPARISON:  None. FINDINGS: Perfusion: No decreased activity in the left ventricle on stress imaging to suggest reversible ischemia or infarction. Wall Motion: Normal left ventricular wall motion. No left ventricular dilation. Left Ventricular Ejection Fraction: 76 % End diastolic volume 97 ml End systolic volume 23 ml IMPRESSION: 1. No reversible ischemia or infarction. 2. Normal left ventricular wall motion. 3. Left ventricular ejection fraction 76% 4. Non invasive risk stratification*: Low *2012 Appropriate Use Criteria for Coronary Revascularization Focused Update: J Am Coll Cardiol. 2012;59(9):857-881. http://[email or web address]?articleid=[email or web address] Electronically Signed   By: Myles Rosenthal M.D.   On: 06/11/2016 15:26   Dg Chest Port 1 View  Result Date: 06/12/2016 CLINICAL DATA:  Pain after fall.  Right flank pain. EXAM: PORTABLE CHEST 1 VIEW COMPARISON:  June 10, 2016 FINDINGS: No pneumothorax. Stable cardiomegaly. The hila and mediastinum are unremarkable. Probable tiny left effusion. Bibasilar opacities remain, mildly worsened in the interval. Coarsened interstitial opacities remain, unchanged. IMPRESSION: 1. Mild increased bibasilar opacities may represent atelectasis or early infiltrate. Recommend clinical correlation and follow-up to resolution. 2. No pneumothorax. 3. Coarsened interstitium, unchanged. Electronically Signed   By: Gerome Sam III M.D   On: 06/12/2016 07:16    Anti-infectives: Anti-infectives    Start     Dose/Rate Route Frequency Ordered Stop   06/09/16 1545  amoxicillin-clavulanate (AUGMENTIN) 875-125 MG per tablet 1 tablet  1 tablet Oral Every 12 hours 06/09/16 1539        Assessment/Plan: s/p  Awaiting discussion with the Cardiologist.  I do not feel comfortable  sending this patient home with this history of syncope and 5 sec pauses.  LOS: 4 days   Marta Lamas. Gae Bon, MD, FACS [phone] Trauma Surgeon 06/13/2016

## 2016-06-13 NOTE — Consult Note (Signed)
ELECTROPHYSIOLOGY CONSULT NOTE    Patient ID: Patrick Kelly MRN: [id number], DOB/AGE: 06/08/1950 66 y.o.  Admit date: 06/09/2016 Date of Consult: 06/13/2016  Primary Physician: No PCP Per Patient Primary Cardiologist: new (Dr Tenny Craw)  Reason for Consultation: syncope, pauses on telemetry  HPI:  Patrick Kelly is a 66 y.o. male is referred by Dr Eden Emms for evaluation of syncope and sinus node dysfunction.  He has no significant past medical history and has not been seen by a physician in many years. He was going to work the day of admission, walked out of his house and awoke at the bottom of a flight of stairs.  He does not remember tripping. He had no prodrome.  No palpitations, chest pain, or shortness of breath.  One of his neighbors found him at the bottom of the stairs and 911 was called. He was transported to [facility] for further evaluation. He has a longstanding history of palpitations but no documented arrhythmias. He has never had prior syncope. He does have intermittent dizzy spells that are not always orthostatic in nature. EP has been asked to evaluate for treatment options.  Lexiscan myoview this admission with no ischemia. Echo with normal EF and no RWMA.   He currently is very anxious to go home. He denies chest pain, shortness of breath, LE edema, recent fevers, chills, nausea or vomiting.   Past Medical History:  Diagnosis Date  . Abnormal CT of the chest    emphysemetous changes suggestive copd  . Dental caries   . Multiple fractures of ribs   . Pulmonary nodule   . Sinusitis   . Syncope   . Tobacco use      Surgical History: History reviewed. No pertinent surgical history.   No prescriptions prior to admission.    Inpatient Medications: . amoxicillin-clavulanate  1 tablet Oral Q12H  . diltiazem  120 mg Oral Daily  . enoxaparin (LOVENOX) injection  40 mg Subcutaneous Q24H  . ibuprofen  600 mg Oral TID  . methocarbamol  750 mg Oral TID  . mometasone-formoterol   2 puff Inhalation BID  . nicotine  21 mg Transdermal Daily  . pantoprazole  40 mg Oral Daily   Or  . pantoprazole (PROTONIX) IV  40 mg Intravenous Daily  . senna-docusate  1 tablet Oral BID  . sodium chloride flush  3 mL Intravenous Q12H  . tiotropium  18 mcg Inhalation Daily    Allergies: No Known Allergies  Social History   Social History  . Marital status: Single    Spouse name: N/A  . Number of children: N/A  . Years of education: N/A   Occupational History  . Not on file.   Social History Main Topics  . Smoking status: Current Every Day Smoker  . Smokeless tobacco: Never Used  . Alcohol use No  . Drug use: No  . Sexual activity: Not on file   Other Topics Concern  . Not on file   Social History Narrative  . No narrative on file     Family History  Problem Relation Age of Onset  . Hypertension Sister      Review of Systems: All other systems reviewed and are otherwise negative except as noted above.  Physical Exam: Vitals:   06/13/16 0332 06/13/16 0745 06/13/16 0800 06/13/16 0900  BP: (!) 141/84 126/90    Pulse: 69 66 66   Resp: Temp: 97.9 F (36.6 C) 97.7 F (36.5 C)  TempSrc: Oral Oral    SpO2: 91% 90% (!) 89% 90%  Weight:      Height:        GEN- The patient is chronically ill and disheveled appearing, alert and oriented x 3 today.   HEENT: normocephalic, atraumatic; sclera clear, conjunctiva pink; hearing intact; oropharynx clear; neck supple, multiple facial lacerations, poor dentition  Lungs- Clear to ausculation bilaterally, normal work of breathing.  No wheezes, rales, rhonchi Heart- Regular rate and rhythm  GI- soft, non-tender, non-distended, bowel sounds present Extremities- no clubbing, cyanosis, or edema MS- no significant deformity or atrophy Skin- warm and dry, no rash or lesion Psych- euthymic mood, full affect Neuro- strength and sensation are intact  Labs:  Lab Results  Component Value Date   WBC 5.9  06/12/2016   HGB 12.2 (L) 06/12/2016   HCT 37.0 (L) 06/12/2016   MCV 97.9 06/12/2016   PLT 136 (L) 06/12/2016    Recent Labs Lab 06/09/16 1156  06/12/16 0218  NA 134*  < > 135  K 4.0  < > 3.9  CL 102  < > 102  CO2 25  < > 26  BUN 11  < > 11  CREATININE 0.97  < > 0.90  CALCIUM 8.8*  < > 8.0*  PROT 6.3*  --   --   BILITOT 0.9  --   --   ALKPHOS 89  --   --   ALT 7*  --   --   AST 19  --   --   GLUCOSE 91  < > 92  < > = values in this interval not displayed.    Radiology/Studies: Ct Head Wo Contrast Result Date: 06/09/2016 CLINICAL DATA:  Fall down stairs. Forehead abrasion, bloody nose, missing teeth. Initial encounter. EXAM: CT HEAD WITHOUT CONTRAST CT MAXILLOFACIAL WITHOUT CONTRAST CT CERVICAL SPINE WITHOUT CONTRAST TECHNIQUE: Multidetector CT imaging of the head, cervical spine, and maxillofacial structures were performed using the standard protocol without intravenous contrast. Multiplanar CT image reconstructions of the cervical spine and maxillofacial structures were also generated. COMPARISON:  None. FINDINGS: CT HEAD FINDINGS Brain: No evidence of acute infarction, hemorrhage, hydrocephalus, extra-axial collection or mass lesion/mass effect. Vascular: No hyperdense vessel or unexpected calcification. Skull: Normal. Negative for fracture or focal lesion. CT MAXILLOFACIAL FINDINGS Osseous: Remote left superior orbital rim repair. No acute fracture. Very poor dentition with multiple devitalized teeth and periapical erosions. There is extensive loss of the alveolar ridge involving the mandible. Orbits: No posttraumatic finding. Sinuses: Fluid levels in the right frontal sinus. Both maxillary sinuses are atelectatic and partially opacified. Soft tissues: No opaque foreign body or hematoma. CT CERVICAL SPINE FINDINGS Alignment: No traumatic malalignment. Skull base and vertebrae: Negative for acute fracture. Soft tissues and spinal canal: No prevertebral fluid or swelling. No visible canal  hematoma. Disc levels: C5-6 and C6-7 uncovertebral spurring and foraminal narrowing. Upper chest: Biapical emphysema. IMPRESSION: 1. No evidence of intracranial or cervical spine injury. 2. Negative for facial fracture. 3. 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 A superimposed tooth fracture would not be readily detectable. 4. Sinusitis. Electronically Signed   By: Marnee Spring M.D.   On: 06/09/2016 13:54   Ct Chest W Contrast Result Date: 06/09/2016 CLINICAL DATA:  Larey Seat down stairs. EXAM: CT CHEST, ABDOMEN, AND PELVIS WITH CONTRAST TECHNIQUE: Multidetector CT imaging of the chest, abdomen and pelvis was performed following the standard protocol during bolus administration of intravenous contrast. CONTRAST:  ISOVUE-300 IOPAMIDOL (ISOVUE-300) INJECTION 61%  COMPARISON:  None. FINDINGS: CT CHEST FINDINGS Cardiovascular: Normal heart size. No pericardial effusion. The thoracic aorta appears intact. Mediastinum/Nodes: The trachea appears patent and is midline. Normal appearance of the esophagus. Prominent mediastinal lymph nodes identified. Left paratracheal node measures 8 mm. 1.3 cm right hilar lymph node is identified. The 1 cm sub- carinal lymph node identified. Lungs/Pleura: No pleural effusion. Moderate to advanced changes of centrilobular and paraseptal emphysema. No pneumothorax or pulmonary contusion. 6 mm anteromedial right upper lobe lung nodule is identified, image 40 of series 203. Dependent changes and subsegmental atelectasis noted in the lung bases. Musculoskeletal: There are acute nondisplaced right sixth through tenth rib fractures. No acute left rib fractures. The sternum appears intact. There is a fracture deformity involving the T4 vertebra nondisplaced scratch set mild nondisplaced fracture involving the inferior endplate of T4 is suspected. CT ABDOMEN PELVIS FINDINGS Hepatobiliary: Hemangioma within the posterior right lobe of liver measures 3.6 cm. Within segment 6  there is a larger hemangioma measuring 3.6 cm. The liver is otherwise intact without evidence for rupture. Stones identified within the neck of gallbladder. No biliary dilatation. Pancreas: Unremarkable. No pancreatic ductal dilatation or surrounding inflammatory changes. Spleen: Normal in size without focal abnormality. Adrenals/Urinary Tract: Adreniform enlargement of bilateral adrenal glands. No hemorrhage. The kidneys appear intact. Right kidney cyst noted. Several cystic structures are identified within the left side of pelvis. Favor large bladder diverticula. Urinary bladder otherwise appears normal. Stomach/Bowel: The stomach is normal. The small bowel loops have a normal course and caliber. The appendix is visualized and appears normal. No pathologic dilatation of the colon. Vascular/Lymphatic: Aortic atherosclerosis. No upper abdominal adenopathy. No pelvic or inguinal adenopathy. Reproductive:   Prostate gland is enlarged. Other: No free fluid or fluid collections. Musculoskeletal: Sclerotic lesion in the right iliac wing is favored to represent a benign bone island. 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 IMPRESSION: 1. Multiple acute right rib fracture deformities. There is also an age indeterminate T4 fracture deformity. 2. No evidence for solid organ injury within the abdomen or pelvis. 3. Diffuse bronchial wall thickening with emphysema, as above; imaging findings suggestive of underlying COPD. 4. Pulmonary nodule in the right lung measures 6 mm. Non-contrast chest CT at 6-12 months is recommended. If the nodule is stable at time of repeat CT, then future CT at 18-24 months (from today's scan) is considered optional for low-risk patients, but is recommended for high-risk patients. This recommendation follows the consensus statement: Guidelines for Management of Incidental Pulmonary Nodules Detected on CT Images:From the Fleischner Society  2017; published online before print (10.1148/radiol.1610960454). 5. Liver hemangiomas. 6. Gallstones 7. Prostate gland enlargement and bladder diverticula. Electronically Signed   By: Signa Kell M.D.   On: 06/09/2016 13:53   Nm Myocar Multi W/spect W/wall Motion / Ef Result Date: 06/11/2016 CLINICAL DATA:  Syncope.  Smoker. EXAM: MYOCARDIAL IMAGING WITH SPECT (REST AND PHARMACOLOGIC-STRESS) GATED LEFT VENTRICULAR WALL MOTION STUDY LEFT VENTRICULAR EJECTION FRACTION TECHNIQUE: Standard myocardial SPECT imaging was performed after resting intravenous injection of 10 mCi Tc-40m tetrofosmin. Subsequently, intravenous infusion of Lexiscan was performed under the supervision of the Cardiology staff. At peak effect of the drug, 30 mCi Tc-41m tetrofosmin was injected intravenously and standard myocardial SPECT imaging was performed. Quantitative gated imaging was also performed to evaluate left ventricular wall motion, and estimate left ventricular ejection fraction. COMPARISON:  None. FINDINGS: Perfusion: No decreased activity in the left ventricle on stress imaging to  suggest reversible ischemia or infarction. Wall Motion: Normal left ventricular wall motion. No left ventricular dilation. Left Ventricular Ejection Fraction: 76 % End diastolic volume 97 ml End systolic volume 23 ml IMPRESSION: 1. No reversible ischemia or infarction. 2. Normal left ventricular wall motion. 3. Left ventricular ejection fraction 76% 4. Non invasive risk stratification*: Low *2012 Appropriate Use Criteria for Coronary Revascularization Focused Update: J Am Coll Cardiol. 2012;59(9):857-881. http://content.[email or web address]?articleid=[email or web address] Electronically Signed   By: Myles Rosenthal M.D.   On: 06/11/2016 15:26   ZOX:WRUEAV fibrillation with RVR, V rate 140 (personally reviewed)  TELEMETRY: sinus rhythm, sinus node dysfunction with up to 8 second sinus pauses with no prolongation of R-R intervals prior to pause, intermittent AF  with RVR (personally reviewed)  Assessment/Plan: 1.  Syncope/sinus node dysfunction The patient presents with syncope and has had sinus node dysfunction with pauses of up to 8 seconds documented on telemetry while here.  He is on no AVN blocking agents.  Myoview without ischemia, echo normal.  I do not think pauses are vagally mediated because there is no P-P or R-R prolongation prior to pause. He also has multiple other sinus pauses of up to 4 seconds with junctional escape beats.   I have therefore recommended pacemaker implantation. Risks, benefits reviewed with the patient who wishes to proceed. He would however, like to go home tonight and come back tomorrow for procedure.  Dr Lindie Spruce and myself had a long discussion with the patient regarding our concerns for leaving the hospital and having recurrent syncope without back-up pacing in place. He is fairly adamant in his desire to go home. Patrick Kelly have Dr Elberta Fortis see later today. If patient accepts risks of leaving hospital, Patrick Kelly plan to discharge per primary team and proceed with pacemaker implant tomorrow as an outpatient with Dr [name].  No driving  2.  Newly diagnosed atrial fibrillation V rates are elevated but unable to use rate control until back up brady pacing in place CHADS2VASC is 1 - Patrick Kelly follow for now  Dr Elberta Fortis to see later today    Signed, Gypsy Balsam, NP 06/13/2016 11:24 AM    I have seen and examined this patient with Gypsy Balsam.  Agree with above, note added to reflect my findings.  On exam, RRR, no murmurs, lungs clear. Presented to the hospital with an episode of syncope. Most found to have both atrial fibrillation and up to 8 second pauses on his telemetry. Due to that, he would qualify for a pacemaker for treatment of his syncope. Discussed with him risks and benefits. Risks include bleeding, tamponade, heart block, and stroke among others. He has agreed to the procedure. We'll plan for tomorrow. He is insistent on  leaving the hospital tonight and coming back for the procedure tomorrow. We have advised him that it would be smart to stay in the hospital. He continues to insist on leaving the hospital. Tinzlee Craker arrange for him to have an outpatient pacemaker tomorrow.    Shivaan Tierno M. Shahla Betsill MD 06/13/2016 2:25 PM

## 2016-06-13 NOTE — Progress Notes (Signed)
Progress Note  Patient Name: Patrick Kelly Date of Encounter: 06/13/2016  Primary Cardiologist: Tenny Craw  Subjective   Feels ok wants to go home   Inpatient Medications    Scheduled Meds: . amoxicillin-clavulanate  1 tablet Oral Q12H  . diltiazem  120 mg Oral Daily  . enoxaparin (LOVENOX) injection  40 mg Subcutaneous Q24H  . ibuprofen  600 mg Oral TID  . methocarbamol  750 mg Oral TID  . mometasone-formoterol  2 puff Inhalation BID  . nicotine  21 mg Transdermal Daily  . pantoprazole  40 mg Oral Daily   Or  . pantoprazole (PROTONIX) IV  40 mg Intravenous Daily  . senna-docusate  1 tablet Oral BID  . sodium chloride flush  3 mL Intravenous Q12H  . tiotropium  18 mcg Inhalation Daily   Continuous Infusions: . dextrose 5 % and 0.45 % NaCl with KCl 20 mEq/L Stopped (06/13/16 0600)   PRN Meds: acetaminophen, alum & mag hydroxide-simeth, hydrALAZINE, HYDROmorphone (DILAUDID) injection, levalbuterol, LORazepam, nitroGLYCERIN, ondansetron **OR** ondansetron (ZOFRAN) IV, oxyCODONE   Vital Signs    Vitals:   06/13/16 0332 06/13/16 0745 06/13/16 0800 06/13/16 0900  BP: (!) 141/84 126/90    Pulse: 69 66 66   Resp: Temp: 97.9 F (36.6 C) 97.7 F (36.5 C)    TempSrc: Oral Oral    SpO2: 91% 90% (!) 89% 90%  Weight:      Height:        Intake/Output Summary (Last 24 hours) at 06/13/16 1116 Last data filed at 06/13/16 0600  Gross per 24 hour  Intake             1600 ml  Output              800 ml  Net              800 ml   Filed Weights   06/10/16 0316 06/11/16 0507 06/12/16 0415  Weight: 175 lb 7.8 oz (79.6 kg) 178 lb 9.2 oz (81 kg) 174 lb 2.6 oz (79 kg)    Telemetry    Reviewed media and telemetry and discussed with EP  Had 8 second sinus pause 4/6 am and shows Evidence of sinus note dysfunction with multiple 2-4 second pauses as well as idioventricular bets And NSVT  - Personally Reviewed  ECG    afib rate 140 normal QT no ischemic changes   -  Personally Reviewed  Physical Exam   GEN: No acute distress. Some facial trauma   Neck: No JVD Cardiac: RRR, no murmurs, rubs, or gallops.  Respiratory: Clear to auscultation bilaterally. GI: Soft, nontender, non-distended  MS: No edema; No deformity. Neuro:  Nonfocal  Psych: Normal affect   Labs    Chemistry Recent Labs Lab 06/09/16 1156 06/10/16 0232 06/11/16 0210 06/12/16 0218  NA 134* 135 135 135  K 4.0 4.2 4.3 3.9  CL 102 99* 98* 102  CO2 GLUCOSE 91 112* 111* 92  BUN CREATININE 0.97 0.90 0.95 0.90  CALCIUM 8.8* 8.8* 8.5* 8.0*  PROT 6.3*  --   --   --   ALBUMIN 3.4*  --   --   --   AST 19  --   --   --   ALT 7*  --   --   --   ALKPHOS 89  --   --   --   BILITOT 0.9  --   --   --  GFRNONAA >60 >60 >60 >60  GFRAA >60 >60 >60 >60  ANIONGAP Hematology Recent Labs Lab 06/10/16 0232 06/11/16 0210 06/12/16 0218  WBC 9.0 8.2 5.9  RBC 4.43 4.14* 3.78*  HGB 14.6 13.4 12.2*  HCT 43.5 40.8 37.0*  MCV 98.2 98.6 97.9  MCH 33.0 32.4 32.3  MCHC 33.6 32.8 33.0  RDW 13.4 13.3 13.1  PLT 187 148* 136*    Cardiac Enzymes Recent Labs Lab 06/09/16 2001 06/10/16 0650 06/10/16 1217 06/10/16 1850  TROPONINI <0.03 <0.03 <0.03 <0.03    Recent Labs Lab 06/09/16 1215 06/09/16 1624 06/09/16 1841  TROPIPOC 0.00 0.01 0.00     BNPNo results for input(s): BNP, PROBNP in the last 168 hours.   DDimer No results for input(s): DDIMER in the last 168 hours.   Radiology    Nm Myocar Multi W/spect W/wall Motion / Ef  Result Date: 06/11/2016 CLINICAL DATA:  Syncope.  Smoker. EXAM: MYOCARDIAL IMAGING WITH SPECT (REST AND PHARMACOLOGIC-STRESS) GATED LEFT VENTRICULAR WALL MOTION STUDY LEFT VENTRICULAR EJECTION FRACTION TECHNIQUE: Standard myocardial SPECT imaging was performed after resting intravenous injection of 10 mCi Tc-28m tetrofosmin. Subsequently, intravenous infusion of Lexiscan was performed under the supervision of the  Cardiology staff. At peak effect of the drug, 30 mCi Tc-30m tetrofosmin was injected intravenously and standard myocardial SPECT imaging was performed. Quantitative gated imaging was also performed to evaluate left ventricular wall motion, and estimate left ventricular ejection fraction. COMPARISON:  None. FINDINGS: Perfusion: No decreased activity in the left ventricle on stress imaging to suggest reversible ischemia or infarction. Wall Motion: Normal left ventricular wall motion. No left ventricular dilation. Left Ventricular Ejection Fraction: 76 % End diastolic volume 97 ml End systolic volume 23 ml IMPRESSION: 1. No reversible ischemia or infarction. 2. Normal left ventricular wall motion. 3. Left ventricular ejection fraction 76% 4. Non invasive risk stratification*: Low *2012 Appropriate Use Criteria for Coronary Revascularization Focused Update: J Am Coll Cardiol. 2012;59(9):857-881. http://content.[email or web address]?[email or web address] Electronically Signed   By: Myles Rosenthal M.D.   On: 06/11/2016 15:26   Dg Chest Port 1 View  Result Date: 06/12/2016 CLINICAL DATA:  Pain after fall.  Right flank pain. EXAM: PORTABLE CHEST 1 VIEW COMPARISON:  June 10, 2016 FINDINGS: No pneumothorax. Stable cardiomegaly. The hila and mediastinum are unremarkable. Probable tiny left effusion. Bibasilar opacities remain, mildly worsened in the interval. Coarsened interstitial opacities remain, unchanged. IMPRESSION: 1. Mild increased bibasilar opacities may represent atelectasis or early infiltrate. Recommend clinical correlation and follow-up to resolution. 2. No pneumothorax. 3. Coarsened interstitium, unchanged. Electronically Signed   By: Gerome Sam III M.D   On: 06/12/2016 07:16    Cardiac Studies  Echo personally reviewed EF 60-65%  Myovue :  Personally reviewed MPI images normal EF 76 %   Patient Profile     66 y.o. male admitted with syncope Life long "history " of arrhythmia per patient.  Nuclear/Echo With no evidence of ischemic heart disease or structural heart disease. Telemetry with 8 second pause On admission with multiple signs of sinus node dysfunction   Assessment & Plan    1) Syncope:  Have asked EP to see regarding pacer. Has 8 second pause and multiple 2-4 second pauses with PAF And risk of post termination pauses. Patient wants to go home EP to discuss and try to place pacer or arrange as outpatient  Spent over 35 minutes with at least 50% direct patient care discussing syncope and pacer Rx with patient  Charlton Haws   Signed, Charlton Haws, MD  06/13/2016, 11:16 AM

## 2016-06-14 ENCOUNTER — Ambulatory Visit (HOSPITAL_COMMUNITY): Admit: 2016-06-14 | Payer: Self-pay | Admitting: Internal Medicine

## 2016-06-14 ENCOUNTER — Encounter (HOSPITAL_COMMUNITY): Admission: EM | Disposition: A | Discharge status: Home / Self Care | Payer: Self-pay | Source: Home / Self Care

## 2016-06-14 ENCOUNTER — Encounter (HOSPITAL_COMMUNITY): Payer: Self-pay | Admitting: Internal Medicine

## 2016-06-14 HISTORY — PX: PACEMAKER IMPLANT: EP1218

## 2016-06-14 LAB — CBC
HCT: 38 % — ABNORMAL LOW (ref 39.0–52.0)
Hemoglobin: 12.5 g/dL — ABNORMAL LOW (ref 13.0–17.0)
MCH: 33.4 pg (ref 26.0–34.0)
MCHC: 32.9 g/dL (ref 30.0–36.0)
MCV: 101.6 fL — AB (ref 78.0–100.0)
Platelets: 138 10*3/uL — ABNORMAL LOW (ref 150–400)
RBC: 3.74 MIL/uL — ABNORMAL LOW (ref 4.22–5.81)
RDW: 13.5 % (ref 11.5–15.5)
WBC: 3.7 10*3/uL — AB (ref 4.0–10.5)

## 2016-06-14 LAB — CREATININE, SERUM
CREATININE: 0.8 mg/dL (ref 0.61–1.24)
GFR calc non Af Amer: >60 mL/min (ref >60)

## 2016-06-14 LAB — GLUCOSE, CAPILLARY: Glucose-Capillary: 96 mg/dL (ref 65–99)

## 2016-06-14 SURGERY — PACEMAKER IMPLANT
Anesthesia: LOCAL

## 2016-06-14 MED ORDER — LIDOCAINE HCL (PF) 1 % IJ SOLN
INTRAMUSCULAR | Status: AC
  Filled 2016-06-14: qty 60

## 2016-06-14 MED ORDER — FENTANYL CITRATE (PF) 100 MCG/2ML IJ SOLN
INTRAMUSCULAR | Status: AC
  Filled 2016-06-14: qty 2

## 2016-06-14 MED ORDER — ONDANSETRON HCL 4 MG/2ML IJ SOLN: 4.0000 mg | Freq: Four times a day (QID) | INTRAMUSCULAR | Status: DC | PRN

## 2016-06-14 MED ORDER — MIDAZOLAM HCL 5 MG/5ML IJ SOLN
INTRAMUSCULAR | Status: DC | PRN
  Administered 2016-06-14: 2 mg via INTRAVENOUS

## 2016-06-14 MED ORDER — CEFAZOLIN IN D5W 1 GM/50ML IV SOLN
1.0000 g | Freq: Four times a day (QID) | INTRAVENOUS | Status: AC
  Administered 2016-06-14 – 2016-06-15 (×3): 1 g via INTRAVENOUS
  Filled 2016-06-14 (×3): qty 50

## 2016-06-14 MED ORDER — CEFAZOLIN SODIUM-DEXTROSE 2-4 GM/100ML-% IV SOLN
INTRAVENOUS | Status: AC
  Filled 2016-06-14: qty 100

## 2016-06-14 MED ORDER — FENTANYL CITRATE (PF) 100 MCG/2ML IJ SOLN
INTRAMUSCULAR | Status: DC | PRN
  Administered 2016-06-14: 25 ug via INTRAVENOUS

## 2016-06-14 MED ORDER — HEPARIN (PORCINE) IN NACL 2-0.9 UNIT/ML-% IJ SOLN
INTRAMUSCULAR | Status: DC | PRN
  Administered 2016-06-14: 1000 mL

## 2016-06-14 MED ORDER — HEPARIN (PORCINE) IN NACL 2-0.9 UNIT/ML-% IJ SOLN
INTRAMUSCULAR | Status: AC
  Filled 2016-06-14: qty 1000

## 2016-06-14 MED ORDER — ACETAMINOPHEN 325 MG PO TABS: 325.0000 mg | ORAL_TABLET | ORAL | Status: DC | PRN

## 2016-06-14 MED ORDER — SODIUM CHLORIDE 0.9 % IR SOLN
Status: AC
  Filled 2016-06-14: qty 2

## 2016-06-14 MED ORDER — LIDOCAINE HCL (PF) 1 % IJ SOLN
INTRAMUSCULAR | Status: DC | PRN
  Administered 2016-06-14: 50 mL

## 2016-06-14 MED ORDER — HEPARIN SODIUM (PORCINE) 5000 UNIT/ML IJ SOLN
5000.0000 [IU] | Freq: Three times a day (TID) | INTRAMUSCULAR | Status: DC
  Administered 2016-06-14 – 2016-06-15 (×2): 5000 [IU] via SUBCUTANEOUS
  Filled 2016-06-14 (×2): qty 1

## 2016-06-14 MED ORDER — MIDAZOLAM HCL 5 MG/5ML IJ SOLN
INTRAMUSCULAR | Status: AC
  Filled 2016-06-14: qty 5

## 2016-06-14 SURGICAL SUPPLY — 12 items
CABLE SURGICAL S-101-97-12 (CABLE) ×2 IMPLANT
CATH RIGHTSITE C315HIS02 (CATHETERS) ×1 IMPLANT
IPG PACE AZUR XT DR MRI W1DR01 (Pacemaker) IMPLANT
LEAD CAPSURE NOVUS 5076-52CM (Lead) ×1 IMPLANT
LEAD SELECT SECURE 3830 383069 (Lead) IMPLANT
PACE AZURE XT DR MRI W1DR01 (Pacemaker) ×2 IMPLANT
PAD DEFIB LIFELINK (PAD) ×1 IMPLANT
SELECT SECURE 3830 383069 (Lead) ×2 IMPLANT
SHEATH CLASSIC 7F (SHEATH) ×2 IMPLANT
SLITTER 6232ADJ (MISCELLANEOUS) ×1 IMPLANT
TRAY PACEMAKER INSERTION (PACKS) ×1 IMPLANT
WIRE HI TORQ VERSACORE-J 145CM (WIRE) ×1 IMPLANT

## 2016-06-14 NOTE — Progress Notes (Signed)
Trauma Service Note  Subjective: Plan for pacemaker today. He wants to go home after but will stay if recommended. Lives with 66yo mother who has alzheimers. No other issues. Pain well controlled.   Objective: Vital signs in last 24 hours: Temp:  [97.5 F (36.4 C)-98.7 F (37.1 C)] 98.7 F (37.1 C) (04/10 0753) Pulse Rate:  [69-94] 69 (04/10 0753) Resp:  [18-23] 18 (04/10 0753) BP: (124-148)/(83-98) 148/93 (04/10 0753) SpO2:  [88 %-95 %] 94 % (04/10 0836) Weight:  [77.9 kg (171 lb 11.8 oz)] 77.9 kg (171 lb 11.8 oz) (04/10 0500) Last BM Date:  (PTA)  Intake/Output from previous day: 04/09 0701 - 04/10 0700 In: 720 [P.O.:720] Out: -  Intake/Output this shift: No intake/output data recorded.  General: No acute distress  Lungs: Clear and equal  Abd: Soft, benign  Extremities: No changes  Neuro: Intact  Lab Results: CBC   Recent Labs  06/12/16 0218  WBC 5.9  HGB 12.2*  HCT 37.0*  PLT 136*   BMET  Recent Labs  06/12/16 0218  NA 135  K 3.9  CL 102  CO2 26  GLUCOSE 92  BUN 11  CREATININE 0.90  CALCIUM 8.0*   PT/INR No results for input(s): LABPROT, INR in the last 72 hours. ABG No results for input(s): PHART, HCO3 in the last 72 hours.  Invalid input(s): PCO2, PO2  Studies/Results: No results found.  Anti-infectives: Anti-infectives    Start     Dose/Rate Route Frequency Ordered Stop   06/14/16 1130  ceFAZolin (ANCEF) IVPB 2g/100 mL premix     2 g 200 mL/hr over 30 Minutes Intravenous To [facility] 06/13/16 1702 06/15/16 1130   06/14/16 1130  gentamicin (GARAMYCIN) 80 mg in sodium chloride irrigation 0.9 % 500 mL irrigation     80 mg Irrigation To [facility] 06/13/16 1702 06/15/16 1130   06/09/16 1545  amoxicillin-clavulanate (AUGMENTIN) 875-125 MG per tablet 1 tablet     1 tablet Oral Every 12 hours 06/09/16 1539        Assessment/Plan: -Pacer today -Dispo per cardiology recommendations post- procedure -Counseled on  importance of incentive spirometry  LOS: 5 days   Berna Bue MD 06/14/2016

## 2016-06-14 NOTE — Progress Notes (Signed)
SUBJECTIVE: The patient is doing well today.  At this time, he denies chest pain, shortness of breath, or any new concerns.  CURRENT MEDICATIONS: . amoxicillin-clavulanate  1 tablet Oral Q12H  .  ceFAZolin (ANCEF) IV  2 g Intravenous To SS-Surg  . diltiazem  120 mg Oral Daily  . gentamicin irrigation  80 mg Irrigation To SS-Surg  . ibuprofen  600 mg Oral TID  . methocarbamol  750 mg Oral TID  . mometasone-formoterol  2 puff Inhalation BID  . nicotine  21 mg Transdermal Daily  . pantoprazole  40 mg Oral Daily  . senna-docusate  1 tablet Oral BID  . sodium chloride flush  3 mL Intravenous Q12H  . tiotropium  18 mcg Inhalation Daily   . sodium chloride 50 mL/hr at 06/14/16 0603  . dextrose 5 % and 0.45 % NaCl with KCl 20 mEq/L Stopped (06/13/16 0600)    OBJECTIVE: Physical Exam: Vitals:   06/14/16 0300 06/14/16 0500 06/14/16 0753 06/14/16 0836  BP:   (!) 148/93   Pulse:   69   Resp:   18   Temp: 98.3 F (36.8 C)  98.7 F (37.1 C)   TempSrc: Oral  Oral   SpO2:   95% 94%  Weight:  171 lb 11.8 oz (77.9 kg)    Height:        Intake/Output Summary (Last 24 hours) at 06/14/16 1009 Last data filed at 06/13/16 2100  Gross per 24 hour  Intake              720 ml  Output                0 ml  Net              720 ml    Telemetry reveals sinus rhythm (personally reviewed)  GEN- The patient is chronically ill appearing, alert and oriented x 3 today.   Head- normocephalic, atraumatic Eyes-  Sclera clear, conjunctiva pink Ears- hearing intact Oropharynx- clear, poor dentition Neck- supple  Lungs- Clear to ausculation bilaterally, normal work of breathing Heart- Regular rate and rhythm  GI- soft, NT, ND, + BS Extremities- no clubbing, cyanosis, or edema Skin- no rash or lesion Psych- euthymic mood, full affect Neuro- strength and sensation are intact  LABS: Basic Metabolic Panel:  Recent Labs  16/10/96 0218  NA 135  K 3.9  CL 102  CO2 26  GLUCOSE 92  BUN 11    CREATININE 0.90  CALCIUM 8.0*   CBC:  Recent Labs  06/12/16 0218  WBC 5.9  NEUTROABS 4.0  HGB 12.2*  HCT 37.0*  MCV 97.9  PLT 136*   ASSESSMENT AND PLAN:  Principal Problem:   Syncope Active Problems:   Multiple fractures of ribs, right side, initial encounter for closed fracture   Sinusitis   Pulmonary nodule   Tobacco use   Abnormal CT of the chest   Dental caries   PAF (paroxysmal atrial fibrillation) (HCC)   Peripheral vascular obstructive disease (HCC)  1.  Syncope/sinus node dysfunction The patient presents with syncope and has had sinus node dysfunction with pauses of up to 8 seconds documented on telemetry while here.  He is on no AVN blocking agents.  Myoview without ischemia, echo normal.  I do not think pauses are vagally mediated because there is no P-P or R-R prolongation prior to pause. He also has multiple other sinus pauses of up to 4 seconds with junctional escape beats.  Plan for PPM implant with Dr [name] later today Risks, benefits again reviewed with patient who wishes to proceed.   2.  Newly diagnosed atrial fibrillation V rates are elevated but unable to use rate control until back up brady pacing in place CHADS2VASC is 1 - will follow for now  Gypsy Balsam, NP 06/14/2016 10:10 AM  EP Attending  Patient seen and examined. Agree with above. The patient is stable for PPM insertion. I have discussed the indications, risks/benefits/goals/expectations and he wishes to proceed.  Leonia Reeves.D.

## 2016-06-14 NOTE — Progress Notes (Signed)
PT Cancellation Note  Patient Details Name: Patrick Kelly MRN: [id number] DOB: 1950/11/12   Cancelled Treatment:    Reason Eval/Treat Not Completed: Patient at procedure or test/unavailable   Fabio Asa 06/14/2016, 1:43 PM Charlotte Crumb, PT DPT  [phone]

## 2016-06-14 NOTE — Discharge Instructions (Signed)
° ° °  Supplemental Discharge Instructions for  Pacemaker/Defibrillator Patients  Activity No heavy lifting or vigorous activity with your left/right arm for 6 to 8 weeks.  Do not raise your left/right arm above your head for one week.  Gradually raise your affected arm as drawn below.           __        06/18/16                       06/19/16                      06/20/16                06/21/16   NO DRIVING until after wound check appointment   WOUND CARE - Keep the wound area clean and dry.  Do not get this area wet for one week. No showers for one week; you may shower on  06/21/16   . - The tape/steri-strips on your wound will fall off; do not pull them off.  No bandage is needed on the site.  DO  NOT apply any creams, oils, or ointments to the wound area. - If you notice any drainage or discharge from the wound, any swelling or bruising at the site, or you develop a fever > 101? F after you are discharged home, call the office at once.  Special Instructions - You are still able to use cellular telephones; use the ear opposite the side where you have your pacemaker/defibrillator.  Avoid carrying your cellular phone near your device. - When traveling through airports, show security personnel your identification card to avoid being screened in the metal detectors.  Ask the security personnel to use the hand wand. - Avoid arc welding equipment, MRI testing (magnetic resonance imaging), TENS units (transcutaneous nerve stimulators).  Call the office for questions about other devices. - Avoid electrical appliances that are in poor condition or are not properly grounded. - Microwave ovens are safe to be near or to operate.

## 2016-06-14 NOTE — Progress Notes (Signed)
Orthopedic Tech Progress Note Patient Details:  Patrick Kelly Sep 13, 1950 [id number] Patient has sling Patient ID: [name], male   DOB: December 14, 1950, 66 y.o.   MRN: [id number]   [name] 06/14/2016, 9:06 PM

## 2016-06-15 ENCOUNTER — Inpatient Hospital Stay (HOSPITAL_COMMUNITY): Payer: Medicaid Other

## 2016-06-15 LAB — GLUCOSE, CAPILLARY: GLUCOSE-CAPILLARY: 103 mg/dL — AB (ref 65–99)

## 2016-06-15 MED ORDER — AMOXICILLIN-POT CLAVULANATE 875-125 MG PO TABS: 1.0000 | ORAL_TABLET | Freq: Two times a day (BID) | ORAL | 0 refills | Status: DC

## 2016-06-15 MED ORDER — METHOCARBAMOL 750 MG PO TABS: 750.0000 mg | ORAL_TABLET | Freq: Three times a day (TID) | ORAL | 0 refills | Status: DC | PRN

## 2016-06-15 MED ORDER — IBUPROFEN 600 MG PO TABS: 600.0000 mg | ORAL_TABLET | Freq: Three times a day (TID) | ORAL | 0 refills | Status: DC | PRN

## 2016-06-15 NOTE — Progress Notes (Signed)
Delayed DC d/t awaiting CM. VSS.  DC instructions given to patient and brother in law at bedside. RN stressed importance of new medication regimen and to finish abx and take as prescribed. Thoroughly reiterated PPM restrictions. Educated on follow up appointments. PIV DC, hemostasis achieved. eICU and CCMD notified of discharge. All belongings sent home with patient. Pt escorted by NT via wheelchair  to private vehicle driven by family.

## 2016-06-15 NOTE — Progress Notes (Addendum)
Physical Therapy Treatment Patient Details Name: Patrick Kelly MRN: [id number] DOB: 1950-06-08 Today's Date: 06/15/2016    History of Present Illness Pt is a 66 y/o male admitted after sustaining a fall down a flight of stairs secondary to a syncopal episode. +LOC. Pt with R 6-10 rib fxs. PMH including but not limited to tobacco use.    PT Comments    Patient mobilizing well today, ambulate din hall, performed stair negotiation and was receptive to education re:UE restrictions. Anticipate patient will be safe for d/c home.   Follow Up Recommendations  No PT follow up;Supervision/Assistance - 24 hour     Equipment Recommendations  None recommended by PT;Other (comment) (defer to next venue)    Recommendations for Other Services       Precautions / Restrictions Precautions Precautions: Fall Precaution Comments: R rib fxs Restrictions Weight Bearing Restrictions: No LUE Weight Bearing: Non weight bearing    Mobility  Bed Mobility Overal bed mobility: Modified Independent                Transfers Overall transfer level: Modified independent Equipment used: None                Ambulation/Gait Ambulation/Gait assistance: Modified independent (Device/Increase time) Ambulation Distance (Feet): 260 Feet Assistive device: None Gait Pattern/deviations: WFL(Within Functional Limits)         Stairs Stairs: Yes   Stair Management: One rail Right Number of Stairs: 12 General stair comments: educated on safety with use of rail  Wheelchair Mobility    Modified Rankin (Stroke Patients Only)       Balance Overall balance assessment: Needs assistance Sitting-balance support: Feet supported Sitting balance-Leahy Scale: Good       Standing balance-Leahy Scale: Fair                              Cognition Arousal/Alertness: Awake/alert Behavior During Therapy: WFL for tasks assessed/performed Overall Cognitive Status: Within Functional  Limits for tasks assessed                                        Exercises      General Comments        Pertinent Vitals/Pain Faces Pain Scale: Hurts little more Pain Location: R ribs Pain Descriptors / Indicators: Sore;Grimacing;Guarding    Home Living                      Prior Function            PT Goals (current goals can now be found in the care plan section) Acute Rehab PT Goals Patient Stated Goal: return to living alone PT Goal Formulation: With patient Time For Goal Achievement: 06/24/16 Potential to Achieve Goals: Good Progress towards PT goals: Progressing toward goals    Frequency    Min 3X/week      PT Plan Current plan remains appropriate    Co-evaluation             End of Session   Activity Tolerance: Patient tolerated treatment well Patient left: in chair;with call bell/phone within reach Nurse Communication: Mobility status PT Visit Diagnosis: Other abnormalities of gait and mobility (R26.89);History of falling (Z91.81);Pain Pain - Right/Left: Right Pain - part of body:  (ribs)     Time: 4098-1191 PT Time Calculation (min) (ACUTE ONLY):  17 min  Charges:  $Gait Training: 8-22 mins                    G Codes:       Charlotte Crumb, [place] DPT  [phone]    Fabio Asa 06/15/2016, 5:30 PM

## 2016-06-15 NOTE — Plan of Care (Signed)
Problem: Education: Goal: Knowledge of [facility] Education information/materials will improve Outcome: Adequate for Discharge Pt educated on PPM restrictions. Showering. Follow up appointments and medication regimens. Pt aware of limitations.   Problem: Safety: Goal: Ability to remain free from injury will improve Outcome: Adequate for Discharge Pt educated to continue to utilize IS and deep breathing exercises.

## 2016-06-15 NOTE — Progress Notes (Signed)
Trauma Service Note  Subjective: Patient sitting up in chair and looks great.  Pacemanker in place  Objective: Vital signs in last 24 hours: Temp:  [97.4 F (36.3 C)-98.5 F (36.9 C)] 97.4 F (36.3 C) (04/11 0754) Pulse Rate:  [0-162] 81 (04/11 0822) Resp:  [0-36] 21 (04/11 0822) BP: (124-159)/(83-108) 124/84 (04/11 0754) SpO2:  [0 %-99 %] 92 % (04/11 0822) Weight:  [77 kg (169 lb 12.1 oz)] 77 kg (169 lb 12.1 oz) (04/11 0500) Last BM Date: 06/15/16  Intake/Output from previous day: 04/10 0701 - 04/11 0700 In: 1217.5 [P.O.:490; I.V.:627.5; IV Piggyback:100] Out: 1375 [Urine:1375] Intake/Output this shift: Total I/O In: 240 [P.O.:240] Out: -   General: No acute distress  Lungs: Clear  Abd: Benign  Extremities: No changes  Neuro: Intact  Lab Results: CBC   Recent Labs  06/14/16 1456  WBC 3.7*  HGB 12.5*  HCT 38.0*  PLT 138*   BMET  Recent Labs  06/14/16 1456  CREATININE 0.80   PT/INR No results for input(s): LABPROT, INR in the last 72 hours. ABG No results for input(s): PHART, HCO3 in the last 72 hours.  Invalid input(s): PCO2, PO2  Studies/Results: Dg Chest 2 View  Result Date: 06/15/2016 CLINICAL DATA:  Status post pacemaker placement for cardiac arrhythmia EXAM: CHEST  2 VIEW COMPARISON:  June 12, 2016 FINDINGS: There is now a pacemaker present with lead tips attached to the right atrium and right ventricle. No pneumothorax. There is interstitial edema with small pleural effusions bilaterally. Heart is upper normal in size with mild pulmonary venous hypertension. No evident adenopathy. No bone lesions are appreciable. IMPRESSION: Pacemaker lead tips attached to right atrium and right ventricle. Evidence of a degree of congestive heart failure. No appreciable airspace consolidation. Electronically Signed   By: Bretta Bang III M.D.   On: 06/15/2016 07:20    Anti-infectives: Anti-infectives    Start     Dose/Rate Route Frequency Ordered Stop    06/14/16 1830  ceFAZolin (ANCEF) IVPB 1 g/50 mL premix     1 g 100 mL/hr over 30 Minutes Intravenous Every 6 hours 06/14/16 1414 06/15/16 0559   06/14/16 1130  ceFAZolin (ANCEF) IVPB 2g/100 mL premix     2 g 200 mL/hr over 30 Minutes Intravenous To [facility] 06/13/16 1702 06/14/16 1300   06/14/16 1130  gentamicin (GARAMYCIN) 80 mg in sodium chloride irrigation 0.9 % 500 mL irrigation     80 mg Irrigation To [facility]Stay [facility] 06/13/16 1702 06/14/16 1320   06/09/16 1545  amoxicillin-clavulanate (AUGMENTIN) 875-125 MG per tablet 1 tablet     1 tablet Oral Every 12 hours 06/09/16 1539        Assessment/Plan: s/p Procedure(s): Pacemaker Implant-Dual chamber Discharge  LOS: 6 days   Patrick Kelly. Gae Bon, MD, FACS [phone] Trauma Surgeon 06/15/2016

## 2016-06-15 NOTE — Progress Notes (Signed)
    SUBJECTIVE: The patient is doing well today.  At this time, he denies chest pain, shortness of breath, or any new concerns.  CURRENT MEDICATIONS: . amoxicillin-clavulanate  1 tablet Oral Q12H  . diltiazem  120 mg Oral Daily  . heparin subcutaneous  5,000 Units Subcutaneous Q8H  . ibuprofen  600 mg Oral TID  . methocarbamol  750 mg Oral TID  . mometasone-formoterol  2 puff Inhalation BID  . nicotine  21 mg Transdermal Daily  . pantoprazole  40 mg Oral Daily  . senna-docusate  1 tablet Oral BID  . sodium chloride flush  3 mL Intravenous Q12H  . tiotropium  18 mcg Inhalation Daily   . dextrose 5 % and 0.45 % NaCl with KCl 20 mEq/L 50 mL/hr at 06/14/16 1927    OBJECTIVE: Physical Exam: Vitals:   06/15/16 0246 06/15/16 0500 06/15/16 0754 06/15/16 0822  BP: (!) 136/99  124/84   Pulse: 75  76 81  Resp: 15  20 (!) 21  Temp: 98.5 F (36.9 C)  97.4 F (36.3 C)   TempSrc: Oral  Oral   SpO2: 90%  92% 92%  Weight:  169 lb 12.1 oz (77 kg)    Height:        Intake/Output Summary (Last 24 hours) at 06/15/16 1610 Last data filed at 06/15/16 0000  Gross per 24 hour  Intake           1217.5 ml  Output             1375 ml  Net           -157.5 ml    Telemetry reveals sinus rhythm (personally reviewed)  GEN- The patient is chronically ill appearing, alert and oriented x 3 today.   Head- normocephalic, atraumatic Eyes-  Sclera clear, conjunctiva pink Ears- hearing intact Oropharynx- clear, poor dentition Neck- supple  Lungs- Clear to ausculation bilaterally, normal work of breathing Heart- Regular rate and rhythm  GI- soft, NT, ND, + BS Extremities- no clubbing, cyanosis, or edema Skin- no rash or lesion Psych- euthymic mood, full affect Neuro- strength and sensation are intact  LABS: Basic Metabolic Panel:  Recent Labs  96/04/54 1456  CREATININE 0.80   CBC:  Recent Labs  06/14/16 1456  WBC 3.7*  HGB 12.5*  HCT 38.0*  MCV 101.6*  PLT 138*   ASSESSMENT AND  PLAN:  Principal Problem:   Syncope Active Problems:   Multiple fractures of ribs, right side, initial encounter for closed fracture   Sinusitis   Pulmonary nodule   Tobacco use   Abnormal CT of the chest   Dental caries   PAF (paroxysmal atrial fibrillation) (HCC)   Peripheral vascular obstructive disease (HCC)  1.  Syncope/sinus node dysfunction Doing well post pacemaker Device interrogation reviewed and normal CXR without ptx Usual follow up  2.  Newly diagnosed atrial fibrillation CHADS2VASC is 1 - will follow for now  [facility] from EP standpoint to discharge home.  Follow up and instructions entered.   Gypsy Balsam, NP 06/15/2016 9:09 AM   EP Attending  Patient seen and examined. CXR reviewed. Device interogation performed under my supervision. His DDD PPM is working normally. Stable for DC home from my perspective. No indication for systemic anti-coag from my perspective. Will arrange device followup.  Leonia Reeves.D.

## 2016-06-15 NOTE — Care Management Note (Signed)
Case Management Note  Patient Details  Name: Patrick Kelly MRN: [id number] Date of Birth: 02/11/1951  Subjective/Objective:      Pt is a 66 y/o male admitted after sustaining a fall down a flight of stairs secondary to a syncopal episode.  Pt with LOC and Rt 6-10 rib fx.  PTA, pt resides at home alone.                Action/Plan: Met with pt and family member at bedside.  CM referral for PCP and dental referral.  Pt uninsured, but is 65 and does qualify for Medicare.  He states he has not applied, but plans to.  Encouraged pt to do this; gave website for online application and directions to social security office in [place] to apply in person.  Pt given list of indigent dental clinics in [place] and surrounding area.  He is agreeable to follow up at [facility] for PCP; clinic information put on AVS in EPIC.  Will make follow up appointment for pt if available.  Pt states he plans to stay with family members at [place], likely mother and brother, though PT recommending SNF at dc.  Will consult CSW to follow for possible SNF.   Will follow progress..  Expected Discharge Date:  06/15/16               Expected Discharge Plan:  Home/Self Care  In-House Referral:     Discharge planning Services  CM Consult, [facility], [facility] Program  Post Acute Care Choice:    Choice offered to:     DME Arranged:    DME Agency:     HH Arranged:    HH Agency:     Status of Service:  Completed, signed off  If discussed at [employer], dates discussed:    Additional Comments:  06/15/16 J. Montzerrat Brunell, RN, BSN  Pt medically stable for discharge home today.  PT/OT recommending no OP follow up.  Brother in Sports coach at bedside; family will assist at dc.  Pt uninsured; pt given MATCH letter to assist with discharge medications.  Attempted to make follow up appt at [facility] for patient, but they have no appts available.  Schedule to open back up on Friday.  Instructed pt to  call [facility] on Friday to schedule appt for hospital follow up.  Pt/family member verbalized understanding of instructions.    Reinaldo Raddle, RN, BSN  Trauma/Neuro ICU Case Manager [phone]

## 2016-06-21 ENCOUNTER — Ambulatory Visit: Payer: Medicaid Other | Attending: Internal Medicine | Admitting: Physician Assistant

## 2016-06-21 VITALS — BP 121/73 | HR 85 | Temp 98.1°F | Resp 16 | Ht 70.0 in | Wt 170.2 lb

## 2016-06-21 DIAGNOSIS — Z950 Presence of cardiac pacemaker: Secondary | ICD-10-CM | POA: Diagnosis not present

## 2016-06-21 DIAGNOSIS — R55 Syncope and collapse: Secondary | ICD-10-CM

## 2016-06-21 DIAGNOSIS — R911 Solitary pulmonary nodule: Secondary | ICD-10-CM

## 2016-06-21 DIAGNOSIS — R9389 Abnormal findings on diagnostic imaging of other specified body structures: Secondary | ICD-10-CM

## 2016-06-21 DIAGNOSIS — R938 Abnormal findings on diagnostic imaging of other specified body structures: Secondary | ICD-10-CM | POA: Diagnosis not present

## 2016-06-21 DIAGNOSIS — Z79899 Other long term (current) drug therapy: Secondary | ICD-10-CM | POA: Insufficient documentation

## 2016-06-21 DIAGNOSIS — W109XXA Fall (on) (from) unspecified stairs and steps, initial encounter: Secondary | ICD-10-CM | POA: Diagnosis not present

## 2016-06-21 DIAGNOSIS — F17210 Nicotine dependence, cigarettes, uncomplicated: Secondary | ICD-10-CM | POA: Insufficient documentation

## 2016-06-21 DIAGNOSIS — S2241XA Multiple fractures of ribs, right side, initial encounter for closed fracture: Secondary | ICD-10-CM | POA: Diagnosis not present

## 2016-06-21 NOTE — Progress Notes (Signed)
Patient ID: Patrick Kelly, male   DOB: September 14, 1950, 66 y.o.   MRN: [id number]     Jenesis Suchy, is a 66 y.o. male  WUJ:[id number]  NFA:[id number]  DOB - 05-05-1950  Subjective:  Chief Complaint and HPI: Patrick Kelly is a 66 y.o. male here today to establish care and for a follow up visit After He was taken to the ED via EMS on 06/09/2016 and admitted into the hospital after falling down 13 concrete stairs and breaking ribs 6-10 on his R side.  During his hospitalization, he was found to have symptomatic sinus bradycardia with sinus node dysfunction which resulted in him having a dual-chamber pacemaker placed on 06/14/2016.  CT head, C-spine, and abdomen were benign.  A pulmonary nodule was found and it is recommended for f/up in 6-12 months.  He was discharged on 06/15/2016. .   Today he is having mild pain in the area where his ribs are fractured.  Ibuprofen helps.  He denies SOB or cough.  He is doing well since the pacemaker placement and is scheduled to see cardiology 06/27/2016.  He denies CP/further syncopal episodes/dizziness or palpitations. He has smoked about 3 cigarettes since leaving the hospital.   ED/Hospital notes reviewed.  Summarized as above  No labs that need f/up today Social History:  Employed, lives with mom   ROS:   Constitutional:  No f/c, No night sweats, No unexplained weight loss. EENT:  No vision changes, No blurry vision, No hearing changes. No mouth, throat, or ear problems.  Respiratory: No cough, No SOB Cardiac: No CP, no palpitations GI:  No abd pain, No N/V/D. GU: No Urinary s/sx Musculoskeletal: +rib pain Neuro: No headache, no dizziness, no motor weakness.  Skin: No rash Endocrine:  No polydipsia. No polyuria.  Psych: Denies SI/HI  No problems updated.  ALLERGIES: No Known Allergies  PAST MEDICAL HISTORY: Past Medical History:  Diagnosis Date  . Abnormal CT of the chest    emphysemetous changes suggestive copd  . Dental caries   .  Multiple fractures of ribs   . Pulmonary nodule   . Sinusitis   . Syncope   . Tobacco use     MEDICATIONS AT HOME: Prior to Admission medications   Medication Sig Start Date End Date Taking? Authorizing Provider  amoxicillin-clavulanate (AUGMENTIN) 875-125 MG tablet Take 1 tablet by mouth every 12 (twelve) hours. 06/15/16  Yes Edson Snowball, PA-C  budesonide-formoterol (SYMBICORT) 160-4.5 MCG/ACT inhaler Inhale 2 puffs into the lungs 2 (two) times daily. 06/12/16  Yes Rodolph Bong, MD  ibuprofen (ADVIL,MOTRIN) 600 MG tablet Take 1 tablet (600 mg total) by mouth 3 (three) times daily as needed. 06/15/16  Yes Edson Snowball, PA-C  pantoprazole (PROTONIX) 40 MG tablet Take 1 tablet (40 mg total) by mouth daily. 06/13/16  Yes Rodolph Bong, MD  tiotropium (SPIRIVA) 18 MCG inhalation capsule Place 1 capsule (18 mcg total) into inhaler and inhale daily. 06/13/16  Yes Rodolph Bong, MD  methocarbamol (ROBAXIN) 750 MG tablet Take 1 tablet (750 mg total) by mouth 3 (three) times daily as needed for muscle spasms. Patient not taking: Reported on 06/21/2016 06/15/16   Edson Snowball, PA-C     Objective:  EXAM:   Vitals:   06/21/16 1553  BP: 121/73  Pulse: 85  Resp: 16  Temp: 98.1 F (36.7 C)  TempSrc: Oral  SpO2: 95%  Weight: 170 lb 3.2 oz (77.2 kg)  Height:  (1.778 m)  General appearance : A&OX3. NAD. Non-toxic-appearing HEENT: Atraumatic and Normocephalic.  PERRLA. EOM intact.  Neck: supple, no JVD. No cervical lymphadenopathy. No thyromegaly Chest/Lungs:  Breathing-non-labored, Good air entry bilaterally, breath sounds coarse without rales, rhonchi, or wheezing  CVS: S1 S2 regular, no murmurs, gallops, rubs Chest: incision site for pacemaker warm dry and intact with steristrips in place.  No surrounding erythema.  No swelling or induration Extremities: Bilateral Lower Ext shows no edema, both legs are warm to touch with = pulse throughout Neurology:  CN II-XII  grossly intact, Non focal.   Psych:  TP linear. J/I WNL. Normal speech. Appropriate eye contact and affect.  Skin:  No Rash  Data Review Lab Results  Component Value Date   HGBA1C 5.0 06/11/2016     Assessment & Plan  1. Syncope, unspecified syncope type Resolved s/p pacemaker.  Keep appt with cardiology for 4/23  2. Multiple fractures of ribs, right side, initial encounter for closed fracture Continue ibuprofen with food prn pain.  Deep breathing exercises for improved lung functiona dn healing and to reduce risk of complications.  3. Pulmonary nodule Smoking cessation imperative - CT CHEST NODULE FOLLOW UP LOW DOSE W/O; Future  4. Abnormal CT of the chest Do not smoke!! - CT CHEST NODULE FOLLOW UP LOW DOSE W/O; Future     Patient have been counseled extensively about nutrition and exercise  Return in about 3 weeks (around 07/12/2016) for assign PCP; f/up rib fractures.  The patient was given clear instructions to go to ER or return to medical center if symptoms don't improve, worsen or new problems develop. The patient verbalized understanding. The patient was told to call to get lab results if they haven't heard anything in the next week.     Georgian Co, PA-C [facility] [place], [place] [phone]   06/21/2016, 4:27 PM

## 2016-06-21 NOTE — Patient Instructions (Addendum)
   Specialty:  Cardiology 06/27/2016 Why:  at 12noon Contact information: [address], [address] [place] [address] [phone]       Lewayne Bunting, MD Follow up on 09/13/2016.   Specialty:  Cardiology Why:  at 8:45AM  Contact information: [address] [place] [address] [phone]

## 2016-06-21 NOTE — Discharge Summary (Signed)
[facility] Discharge Summary   Patient ID: Patrick Kelly MRN: [id number] DOB/AGE: Jan 26, 1951 66 y.o.  Admit date: 06/09/2016 Discharge date: 06/15/2016  Admitting Diagnosis: Fall Syncope Right sided rib fractures  Discharge Diagnosis Patient Active Problem List   Diagnosis Date Noted  . 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 PAF (paroxysmal atrial fibrillation) (HCC)   . Peripheral vascular obstructive disease (HCC)   . Multiple fractures of ribs, right side, initial encounter for closed fracture 06/09/2016  . Sinusitis 06/09/2016  . Pulmonary nodule 06/09/2016  . Syncope 06/09/2016  . Tobacco use   . Abnormal CT of the chest   . Dental caries   . Multiple closed fractures of ribs of right side   . Essential hypertension     Consultants Toya Smothers, NP, Internal Medicine Dr. Dietrich Pates, Cardiology Dr. Andree Coss [facility], Electrophysiology  Imaging: No results found.  Procedures Dr. Lewayne Bunting, 06/14/16, implantation of a Medtronic dual-chamber pacemaker for symptomatic bradycardia due to sinus node dysfunction  HPI: Patrick Kelly is a 66 year old male with no significant PMHx pack a day smoker who was brought to the ED via EMS following a possible syncopal episode resulting in a fall down the 13 concrete stairs of his apartment. His sister is present throughout the interview. The patient states that he fell after exiting his apartment but before he was able to lock the deadbolt on his door. He notes that he does not remember the fall and did suffer a LOC. He awoke at the bottom of the stairs roughly 1 hour later and proceeded to crawl half way up the stairs to his phone at which time his coworker called inquiring his whereabouts. The patient states that when he awoke he was covered in blood. He states that he was in extreme pain on the right side of his chest since he woke. Pain is 2/10 at rest and severe and worse with movement, non  radiating, sharp.   The patient states that he was having a normal morning, did not feel dizzy, weak, numbness, tingling, SOB, or notice a headache before the event. He did not eat breakfast, but states that this is not different from any other morning. He states that he does not have a history of falling and has been very healthy prior to the fall. He does not take any medications. Denies any cardiac history.  Hospital Course:   Workup showed right sided rib fractures. Medicine was consulted for syncopal workup. CT showed pulmonary nodule in the right lung and outpt f/u in 6-12 months was recommended.  Patent was admitted to the hospital and placed on telemetry. Internal medicine was consulted for syncopal workup. On 4/6 pt had a syncopal episode with O2 sats int he 70-80's and HR in the 30's and rapid response was called. Pt was found to have A. Fib and was given lopressor with conversion back to NSR. Cardiology was consulted. Syncopal workup revealed paroxsymal atrial fibrillation and notable sinus pauses on telemetry. Pt underwent nuclear stress test and ECHO. Pt continued to have long pauses on telemetry suggesting SA node dysfunction. It was recommended that the patient get a pacemaker. Pt received a pacemaker on 4/10. On 06/15/16 the patient was voiding well, tolerating diet, ambulating well, pain well controlled, vital signs stable, and felt stable for discharge home.  Patient will follow up with cardiology and our office as needed knows to call with questions or concerns.   Patient was discharged in good  condition.  The [place] Substance controlled database was reviewed prior to prescribing narcotic pain medication to this patient.  I did not take part in this patient's care. All of the information provided in this discharge summary was taken from the EMR.   Allergies as of 06/15/2016   No Known Allergies     Medication List    TAKE these medications   amoxicillin-clavulanate  875-125 MG tablet Commonly known as:  AUGMENTIN Take 1 tablet by mouth every 12 (twelve) hours.   budesonide-formoterol 160-4.5 MCG/ACT inhaler Commonly known as:  SYMBICORT Inhale 2 puffs into the lungs 2 (two) times daily.   ibuprofen 600 MG tablet Commonly known as:  ADVIL,MOTRIN Take 1 tablet (600 mg total) by mouth 3 (three) times daily as needed.   methocarbamol 750 MG tablet Commonly known as:  ROBAXIN Take 1 tablet (750 mg total) by mouth 3 (three) times daily as needed for muscle spasms.   pantoprazole 40 MG tablet Commonly known as:  PROTONIX Take 1 tablet (40 mg total) by mouth daily.   tiotropium 18 MCG inhalation capsule Commonly known as:  SPIRIVA Place 1 capsule (18 mcg total) into inhaler and inhale daily.        Follow-up Information    [facility] Follow up.   Why:  Please call on Friday for primary care physician appointment.   Contact information: [address] [place] [address] [phone]       [facility] [address] [facility] Follow up on 06/27/2016.   Specialty:  Cardiology Why:  at 12noon Contact information: [address], [address] [place] [address] [phone]       Lewayne Bunting, MD Follow up on 09/13/2016.   Specialty:  Cardiology Why:  at 8:45AM  Contact information: [address] [place] [address] [phone]           Signed: Joyce Copa [facility] 06/21/2016, 3:04 PM Pager: [phone] Consults: [phone] Mon-Fri 7:00 am-4:30 pm Sat-Sun 7:00 am-11:30 am

## 2016-06-22 DIAGNOSIS — Z950 Presence of cardiac pacemaker: Secondary | ICD-10-CM

## 2016-06-22 HISTORY — DX: Presence of cardiac pacemaker: Z95.0

## 2016-06-27 ENCOUNTER — Encounter: Payer: Self-pay | Admitting: Internal Medicine

## 2016-06-27 ENCOUNTER — Ambulatory Visit (INDEPENDENT_AMBULATORY_CARE_PROVIDER_SITE_OTHER): Payer: Medicaid Other | Admitting: *Deleted

## 2016-06-27 DIAGNOSIS — I495 Sick sinus syndrome: Secondary | ICD-10-CM

## 2016-06-27 LAB — CUP PACEART INCLINIC DEVICE CHECK
Brady Statistic AP VP Percent: 0 %
Brady Statistic AP VS Percent: 0.79 %
Brady Statistic AS VP Percent: 0.03 %
Brady Statistic AS VS Percent: 99.18 %
Date Time Interrogation Session: 20180423143609
Implantable Lead Model: 5076
Implantable Pulse Generator Implant Date: 20180410
Lead Channel Impedance Value: 304 Ohm
Lead Channel Impedance Value: 323 Ohm
Lead Channel Impedance Value: 494 Ohm
Lead Channel Pacing Threshold Amplitude: 0.75 V
Lead Channel Pacing Threshold Pulse Width: 0.4 ms
Lead Channel Sensing Intrinsic Amplitude: 11.125 mV
Lead Channel Sensing Intrinsic Amplitude: 4.25 mV
Lead Channel Sensing Intrinsic Amplitude: 7.625 mV
Lead Channel Setting Pacing Amplitude: 3.5 V
Lead Channel Setting Pacing Amplitude: 3.5 V
Lead Channel Setting Pacing Pulse Width: 1 ms
MDC IDC LEAD IMPLANT DT: 20180410
MDC IDC LEAD IMPLANT DT: 20180410
MDC IDC LEAD LOCATION: 753859
MDC IDC LEAD LOCATION: 753860
MDC IDC MSMT BATTERY REMAINING LONGEVITY: 184 mo
MDC IDC MSMT BATTERY VOLTAGE: 3.23 V
MDC IDC MSMT LEADCHNL RA PACING THRESHOLD AMPLITUDE: 0.875 V
MDC IDC MSMT LEADCHNL RA PACING THRESHOLD PULSEWIDTH: 0.4 ms
MDC IDC MSMT LEADCHNL RA SENSING INTR AMPL: 3.625 mV
MDC IDC MSMT LEADCHNL RV IMPEDANCE VALUE: 475 Ohm
MDC IDC SET LEADCHNL RV SENSING SENSITIVITY: 2 mV
MDC IDC STAT BRADY RA PERCENT PACED: 0.79 %
MDC IDC STAT BRADY RV PERCENT PACED: 0.04 %

## 2016-06-27 NOTE — Progress Notes (Signed)
Wound check appointment (HBP). Steri-strips removed. Wound without redness or edema. Incision edges approximated, wound well healed. Normal device function. Thresholds, sensing, and impedances consistent with implant measurements. RV His Bundle lead- no change on EKG from 5v down to 0.75v. Device programmed at 3.5V @ 0.44ms in RA and 3.5V @ 1ms in RV for extra safety margin until 3 month visit. Histogram distribution appropriate for patient and level of activity. 1.5% AT/AF- no OAC, reviewed with GT- will address at 6 week AS appt. 2 SVT episodes- AF RVR. 1 "VT" episode- brief conducted AT. Patient educated about wound care, arm mobility, lifting restrictions and Carelink monitoring. [address] monitor ordered and sent to patient's mother's home in [place]. ROV with AS 08/02/16 and GT 09/13/16.

## 2016-07-07 ENCOUNTER — Telehealth: Payer: Self-pay | Admitting: *Deleted

## 2016-07-07 NOTE — Telephone Encounter (Signed)
LM on "home" number listed about appt 07/21/16 at 12:40pm with Gypsy BalsamAmber Seiler, NP.

## 2016-07-07 NOTE — Telephone Encounter (Signed)
Increased AF burden noted via remote monitoring alert. Reviewed with GT- VO to move EP APP appt earlier. Rescheduled from 08/02/16 to 07/21/16 at 1240pm.   Home number is mother's home- he no longer lives there. Mobile number- no answer, no VM set up.  Will continue to call.

## 2016-07-12 ENCOUNTER — Encounter: Payer: Self-pay | Admitting: Family Medicine

## 2016-07-12 ENCOUNTER — Ambulatory Visit: Payer: Self-pay | Attending: Family Medicine | Admitting: Family Medicine

## 2016-07-12 VITALS — BP 123/79 | HR 79 | Temp 98.0°F | Resp 18 | Ht 69.0 in | Wt 168.4 lb

## 2016-07-12 DIAGNOSIS — S2241XA Multiple fractures of ribs, right side, initial encounter for closed fracture: Secondary | ICD-10-CM

## 2016-07-12 DIAGNOSIS — J438 Other emphysema: Secondary | ICD-10-CM | POA: Insufficient documentation

## 2016-07-12 DIAGNOSIS — Z950 Presence of cardiac pacemaker: Secondary | ICD-10-CM | POA: Insufficient documentation

## 2016-07-12 DIAGNOSIS — Z720 Tobacco use: Secondary | ICD-10-CM

## 2016-07-12 DIAGNOSIS — R911 Solitary pulmonary nodule: Secondary | ICD-10-CM | POA: Insufficient documentation

## 2016-07-12 DIAGNOSIS — J439 Emphysema, unspecified: Secondary | ICD-10-CM

## 2016-07-12 HISTORY — DX: Emphysema, unspecified: J43.9

## 2016-07-12 MED ORDER — TIOTROPIUM BROMIDE MONOHYDRATE 18 MCG IN CAPS: 18.0000 ug | ORAL_CAPSULE | Freq: Every day | RESPIRATORY_TRACT | 3 refills | Status: DC

## 2016-07-12 MED ORDER — BUDESONIDE-FORMOTEROL FUMARATE 160-4.5 MCG/ACT IN AERO: 2.0000 | INHALATION_SPRAY | Freq: Two times a day (BID) | RESPIRATORY_TRACT | 3 refills | Status: DC

## 2016-07-12 MED ORDER — ALBUTEROL SULFATE HFA 108 (90 BASE) MCG/ACT IN AERS: 2.0000 | INHALATION_SPRAY | Freq: Four times a day (QID) | RESPIRATORY_TRACT | 3 refills | Status: DC | PRN

## 2016-07-12 NOTE — Progress Notes (Signed)
Subjective:  Patient ID: Patrick Kelly, male    DOB: 06-11-1950  Age: 66 y.o. MRN: [id number]  CC: Establish Care   HPI Patrick MealyDaniel L Molzahn is a 66 year old male with a history of tobacco abuse, emphysema, sinus node dysfunction (status post Medtronic dual-chamber pacemaker implant) diagnosed after syncope and fall in which he sustained right rib fractures. CT chest at that time had revealed multiple right-sided rib fractures, an 6mm right pulmonary nodule with recommendations for 6-12 month follow-up. Also revealed emphysema.  He presents today stating he is doing well overall and denies shortness of breath or chest pains. He does not have rib pains at this time and rarely has to use any medications for pain. He had his pacemaker checked on 07/05/16.  He informs me he did have 2 occasions of diarrhea after completing his course of antibiotic but this responded to use of OTC antidiarrhea agents. He continues to smoke and has no other concerns at this time.  Past Medical History:  Diagnosis Date  . Abnormal CT of the chest    emphysemetous changes suggestive copd  . Dental caries   . Multiple fractures of ribs   . Pulmonary nodule   . Sinusitis   . Syncope   . Tobacco use     Past Surgical History:  Procedure Laterality Date  . PACEMAKER IMPLANT N/A 06/14/2016   Procedure: Pacemaker Implant-Dual chamber;  Surgeon: Marinus MawGregg W Taylor, MD;  [facility];  Service: Cardiovascular;  Laterality: N/A;    No Known Allergies   Outpatient Medications Prior to Visit  Medication Sig Dispense Refill  . budesonide-formoterol (SYMBICORT) 160-4.5 MCG/ACT inhaler Inhale 2 puffs into the lungs 2 (two) times daily. 1 Inhaler 2  . tiotropium (SPIRIVA) 18 MCG inhalation capsule Place 1 capsule (18 mcg total) into inhaler and inhale daily. 30 capsule 2  . ibuprofen (ADVIL,MOTRIN) 600 MG tablet Take 1 tablet (600 mg total) by mouth 3 (three) times daily as needed. 30 tablet 0  .  methocarbamol (ROBAXIN) 750 MG tablet Take 1 tablet (750 mg total) by mouth 3 (three) times daily as needed for muscle spasms. (Patient not taking: Reported on 06/21/2016) 20 tablet 0  . amoxicillin-clavulanate (AUGMENTIN) 875-125 MG tablet Take 1 tablet by mouth every 12 (twelve) hours. (Patient not taking: Reported on 06/27/2016) 6 tablet 0  . pantoprazole (PROTONIX) 40 MG tablet Take 1 tablet (40 mg total) by mouth daily. (Patient not taking: Reported on 06/27/2016) 30 tablet 1   No facility-administered medications prior to visit.     ROS Review of Systems  Constitutional: Negative for activity change and appetite change.  HENT: Negative for sinus pressure and sore throat.   Eyes: Negative for visual disturbance.  Respiratory: Negative for cough, chest tightness and shortness of breath.   Cardiovascular: Negative for chest pain and leg swelling.  Gastrointestinal: Negative for abdominal distention, abdominal pain, constipation and diarrhea.  Endocrine: Negative.   Genitourinary: Negative for dysuria.  Musculoskeletal: Negative for joint swelling and myalgias.  Skin: Negative for rash.  Allergic/Immunologic: Negative.   Neurological: Negative for weakness, light-headedness and numbness.  Psychiatric/Behavioral: Negative for dysphoric mood and suicidal ideas.    Objective:  BP 123/79 (BP Location: Left Arm, Patient Position: Sitting, Cuff Size: Normal)   Pulse 79   Temp 98 F (36.7 C) (Oral)   Resp 18   Ht 5\' 9"  (1.753 m)   Wt 168 lb 6.4 oz (76.4 kg)   SpO2 95%   BMI 24.87  kg/m   BP/Weight 07/12/2016 06/21/2016 06/15/2016  Systolic BP 123 121 124  Diastolic BP 79 73 84  Wt. (Lbs) 168.4 170.2 169.75  BMI 24.87 24.42 25.07      Physical Exam  Constitutional: He is oriented to person, place, and time. He appears well-developed and well-nourished.  Cardiovascular: Normal rate, normal heart sounds and intact distal pulses.   No murmur heard. Pulmonary/Chest: Effort normal and  breath sounds normal. He has no wheezes. He has no rales. He exhibits no tenderness.  Pacemaker in left upper chest wall  Abdominal: Soft. Bowel sounds are normal. He exhibits no distension and no mass. There is no tenderness.  Musculoskeletal: Normal range of motion.  Neurological: He is alert and oriented to person, place, and time.  Skin: Skin is warm and dry.  Psychiatric: He has a normal mood and affect.     Assessment & Plan:   1. Pulmonary nodule 6mm right lung pulmonary nodule Repeat CT chest due to in 6 months-12/2016 I have discussed with the patient that he is high risk for lung malignancy given history of smoking  2. S/P placement of cardiac pacemaker Secondary to sinus node dysfunction Status post interrogation  3. Closed fracture of multiple ribs of right side, initial encounter Improved significantly He rarely needs pain medications  4. Other emphysema (HCC) Currently on Spiriva and Symbicort Proventil added as a rescue inhaler   5. Tobacco use spent 3 minutes counseling on cessation and he is not ready to quit at this time.     Meds ordered this encounter  Medications  . albuterol (PROVENTIL HFA;VENTOLIN HFA) 108 (90 Base) MCG/ACT inhaler    Sig: Inhale 2 puffs into the lungs every 6 (six) hours as needed for wheezing or shortness of breath.    Dispense:  1 Inhaler    Refill:  3  . budesonide-formoterol (SYMBICORT) 160-4.5 MCG/ACT inhaler    Sig: Inhale 2 puffs into the lungs 2 (two) times daily.    Dispense:  1 Inhaler    Refill:  3  . tiotropium (SPIRIVA) 18 MCG inhalation capsule    Sig: Place 1 capsule (18 mcg total) into inhaler and inhale daily.    Dispense:  30 capsule    Refill:  3    Follow-up: Return in about 3 months (around 10/12/2016) for Follow-up of chronic medical conditions.   Jaclyn Shaggy MD

## 2016-07-12 NOTE — Progress Notes (Signed)
Patient is here for f/up  Patient concern is that has diarrhea once in a while   Patient has taking his medication for today   Patient denies pain for today

## 2016-07-13 MED FILL — !VENTOLIN HFA INHALER: 108 (90 BAS | 25 days supply | Qty: 18 | Fill #0

## 2016-07-13 MED FILL — !SYMBICORT 160-4.5 MCG INH: 160-4.5 | 15 days supply | Qty: 1 | Fill #0

## 2016-07-13 MED FILL — SPIRIVA 18 MCG CP-HANDIHALE: 18 | 30 days supply | Qty: 30 | Fill #0

## 2016-07-20 NOTE — Progress Notes (Signed)
Electrophysiology Office Note Date: 07/21/2016  ID:  Patrick Kelly, DOB [birth year], MRN [id number]  PCP: Jaclyn Shaggy, MD Electrophysiologist: [name]  CC: Pacemaker follow-up (His Bundle)  Patrick Kelly is a 66 y.o. male seen today for Dr [name].  He presents today for routine electrophysiology followup.  Since last being seen in our clinic, the patient reports doing very well. He has not had recurrent syncope post PPM implant.  He denies chest pain, palpitations, dyspnea, PND, orthopnea, nausea, vomiting, dizziness, syncope, edema, weight gain, or early satiety.   Device History: MDT dual chamber (His Bundle) PPM implanted 2018 for SSS   Past Medical History:  Diagnosis Date  . Abnormal CT of the chest    emphysemetous changes suggestive copd  . Dental caries   . Multiple fractures of ribs   . Pulmonary nodule   . Sinusitis   . Syncope   . Tobacco use    Past Surgical History:  Procedure Laterality Date  . PACEMAKER IMPLANT N/A 06/14/2016   Procedure: Pacemaker Implant-Dual chamber;  Surgeon: [name], MD;  Location: [facility] INVASIVE CV LAB;  Service: Cardiovascular;  Laterality: N/A;    Current Outpatient Prescriptions  Medication Sig Dispense Refill  . albuterol (PROVENTIL HFA;VENTOLIN HFA) 108 (90 Base) MCG/ACT inhaler Inhale 2 puffs into the lungs every 6 (six) hours as needed for wheezing or shortness of breath. 1 Inhaler 3  . budesonide-formoterol (SYMBICORT) 160-4.5 MCG/ACT inhaler Inhale 2 puffs into the lungs 2 (two) times daily. 1 Inhaler 3  . ibuprofen (ADVIL,MOTRIN) 600 MG tablet Take 1 tablet (600 mg total) by mouth 3 (three) times daily as needed. 30 tablet 0  . methocarbamol (ROBAXIN) 750 MG tablet Take 1 tablet (750 mg total) by mouth 3 (three) times daily as needed for muscle spasms. 20 tablet 0  . tiotropium (SPIRIVA) 18 MCG inhalation capsule Place 1 capsule (18 mcg total) into inhaler and inhale daily. 30 capsule 3   No current  facility-administered medications for this visit.     Allergies:   Patient has no known allergies.   Social History: Social History   Social History  . Marital status: Single    Spouse name: N/A  . Number of children: N/A  . Years of education: N/A   Occupational History  . Not on file.   Social History Main Topics  . Smoking status: Current Every Day Smoker    Types: Cigarettes  . Smokeless tobacco: Never Used  . Alcohol use No  . Drug use: No  . Sexual activity: Not on file   Other Topics Concern  . Not on file   Social History Narrative  . No narrative on file    Family History: Family History  Problem Relation Age of Onset  . Hypertension Sister      Review of Systems: All other systems reviewed and are otherwise negative except as noted above.   Physical Exam: VS:  BP (!) 140/96   Pulse 78   Ht 5\' 10"  (1.778 m)   Wt 168 lb 9.6 oz (76.5 kg)   SpO2 97%   BMI 24.19 kg/m  , BMI Body mass index is 24.19 kg/m.  GEN- The patient is well appearing, alert and oriented x 3 today.   HEENT: normocephalic, atraumatic; sclera clear, conjunctiva pink; hearing intact; oropharynx clear; neck supple  Lungs- Clear to ausculation bilaterally, normal work of breathing.  No wheezes, rales, rhonchi Heart- Regular rate and rhythm, no murmurs, rubs or gallops  GI- soft, non-tender, non-distended, bowel sounds present  Extremities- no clubbing, cyanosis, or edema  MS- no significant deformity or atrophy Skin- warm and dry, no rash or lesion; PPM pocket well healed Psych- euthymic mood, full affect Neuro- strength and sensation are intact  PPM Interrogation- reviewed in detail today,  See PACEART report  EKG:  EKG is ordered today. The ekg ordered today shows sinus rhythm  Recent Labs: 06/09/2016: ALT 7; TSH 0.506 06/12/2016: BUN 11; Potassium 3.9; Sodium 135 06/14/2016: Creatinine, Ser 0.80; Hemoglobin 12.5; Platelets 138   Wt Readings from Last 3 Encounters:    07/21/16 168 lb 9.6 oz (76.5 kg)  07/12/16 168 lb 6.4 oz (76.4 kg)  06/21/16 170 lb 3.2 oz (77.2 kg)     Other studies Reviewed: Additional studies/ records that were reviewed today include: hospital records   Assessment and Plan:  1.  Symptomatic bradycardia Normal PPM function See Pace Art report No changes today No recurrent syncope after PPM implant His capture non-selective from 5V to loss of capture at 0.5V  2.  Paroxysmal atrial fibrillation Burden by device interrogation 2.7% V rates slightly elevated CHADS2VASC is 1. We discussed current guidelines. He will start ASA 81mg  daily  Current medicines are reviewed at length with the patient today.   The patient does not have concerns regarding his medicines.  The following changes were made today:  Start ASA 81mg  daily   Labs/ tests ordered today include: none Orders Placed This Encounter  Procedures  . CUP PACEART INCLINIC DEVICE CHECK  . EKG 12-Lead     Disposition:   Follow up with Dr Ladona Ridgelaylor as scheduled     Signed, Gypsy BalsamAmber Hudson Majkowski, NP 07/21/2016 2:09 PM  [facility] [address] [place] [address] [phone] (office) [phone] (fax

## 2016-07-21 ENCOUNTER — Ambulatory Visit (INDEPENDENT_AMBULATORY_CARE_PROVIDER_SITE_OTHER): Payer: Self-pay | Admitting: Nurse Practitioner

## 2016-07-21 ENCOUNTER — Encounter: Payer: Self-pay | Admitting: Nurse Practitioner

## 2016-07-21 VITALS — BP 140/96 | HR 78 | Ht 70.0 in | Wt 168.6 lb

## 2016-07-21 DIAGNOSIS — I480 Paroxysmal atrial fibrillation: Secondary | ICD-10-CM

## 2016-07-21 DIAGNOSIS — R001 Bradycardia, unspecified: Secondary | ICD-10-CM

## 2016-07-21 LAB — CUP PACEART INCLINIC DEVICE CHECK
Date Time Interrogation Session: 20180517121152
Implantable Lead Implant Date: 20180410
Implantable Lead Model: 3830
Implantable Lead Model: 5076
MDC IDC LEAD IMPLANT DT: 20180410
MDC IDC LEAD LOCATION: 753859
MDC IDC LEAD LOCATION: 753860
MDC IDC PG IMPLANT DT: 20180410

## 2016-07-21 NOTE — Patient Instructions (Signed)
Medication Instructions:  Your physician has recommended you make the following change in your medication: 1.) START Aspirin 81 mg daily  Labwork: None Ordered  Testing/Procedures: None Orderedn  Follow-Up: Your physician recommends that you schedule a follow-up appointment in: with Dr. Ladona Ridgelaylor as scheduled   Any Other Special Instructions Will Be Listed Below (If Applicable).     If you need a refill on your cardiac medications before your next appointment, please call your pharmacy.  Thank you for choosing [facility]  Corrine CMA AAMA

## 2016-08-02 ENCOUNTER — Encounter: Payer: Self-pay | Admitting: Nurse Practitioner

## 2016-08-08 ENCOUNTER — Other Ambulatory Visit: Payer: Self-pay | Admitting: Family Medicine

## 2016-08-08 MED ORDER — FLUTICASONE FUROATE-VILANTEROL 200-25 MCG/INH IN AEPB: 1.0000 | INHALATION_SPRAY | Freq: Every day | RESPIRATORY_TRACT | 3 refills | Status: DC

## 2016-08-08 NOTE — Progress Notes (Signed)
[facility]ort [facility] to [facility] - formulary change.

## 2016-08-25 ENCOUNTER — Encounter: Payer: Self-pay | Admitting: Internal Medicine

## 2016-09-01 ENCOUNTER — Ambulatory Visit: Payer: Medicaid Other | Attending: Family Medicine

## 2016-09-01 MED FILL — !BREO ELLIPTA 200-25 MCG: 200-25 | 30 days supply | Qty: 60 | Fill #0

## 2016-09-01 MED FILL — SPIRIVA 18 MCG CP-HANDIHALE: 18 | 30 days supply | Qty: 30 | Fill #1

## 2016-09-09 ENCOUNTER — Other Ambulatory Visit: Payer: Self-pay | Admitting: *Deleted

## 2016-09-09 MED ORDER — FLUTICASONE FUROATE-VILANTEROL 200-25 MCG/INH IN AEPB: 1.0000 | INHALATION_SPRAY | Freq: Every day | RESPIRATORY_TRACT | 3 refills | Status: DC

## 2016-09-09 MED ORDER — UMECLIDINIUM BROMIDE 62.5 MCG/INH IN AEPB: 1.0000 | INHALATION_SPRAY | Freq: Every day | RESPIRATORY_TRACT | 3 refills | Status: DC

## 2016-09-09 MED ORDER — ALBUTEROL SULFATE HFA 108 (90 BASE) MCG/ACT IN AERS: 2.0000 | INHALATION_SPRAY | Freq: Four times a day (QID) | RESPIRATORY_TRACT | 3 refills | Status: DC | PRN

## 2016-09-09 NOTE — Telephone Encounter (Signed)
PRINTED FOR PASS PROGRAM 

## 2016-09-13 ENCOUNTER — Encounter: Payer: Self-pay | Admitting: Internal Medicine

## 2016-09-13 ENCOUNTER — Ambulatory Visit (INDEPENDENT_AMBULATORY_CARE_PROVIDER_SITE_OTHER): Payer: Self-pay | Admitting: Internal Medicine

## 2016-09-13 ENCOUNTER — Encounter (INDEPENDENT_AMBULATORY_CARE_PROVIDER_SITE_OTHER): Payer: Self-pay

## 2016-09-13 VITALS — BP 132/80 | HR 72 | Ht 70.5 in | Wt 170.8 lb

## 2016-09-13 DIAGNOSIS — I495 Sick sinus syndrome: Secondary | ICD-10-CM

## 2016-09-13 DIAGNOSIS — I480 Paroxysmal atrial fibrillation: Secondary | ICD-10-CM

## 2016-09-13 LAB — CUP PACEART INCLINIC DEVICE CHECK
Battery Remaining Longevity: 183 mo
Battery Voltage: 3.2 V
Brady Statistic AP VP Percent: 0 %
Brady Statistic AP VS Percent: 3.71 %
Brady Statistic AS VS Percent: 96.25 %
Brady Statistic RV Percent Paced: 0.03 %
Date Time Interrogation Session: 20180710101116
Implantable Lead Implant Date: 20180410
Implantable Lead Implant Date: 20180410
Implantable Lead Location: 753859
Implantable Lead Model: 5076
Implantable Pulse Generator Implant Date: 20180410
Lead Channel Impedance Value: 323 Ohm
Lead Channel Impedance Value: 418 Ohm
Lead Channel Impedance Value: 456 Ohm
Lead Channel Pacing Threshold Amplitude: 0.5 V
Lead Channel Pacing Threshold Amplitude: 0.75 V
Lead Channel Pacing Threshold Pulse Width: 0.4 ms
Lead Channel Sensing Intrinsic Amplitude: 2.75 mV
Lead Channel Setting Pacing Amplitude: 1.5 V
Lead Channel Setting Sensing Sensitivity: 2 mV
MDC IDC LEAD LOCATION: 753860
MDC IDC MSMT LEADCHNL RA IMPEDANCE VALUE: 323 Ohm
MDC IDC MSMT LEADCHNL RA SENSING INTR AMPL: 3.375 mV
MDC IDC MSMT LEADCHNL RV PACING THRESHOLD PULSEWIDTH: 1 ms
MDC IDC MSMT LEADCHNL RV SENSING INTR AMPL: 11.5 mV
MDC IDC MSMT LEADCHNL RV SENSING INTR AMPL: 9.75 mV
MDC IDC SET LEADCHNL RV PACING AMPLITUDE: 2 V
MDC IDC SET LEADCHNL RV PACING PULSEWIDTH: 1 ms
MDC IDC STAT BRADY AS VP PERCENT: 0.03 %
MDC IDC STAT BRADY RA PERCENT PACED: 3.72 %

## 2016-09-13 NOTE — Progress Notes (Signed)
HPI [name] returns today for ongoing followup of his DDD PM. He is a 66 yo man with symptomatic sinus node dysfunction and tobacco abuse who underwent DDD PM insertion with a His bundle lead back in April. He has done well in the interim.  No Known Allergies   Current Outpatient Prescriptions  Medication Sig Dispense Refill  . albuterol (PROVENTIL HFA;VENTOLIN HFA) 108 (90 Base) MCG/ACT inhaler Inhale 2 puffs into the lungs every 6 (six) hours as needed for wheezing or shortness of breath. 54 g 3  . ibuprofen (ADVIL,MOTRIN) 600 MG tablet Take 1 tablet (600 mg total) by mouth 3 (three) times daily as needed. 30 tablet 0  . methocarbamol (ROBAXIN) 750 MG tablet Take 1 tablet (750 mg total) by mouth 3 (three) times daily as needed for muscle spasms. 20 tablet 0  . tiotropium (SPIRIVA) 18 MCG inhalation capsule Place 1 capsule (18 mcg total) into inhaler and inhale daily. 30 capsule 3  . umeclidinium bromide (INCRUSE ELLIPTA) 62.5 MCG/INH AEPB Inhale 1 puff into the lungs daily. 90 each 3   No current facility-administered medications for this visit.      Past Medical History:  Diagnosis Date  . Abnormal CT of the chest    emphysemetous changes suggestive copd  . Dental caries   . Multiple fractures of ribs   . Pulmonary nodule   . Sinusitis   . Syncope   . Tobacco use     ROS:   All systems reviewed and negative except as noted in the HPI.   Past Surgical History:  Procedure Laterality Date  . PACEMAKER IMPLANT N/A 06/14/2016   Procedure: Pacemaker Implant-Dual chamber;  Surgeon: Marinus MawGregg W Elizabelle Fite, MD;  [facility];  Service: Cardiovascular;  Laterality: N/A;     Family History  Problem Relation Age of Onset  . Hypertension Sister      Social History   Social History  . Marital status: Single    Spouse name: N/A  . Number of children: N/A  . Years of education: N/A   Occupational History  . Not on file.   Social History Main Topics    . Smoking status: Current Every Day Smoker    Types: Cigarettes  . Smokeless tobacco: Never Used  . Alcohol use No  . Drug use: No  . Sexual activity: Not on file   Other Topics Concern  . Not on file   Social History Narrative  . No narrative on file     BP 132/80   Pulse 72   Ht 5' 10.5" (1.791 m)   Wt 170 lb 12.8 oz (77.5 kg)   SpO2 95%   BMI 24.16 kg/m   Physical Exam:  Well appearing 66 yo man, NAD HEENT: Unremarkable Neck:  6 cm JVD, no thyromegally Lymphatics:  No adenopathy Back:  No CVA tenderness Lungs:  Clear with no wheezes HEART:  Regular rate rhythm, no murmurs, no rubs, no clicks Abd:  soft, positive bowel sounds, no organomegally, no rebound, no guarding Ext:  2 plus pulses, no edema, no cyanosis, no clubbing Skin:  No rashes no nodules Neuro:  CN II through XII intact, motor grossly intact  EKG - nsr with DDD Pacing  DEVICE  Normal device function.  See PaceArt for details.   Assess/Plan: 1. Sinus node dysfunction - he is asymptomatic, s/p PPM insertion. 2. PPM - his medtronic DDD PM is working normally. We turned down his ouputs today  and have attempted to reprogram the device to allow for his own intrinsic conduction. 3. PAF - he is maintaining NSR very nicely. No change in meds.  4. Tobacco abuse - he is encouraged to stop smoking. He is not sure he wants to.  Leonia Reeves.D.

## 2016-09-13 NOTE — Patient Instructions (Signed)
Medication Instructions:  Your physician recommends that you continue on your current medications as directed. Please refer to the Current Medication list given to you today.   Labwork: None ordered.   Testing/Procedures: None ordered.   Follow-Up: Your physician wants you to follow-up in: 9 months with Dr. Ladona Ridgelaylor.   You will receive a reminder letter in the mail two months in advance. If you don't receive a letter, please call our office to schedule the follow-up appointment.  Remote monitoring is used to monitor your Pacemaker from home. This monitoring reduces the number of office visits required to check your device to one time per year. It allows us to keep an eye on the functioning of your device to ensure it is working properly. You are scheduled for a device check from home on 12/13/2016. You may send your transmission at any time that day. If you have a wireless device, the transmission will be sent automatically. After your physician reviews your transmission, you will receive a postcard with your next transmission date.    Any Other Special Instructions Will Be Listed Below (If Applicable).     If you need a refill on your cardiac medications before your next appointment, please call your pharmacy.

## 2016-09-26 ENCOUNTER — Other Ambulatory Visit: Payer: Self-pay | Admitting: Internal Medicine

## 2016-10-12 ENCOUNTER — Ambulatory Visit: Payer: Self-pay | Admitting: Family Medicine

## 2016-10-18 ENCOUNTER — Ambulatory Visit: Payer: Medicare Other | Attending: Family Medicine | Admitting: Family Medicine

## 2016-10-18 ENCOUNTER — Encounter: Payer: Self-pay | Admitting: Family Medicine

## 2016-10-18 VITALS — BP 126/73 | HR 73 | Temp 97.8°F | Ht 70.5 in | Wt 174.0 lb

## 2016-10-18 DIAGNOSIS — R9389 Abnormal findings on diagnostic imaging of other specified body structures: Secondary | ICD-10-CM

## 2016-10-18 DIAGNOSIS — R938 Abnormal findings on diagnostic imaging of other specified body structures: Secondary | ICD-10-CM

## 2016-10-18 DIAGNOSIS — Z950 Presence of cardiac pacemaker: Secondary | ICD-10-CM | POA: Insufficient documentation

## 2016-10-18 DIAGNOSIS — I480 Paroxysmal atrial fibrillation: Secondary | ICD-10-CM | POA: Insufficient documentation

## 2016-10-18 DIAGNOSIS — R911 Solitary pulmonary nodule: Secondary | ICD-10-CM | POA: Insufficient documentation

## 2016-10-18 DIAGNOSIS — F17210 Nicotine dependence, cigarettes, uncomplicated: Secondary | ICD-10-CM | POA: Insufficient documentation

## 2016-10-18 DIAGNOSIS — J438 Other emphysema: Secondary | ICD-10-CM | POA: Insufficient documentation

## 2016-10-18 DIAGNOSIS — Z79899 Other long term (current) drug therapy: Secondary | ICD-10-CM | POA: Insufficient documentation

## 2016-10-18 MED FILL — **INCRUSE ELLIPTA 62.5 MCG: 62.5 MCG | 14 days supply | Qty: 14 | Fill #0

## 2016-10-18 NOTE — Patient Instructions (Signed)
Bupropion sustained-release tablets (smoking cessation) What is this medicine? BUPROPION (byoo PROE pee on) is used to help people quit smoking. This medicine may be used for other purposes; ask your health care provider or pharmacist if you have questions. COMMON BRAND NAME(S): Buproban, Zyban What should I tell my health care provider before I take this medicine? They need to know if you have any of these conditions: -an eating disorder, such as anorexia or bulimia -bipolar disorder or psychosis -diabetes or high blood sugar, treated with medication -glaucoma -head injury or brain tumor -heart disease, previous heart attack, or irregular heart beat -high blood pressure -kidney or liver disease -seizures -suicidal thoughts or a previous suicide attempt -Tourette's syndrome -weight loss -an unusual or allergic reaction to bupropion, other medicines, foods, dyes, or preservatives -breast-feeding -pregnant or trying to become pregnant How should I use this medicine? Take this medicine by mouth with a glass of water. Follow the directions on the prescription label. You can take it with or without food. If it upsets your stomach, take it with food. Do not cut, crush or chew this medicine. Take your medicine at regular intervals. If you take this medicine more than once a day, take your second dose at least 8 hours after you take your first dose. To limit difficulty in sleeping, avoid taking this medicine at bedtime. Do not take your medicine more often than directed. Do not stop taking this medicine suddenly except upon the advice of your doctor. Stopping this medicine too quickly may cause serious side effects. A special MedGuide will be given to you by the pharmacist with each prescription and refill. Be sure to read this information carefully each time. Talk to your pediatrician regarding the use of this medicine in children. Special care may be needed. Overdosage: If you think you have  taken too much of this medicine contact a poison control center or emergency room at once. NOTE: This medicine is only for you. Do not share this medicine with others. What if I miss a dose? If you miss a dose, skip the missed dose and take your next tablet at the regular time. There should be at least 8 hours between doses. Do not take double or extra doses. What may interact with this medicine? Do not take this medicine with any of the following medications: -linezolid -MAOIs like Azilect, Carbex, Eldepryl, Marplan, Nardil, and Parnate -methylene blue (injected into a vein) -other medicines that contain bupropion like Wellbutrin This medicine may also interact with the following medications: -alcohol -certain medicines for anxiety or sleep -certain medicines for blood pressure like metoprolol, propranolol -certain medicines for depression or psychotic disturbances -certain medicines for HIV or AIDS like efavirenz, lopinavir, nelfinavir, ritonavir -certain medicines for irregular heart beat like propafenone, flecainide -certain medicines for Parkinson's disease like amantadine, levodopa -certain medicines for seizures like carbamazepine, phenytoin, phenobarbital -cimetidine -clopidogrel -cyclophosphamide -digoxin -furazolidone -isoniazid -nicotine -orphenadrine -procarbazine -steroid medicines like prednisone or cortisone -stimulant medicines for attention disorders, weight loss, or to stay awake -tamoxifen -theophylline -thiotepa -ticlopidine -tramadol -warfarin This list may not describe all possible interactions. Give your health care provider a list of all the medicines, herbs, non-prescription drugs, or dietary supplements you use. Also tell them if you smoke, drink alcohol, or use illegal drugs. Some items may interact with your medicine. What should I watch for while using this medicine? Visit your doctor or health care professional for regular checks on your progress.  This medicine should be used together with a   patient support program. It is important to participate in a behavioral program, counseling, or other support program that is recommended by your health care professional. Patients and their families should watch out for new or worsening thoughts of suicide or depression. Also watch out for sudden changes in feelings such as feeling anxious, agitated, panicky, irritable, hostile, aggressive, impulsive, severely restless, overly excited and hyperactive, or not being able to sleep. If this happens, especially at the beginning of treatment or after a change in dose, call your health care professional. Avoid alcoholic drinks while taking this medicine. Drinking excessive alcoholic beverages, using sleeping or anxiety medicines, or quickly stopping the use of these agents while taking this medicine may increase your risk for a seizure. Do not drive or use heavy machinery until you know how this medicine affects you. This medicine can impair your ability to perform these tasks. Do not take this medicine close to bedtime. It may prevent you from sleeping. Your mouth may get dry. Chewing sugarless gum or sucking hard candy, and drinking plenty of water may help. Contact your doctor if the problem does not go away or is severe. Do not use nicotine patches or chewing gum without the advice of your doctor or health care professional while taking this medicine. You may need to have your blood pressure taken regularly if your doctor recommends that you use both nicotine and this medicine together. What side effects may I notice from receiving this medicine? Side effects that you should report to your doctor or health care professional as soon as possible: -allergic reactions like skin rash, itching or hives, swelling of the face, lips, or tongue -breathing problems -changes in vision -confusion -elevated mood, decreased need for sleep, racing thoughts, impulsive  behavior -fast or irregular heartbeat -hallucinations, loss of contact with reality -increased blood pressure -redness, blistering, peeling or loosening of the skin, including inside the mouth -seizures -suicidal thoughts or other mood changes -unusually weak or tired -vomiting Side effects that usually do not require medical attention (report to your doctor or health care professional if they continue or are bothersome): -constipation -headache -loss of appetite -nausea -tremors -weight loss This list may not describe all possible side effects. Call your doctor for medical advice about side effects. You may report side effects to FDA at 1-800-FDA-1088. Where should I keep my medicine? Keep out of the reach of children. Store at room temperature between 20 and 25 degrees C (68 and 77 degrees F). Protect from light. Keep container tightly closed. Throw away any unused medicine after the expiration date. NOTE: This sheet is a summary. It may not cover all possible information. If you have questions about this medicine, talk to your doctor, pharmacist, or health care provider.  2018 [employer]vier/Gold Standard (2015-08-14 13:49:28)  

## 2016-10-18 NOTE — Progress Notes (Signed)
Subjective:    Patient ID: Patrick Kelly, male    DOB: 04/17/1950, 66 y.o.   MRN: [id number]  HPI He is a 66 year old male with a history of tobacco abuse, emphysema, paroxysmal A. fib, sinus node dysfunction (status post [employer] dual-chamber pacemaker implant) diagnosed after syncope and fall in which he sustained right rib fractures; right fractures have healed. CT chest at that time had revealed multiple right-sided rib fractures, a 6mm right pulmonary nodule with recommendations for 6-12 month follow-up. Also revealed emphysema.  Last visit to the EP cardiologist was in 09/13/16 with no change in management recommended, device check scheduled for 12/2016 in follow-up in 9 months. He denies chest pains, shortness of breath, lightheadedness or syncope.  Continues to smoke one pack of cigarettes per day and is not ready to quit.  Emphysema is stable on Spiriva and Proventil MDI  Past Medical History:  Diagnosis Date  . Abnormal CT of the chest    emphysemetous changes suggestive copd  . Dental caries   . Multiple fractures of ribs   . Pulmonary nodule   . Sinusitis   . Syncope   . Tobacco use     Past Surgical History:  Procedure Laterality Date  . PACEMAKER IMPLANT N/A 06/14/2016   Procedure: Pacemaker Implant-Dual chamber;  Surgeon: Marinus MawGregg W Taylor, MD;  Location: [facility] INVASIVE CV LAB;  Service: Cardiovascular;  Laterality: N/A;    No Known Allergies  Current Outpatient Prescriptions on File Prior to Visit  Medication Sig Dispense Refill  . albuterol (PROVENTIL HFA;VENTOLIN HFA) 108 (90 Base) MCG/ACT inhaler Inhale 2 puffs into the lungs every 6 (six) hours as needed for wheezing or shortness of breath. 54 g 3  . tiotropium (SPIRIVA) 18 MCG inhalation capsule Place 1 capsule (18 mcg total) into inhaler and inhale daily. 30 capsule 3  . umeclidinium bromide (INCRUSE ELLIPTA) 62.5 MCG/INH AEPB Inhale 1 puff into the lungs daily. 90 each 3   No current facility-administered  medications on file prior to visit.      Review of Systems Constitutional: Negative for activity change and appetite change.  HENT: Negative for sinus pressure and sore throat.   Eyes: Negative for visual disturbance.  Respiratory: Negative for cough, chest tightness and shortness of breath.   Cardiovascular: Negative for chest pain and leg swelling.  Gastrointestinal: Negative for abdominal distention, abdominal pain, constipation and diarrhea.  Endocrine: Negative.   Genitourinary: Negative for dysuria.  Musculoskeletal: Negative for joint swelling and myalgias.  Skin: Negative for rash.  Allergic/Immunologic: Negative.   Neurological: Negative for weakness, light-headedness and numbness.  Psychiatric/Behavioral: Negative for dysphoric mood and suicidal ideas.      Objective: Vitals:   10/18/16 1357  BP: 126/73  Pulse: 73  Temp: 97.8 F (36.6 C)  TempSrc: Oral  SpO2: 95%  Weight: 174 lb (78.9 kg)  Height: 5' 10.5" (1.791 m)      Physical Exam Constitutional: He is oriented to person, place, and time. He appears well-developed and well-nourished.  Cardiovascular: Normal rate, normal heart sounds and intact distal pulses.   No murmur heard. Pulmonary/Chest: Effort normal and breath sounds normal. He has no wheezes. He has no rales. He exhibits no tenderness.  Pacemaker in left upper chest wall  Abdominal: Soft. Bowel sounds are normal. He exhibits no distension and no mass. There is no tenderness.  Musculoskeletal: Normal range of motion.  Neurological: He is alert and oriented to person, place, and time.  Skin: Skin is warm and dry.  Psychiatric: He has a normal mood and affect.        Assessment & Plan:  1. Pulmonary nodule 6mm right lung pulmonary nodule Repeat CT chest due in 6 months-12/2016 I have discussed with the patient that he is high risk for lung malignancy given history of smoking  2. S/P placement of cardiac pacemaker /Paroxysmal A. fib Secondary  to sinus node dysfunction Status post interrogation  3. Other emphysema (HCC) Currently on Spiriva; Incruse ordered last month via the patient assistance program Continue Proventil rescue inhaler   4. Tobacco use spent 3 minutes counseling on cessation and he is not ready to quit at this time.  This note has been created with Education officer, environmental. Any transcriptional errors are unintentional.

## 2016-12-13 ENCOUNTER — Ambulatory Visit (INDEPENDENT_AMBULATORY_CARE_PROVIDER_SITE_OTHER): Payer: Self-pay | Admitting: *Deleted

## 2016-12-13 DIAGNOSIS — I495 Sick sinus syndrome: Secondary | ICD-10-CM

## 2016-12-14 LAB — CUP PACEART REMOTE DEVICE CHECK
Battery Remaining Longevity: 179 mo
Brady Statistic AP VP Percent: 0.01 %
Brady Statistic AP VS Percent: 6.2 %
Brady Statistic AS VP Percent: 0.03 %
Brady Statistic AS VS Percent: 93.76 %
Date Time Interrogation Session: 20181009052501
Implantable Lead Implant Date: 20180410
Implantable Lead Location: 753860
Lead Channel Impedance Value: 323 Ohm
Lead Channel Impedance Value: 323 Ohm
Lead Channel Impedance Value: 399 Ohm
Lead Channel Pacing Threshold Amplitude: 1 V
Lead Channel Pacing Threshold Pulse Width: 0.4 ms
Lead Channel Sensing Intrinsic Amplitude: 3.75 mV
Lead Channel Sensing Intrinsic Amplitude: 8.75 mV
Lead Channel Sensing Intrinsic Amplitude: 8.75 mV
Lead Channel Setting Pacing Amplitude: 1.5 V
Lead Channel Setting Pacing Pulse Width: 1 ms
MDC IDC LEAD IMPLANT DT: 20180410
MDC IDC LEAD LOCATION: 753859
MDC IDC MSMT BATTERY VOLTAGE: 3.18 V
MDC IDC MSMT LEADCHNL RA PACING THRESHOLD AMPLITUDE: 0.625 V
MDC IDC MSMT LEADCHNL RA PACING THRESHOLD PULSEWIDTH: 0.4 ms
MDC IDC MSMT LEADCHNL RA SENSING INTR AMPL: 3.75 mV
MDC IDC MSMT LEADCHNL RV IMPEDANCE VALUE: 437 Ohm
MDC IDC PG IMPLANT DT: 20180410
MDC IDC SET LEADCHNL RV PACING AMPLITUDE: 2 V
MDC IDC SET LEADCHNL RV SENSING SENSITIVITY: 2 mV
MDC IDC STAT BRADY RA PERCENT PACED: 6.16 %
MDC IDC STAT BRADY RV PERCENT PACED: 0.06 %

## 2016-12-14 NOTE — Progress Notes (Signed)
Remote pacemaker transmission.   

## 2016-12-16 ENCOUNTER — Encounter: Payer: Self-pay | Admitting: Cardiology

## 2017-03-14 ENCOUNTER — Ambulatory Visit (INDEPENDENT_AMBULATORY_CARE_PROVIDER_SITE_OTHER): Payer: Self-pay | Admitting: *Deleted

## 2017-03-14 DIAGNOSIS — I495 Sick sinus syndrome: Secondary | ICD-10-CM

## 2017-03-14 NOTE — Progress Notes (Signed)
Remote pacemaker transmission.   

## 2017-03-15 ENCOUNTER — Encounter: Payer: Self-pay | Admitting: Cardiology

## 2017-03-16 LAB — CUP PACEART REMOTE DEVICE CHECK
Battery Voltage: 3.15 V
Brady Statistic AP VS Percent: 1.44 %
Brady Statistic AS VP Percent: 0.03 %
Date Time Interrogation Session: 20190108053245
Implantable Lead Location: 753860
Implantable Lead Model: 3830
Implantable Lead Model: 5076
Implantable Pulse Generator Implant Date: 20180410
Lead Channel Impedance Value: 323 Ohm
Lead Channel Impedance Value: 513 Ohm
Lead Channel Pacing Threshold Amplitude: 1 V
Lead Channel Pacing Threshold Pulse Width: 0.4 ms
Lead Channel Sensing Intrinsic Amplitude: 3.75 mV
Lead Channel Sensing Intrinsic Amplitude: 8.75 mV
Lead Channel Sensing Intrinsic Amplitude: 8.75 mV
Lead Channel Setting Pacing Amplitude: 2 V
Lead Channel Setting Pacing Pulse Width: 1 ms
MDC IDC LEAD IMPLANT DT: 20180410
MDC IDC LEAD IMPLANT DT: 20180410
MDC IDC LEAD LOCATION: 753859
MDC IDC MSMT BATTERY REMAINING LONGEVITY: 177 mo
MDC IDC MSMT LEADCHNL RA PACING THRESHOLD AMPLITUDE: 0.75 V
MDC IDC MSMT LEADCHNL RA PACING THRESHOLD PULSEWIDTH: 0.4 ms
MDC IDC MSMT LEADCHNL RA SENSING INTR AMPL: 3.75 mV
MDC IDC MSMT LEADCHNL RV IMPEDANCE VALUE: 323 Ohm
MDC IDC MSMT LEADCHNL RV IMPEDANCE VALUE: 418 Ohm
MDC IDC SET LEADCHNL RA PACING AMPLITUDE: 1.5 V
MDC IDC SET LEADCHNL RV SENSING SENSITIVITY: 2 mV
MDC IDC STAT BRADY AP VP PERCENT: 0 %
MDC IDC STAT BRADY AS VS PERCENT: 98.53 %
MDC IDC STAT BRADY RA PERCENT PACED: 1.44 %
MDC IDC STAT BRADY RV PERCENT PACED: 0.03 %

## 2017-05-22 ENCOUNTER — Telehealth: Payer: Self-pay | Admitting: Internal Medicine

## 2017-05-22 NOTE — Telephone Encounter (Signed)
Called patient - no VM box set up
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
## 2017-05-23 NOTE — Telephone Encounter (Signed)
Spoke w/ pt and instructed him to send a manual transmission w/ his home monitor. PT states that he feels fine today. He is currently at work and won't 8-9 PM After consulting w/ Device Tech RN informed pt that is ok to wait until tonight to send remote transmission. If he feels dizzy / passes out again he needs to go to ER by EMS.

## 2017-05-24 NOTE — Telephone Encounter (Signed)
Transmissions received and reviewed. Presenting: SR. Three episodes of brief 1:1 SVT ~150bpm- none from time of episode on Saturday. He reports that he has not had any dizziness or passing out since Saturday. I advised him to call 911 and be evaluated in the ER if he has another episode. He verbalizes understanding and confirms his appt with Dr. Ladona Ridgelaylor 06/16/17.

## 2017-06-13 ENCOUNTER — Ambulatory Visit (INDEPENDENT_AMBULATORY_CARE_PROVIDER_SITE_OTHER): Payer: Self-pay | Admitting: *Deleted

## 2017-06-13 ENCOUNTER — Encounter: Payer: Self-pay | Admitting: Internal Medicine

## 2017-06-13 ENCOUNTER — Telehealth: Payer: Self-pay | Admitting: Cardiology

## 2017-06-13 DIAGNOSIS — I495 Sick sinus syndrome: Secondary | ICD-10-CM

## 2017-06-13 NOTE — Telephone Encounter (Signed)
Attempted to confirm remote transmission with pt. No answer and was unable to leave a message.   

## 2017-06-14 NOTE — Progress Notes (Signed)
Remote pacemaker transmission.   

## 2017-06-15 ENCOUNTER — Encounter: Payer: Self-pay | Admitting: Cardiology

## 2017-06-16 ENCOUNTER — Encounter: Payer: Self-pay | Admitting: Internal Medicine

## 2017-06-16 ENCOUNTER — Ambulatory Visit (INDEPENDENT_AMBULATORY_CARE_PROVIDER_SITE_OTHER): Payer: Self-pay | Admitting: Internal Medicine

## 2017-06-16 VITALS — BP 140/86 | HR 73 | Ht 70.5 in | Wt 161.0 lb

## 2017-06-16 DIAGNOSIS — R911 Solitary pulmonary nodule: Secondary | ICD-10-CM

## 2017-06-16 DIAGNOSIS — I480 Paroxysmal atrial fibrillation: Secondary | ICD-10-CM

## 2017-06-16 DIAGNOSIS — I495 Sick sinus syndrome: Secondary | ICD-10-CM

## 2017-06-16 DIAGNOSIS — Z950 Presence of cardiac pacemaker: Secondary | ICD-10-CM

## 2017-06-16 MED ORDER — WARFARIN SODIUM 5 MG PO TABS: 5.0000 mg | ORAL_TABLET | Freq: Every day | ORAL | 3 refills | Status: DC

## 2017-06-16 NOTE — Patient Instructions (Addendum)
Medication Instructions:  Your physician has recommended you make the following change in your medication:  1.  Start taking warfarin 5 mg one tablet by mouth daily every evening.  Labwork: Return to [facility] clinic in one week for a PT/INR check and further management.    Testing/Procedures: Non-Cardiac CT scanning, (CAT scanning), is a noninvasive, special x-ray that produces cross-sectional images of the body using x-rays and a computer. CT scans help physicians diagnose and treat medical conditions. For some CT exams, a contrast material is used to enhance visibility in the area of the body being studied. CT scans provide greater clarity and reveal more details than regular x-ray exams.  Please schedule for a chest CT without contrast.  Please try to schedule next week same day as his coumadin clinic appointment.  Follow-Up: Your physician wants you to follow-up in: one year with Dr. [name].   You will receive a reminder letter in the mail two months in advance. If you don't receive a letter, please call our office to schedule the follow-up appointment.  Remote monitoring is used to monitor your Pacemaker from home. This monitoring reduces the number of office visits required to check your device to one time per year. It allows [place] to keep an eye on the functioning of your device to ensure it is working properly. You are scheduled for a device check from home on 09/12/2017. You may send your transmission at any time that day. If you have a wireless device, the transmission will be sent automatically. After your physician reviews your transmission, you will receive a postcard with your next transmission date.  Any Other Special Instructions Will Be Listed Below (If Applicable).  If you need a refill on your cardiac medications before your next appointment, please call your pharmacy.  Warfarin tablets What is this medicine? WARFARIN (WAR far in) is an anticoagulant. It is used to  treat or prevent clots in the veins, arteries, lungs, or heart. This medicine may be used for other purposes; ask your health care provider or pharmacist if you have questions. COMMON BRAND NAME(S): Coumadin, Jantoven What should I tell my health care provider before I take this medicine? They need to know if you have any of these conditions: -alcoholism -anemia -bleeding disorders -cancer -diabetes -heart disease -high blood pressure -history of bleeding in the gastrointestinal tract -history of stroke or other brain injury or disease -kidney or liver disease -protein C deficiency -protein S deficiency -psychosis or dementia -recent injury, recent or planned surgery or procedure -an unusual or allergic reaction to warfarin, other medicines, foods, dyes, or preservatives -pregnant or trying to get pregnant -breast-feeding How should I use this medicine? Take this medicine by mouth with a glass of water. Follow the directions on the prescription label. You can take this medicine with or without food. Take your medicine at the same time each day. Do not take it more often than directed. Do not stop taking except on your doctor's advice. Stopping this medicine may increase your risk of a blood clot. Be sure to refill your prescription before you run out of medicine. If your doctor or healthcare professional calls to change your dose, write down the dose and any other instructions. Always read the dose and instructions back to him or her to make sure you understand them. Tell your doctor or healthcare professional what strength of tablets you have on hand. Ask how many tablets you should take to equal your new dose. Write the  date on the new instructions and keep them near your medicine. If you are told to stop taking your medicine until your next blood test, call your doctor or healthcare professional if you do not hear anything within 24 hours of the test to find out your new dose or when to  restart your prior dose. A special MedGuide will be given to you by the pharmacist with each prescription and refill. Be sure to read this information carefully each time. Talk to your pediatrician regarding the use of this medicine in children. Special care may be needed. Overdosage: If you think you have taken too much of this medicine contact a poison control center or emergency room at once. NOTE: This medicine is only for you. Do not share this medicine with others. What if I miss a dose? It is important not to miss a dose. If you miss a dose, call your healthcare provider. Take the dose as soon as possible on the same day. If it is almost time for your next dose, take only that dose. Do not take double or extra doses to make up for a missed dose. What may interact with this medicine? Do not take this medicine with any of the following medications: -agents that prevent or dissolve blood clots -aspirin or other salicylates -danshen -dextrothyroxine -mifepristone -St. John's Wort -red yeast rice This medicine may also interact with the following medications: -acetaminophen -agents that lower cholesterol -alcohol -allopurinol -amiodarone -antibiotics or medicines for treating bacterial, fungal or viral infections -azathioprine -barbiturate medicines for inducing sleep or treating seizures -certain medicines for diabetes -certain medicines for heart rhythm problems -certain medicines for hepatitis C virus infections like daclatasvir, dasabuvir; ombitasvir; paritaprevir; ritonavir, elbasvir; grazoprevir, ledipasvir; sofosbuvir, simeprevir, sofosbuvir, sofosbuvir; velpatasvir, sofosbuvir; velpatasvir; voxilaprevir -certain medicines for high blood pressure -chloral hydrate -cisapride -conivaptan -disulfiram -male hormones, including contraceptive or birth control pills -general anesthetics -herbal or dietary products like garlic, ginkgo, ginseng, green tea, or kava  kava -influenza virus vaccine -male hormones -medicines for mental depression or psychosis -medicines for some types of cancer -medicines for stomach problems -methylphenidate -NSAIDs, medicines for pain and inflammation, like ibuprofen or naproxen -propoxyphene -quinidine, quinine -raloxifene -seizure or epilepsy medicine like carbamazepine, phenytoin, and valproic acid -steroids like cortisone and prednisone -tamoxifen -thyroid medicine -tramadol -vitamin c, vitamin e, and vitamin K -zafirlukast -zileuton This list may not describe all possible interactions. Give your health care provider a list of all the medicines, herbs, non-prescription drugs, or dietary supplements you use. Also tell them if you smoke, drink alcohol, or use illegal drugs. Some items may interact with your medicine. What should I watch for while using this medicine? Visit your doctor or health care professional for regular checks on your progress. You will need to have a blood test called a PT/INR regularly. The PT/INR blood test is done to make sure you are getting the right dose of this medicine. It is important to not miss your appointment for the blood tests. When you first start taking this medicine, these tests are done often. Once the correct dose is determined and you take your medicine properly, these tests can be done less often. Notify your doctor or health care professional and seek emergency treatment if you develop breathing problems; changes in vision; chest pain; severe, sudden headache; pain, swelling, warmth in the leg; trouble speaking; sudden numbness or weakness of the face, arm or leg. These can be signs that your condition has gotten worse. While you are taking this  medicine, carry an identification card with your name, the name and dose of medicine(s) being used, and the name and phone number of your doctor or health care professional or person to contact in an emergency. Do not start taking or  stop taking any medicines or over-the-counter medicines except on the advice of your doctor or health care professional. You should discuss your diet with your doctor or health care professional. Do not make major changes in your diet. Vitamin K can affect how well this medicine works. Many foods contain vitamin K. It is important to eat a consistent amount of foods with vitamin K. Other foods with vitamin K that you should eat in consistent amounts are asparagus, basil, black eyed peas, broccoli, brussel sprouts, cabbage, green onions, green tea, parsley, green leafy vegetables like beet greens, collard greens, kale, spinach, turnip greens, or certain lettuces like green leaf or romaine. This medicine can cause birth defects or bleeding in an unborn child. Women of childbearing age should use effective birth control while taking this medicine. If a woman becomes pregnant while taking this medicine, she should discuss the potential risks and her options with her health care professional. Avoid sports and activities that might cause injury while you are using this medicine. Severe falls or injuries can cause unseen bleeding. Be careful when using sharp tools or knives. Consider using an Neurosurgeon. Take special care brushing or flossing your teeth. Report any injuries, bruising, or red spots on the skin to your doctor or health care professional. If you have an illness that causes vomiting, diarrhea, or fever for more than a few days, contact your doctor. Also check with your doctor if you are unable to eat for several days. These problems can change the effect of this medicine. Even after you stop taking this medicine, it takes several days before your body recovers its normal ability to clot blood. Ask your doctor or health care professional how long you need to be careful. If you are going to have surgery or dental work, tell your doctor or health care professional that you have been taking this  medicine. What side effects may I notice from receiving this medicine? Side effects that you should report to your doctor or health care professional as soon as possible: -allergic reactions like skin rash, itching or hives, swelling of the face, lips, or tongue -breathing problems -chest pain -dizziness -headache -heavy menstrual bleeding or vaginal bleeding -pain in the lower back or side -painful, blue or purple toes -painful skin ulcers that do not go away -signs and symptoms of bleeding such as bloody or black, tarry stools; red or dark-brown urine; spitting up blood or brown material that looks like coffee grounds; red spots on the skin; unusual bruising or bleeding from the eye, gums, or nose -stomach pain -unusually weak or tired Side effects that usually do not require medical attention (report to your doctor or health care professional if they continue or are bothersome): -diarrhea -hair loss This list may not describe all possible side effects. Call your doctor for medical advice about side effects. You may report side effects to FDA at 1-800-FDA-1088. Where should I keep my medicine? Keep out of the reach of children. Store at room temperature between 15 and 30 degrees C (59 and 86 degrees F). Protect from light. Throw away any unused medicine after the expiration date. Do not flush down the toilet. NOTE: This sheet is a summary. It may not cover all possible information. If  you have questions about this medicine, talk to your doctor, pharmacist, or health care provider.  2018 Elsevier/Gold Standard (2016-02-11 11:27:41)

## 2017-06-16 NOTE — Progress Notes (Signed)
HPI [name] returns today for PPM followup. He is a pleasant 67 yo man with a h/o sinus node dysfunction, s/p PPM insertion. He has not had any symptomatic atrial fib but his PPM interogation demonstrates a few episodes lasting several hours. He is not on a blood thinner. He was also noted to have a lung nodule and is pending repeat CT of the chest. He feels well. He had a syncopal episode with no arrhythmias noted on the PPM interogation. No Known Allergies   No current outpatient medications on file.   No current facility-administered medications for this visit.      Past Medical History:  Diagnosis Date  . Abnormal CT of the chest    emphysemetous changes suggestive copd  . Dental caries   . Multiple fractures of ribs   . Pulmonary nodule   . Sinusitis   . Syncope   . Tobacco use     ROS:   All systems reviewed and negative except as noted in the HPI.   Past Surgical History:  Procedure Laterality Date  . PACEMAKER IMPLANT N/A 06/14/2016   Procedure: Pacemaker Implant-Dual chamber;  Surgeon: Marinus MawGregg W Haiven Nardone, MD;  [facility];  Service: Cardiovascular;  Laterality: N/A;     Family History  Problem Relation Age of Onset  . Hypertension Sister      Social History   Socioeconomic History  . Marital status: Single    Spouse name: Not on file  . Number of children: Not on file  . Years of education: Not on file  . Highest education level: Not on file  Occupational History  . Not on file  Social Needs  . Financial resource strain: Not on file  . Food insecurity:    Worry: Not on file    Inability: Not on file  . Transportation needs:    Medical: Not on file    Non-medical: Not on file  Tobacco Use  . Smoking status: Current Every Day Smoker    Types: Cigarettes  . Smokeless tobacco: Never Used  Substance and Sexual Activity  . Alcohol use: No  . Drug use: No  . Sexual activity: Not on file  Lifestyle  . Physical activity:    Days per week: Not on file    Minutes per session: Not on file  . Stress: Not on file  Relationships  . Social connections:    Talks on phone: Not on file    Gets together: Not on file    Attends religious service: Not on file    Active member of club or organization: Not on file    Attends meetings of clubs or organizations: Not on file    Relationship status: Not on file  . Intimate partner violence:    Fear of current or ex partner: Not on file    Emotionally abused: Not on file    Physically abused: Not on file    Forced sexual activity: Not on file  Other Topics Concern  . Not on file  Social History Narrative  . Not on file     BP 140/86   Pulse 73   Ht 5' 10.5" (1.791 m)   Wt 161 lb (73 kg)   BMI 22.77 kg/m   Physical Exam:  Well appearing 67 yo man, NAD HEENT: Unremarkable Neck:  No JVD, no thyromegally Lymphatics:  No adenopathy Back:  No CVA tenderness Lungs:  Clear with no wheezes HEART:  Regular rate rhythm, no murmurs, no rubs, no clicks Abd:  soft, positive bowel sounds, no organomegally, no rebound, no guarding Ext:  2 plus pulses, no edema, no cyanosis, no clubbing Skin:  No rashes no nodules Neuro:  CN II through XII intact, motor grossly intact  EKG - NSR DEVICE  Normal device function.  See PaceArt for details.   Assess/Plan: 1. Sinus node dysfunction - he is asymptomatic, s/p PPM insertion. 2. PPM - his medtronic device is working normally. Will recheck in several months 3. PAF - he is maintaining NSR 99% of the time. I have recommended her start systemic anti-coagulation but he prefers warfarin over an OAC. 4. Lung nodule - he will undergo a non-contrast CT of the chest  Jayson Waterhouse,M.D.

## 2017-06-19 LAB — CUP PACEART INCLINIC DEVICE CHECK
Brady Statistic RA Percent Paced: 0.1 % — CL
Brady Statistic RV Percent Paced: 2.8 %
Date Time Interrogation Session: 20190415134928
Implantable Lead Implant Date: 20180410
Implantable Lead Location: 753859
Implantable Lead Model: 5076
Lead Channel Pacing Threshold Pulse Width: 0.4 ms
MDC IDC LEAD IMPLANT DT: 20180410
MDC IDC LEAD LOCATION: 753860
MDC IDC MSMT LEADCHNL RA PACING THRESHOLD AMPLITUDE: 0.75 V
MDC IDC MSMT LEADCHNL RA PACING THRESHOLD PULSEWIDTH: 0.4 ms
MDC IDC MSMT LEADCHNL RA SENSING INTR AMPL: 3.1 mV
MDC IDC MSMT LEADCHNL RV PACING THRESHOLD AMPLITUDE: 1.25 V
MDC IDC MSMT LEADCHNL RV SENSING INTR AMPL: 7.1 mV
MDC IDC PG IMPLANT DT: 20180410

## 2017-06-23 ENCOUNTER — Ambulatory Visit (INDEPENDENT_AMBULATORY_CARE_PROVIDER_SITE_OTHER)
Admission: RE | Admit: 2017-06-23 | Discharge: 2017-06-23 | Disposition: A | Discharge status: Home / Self Care | Payer: Self-pay | Source: Ambulatory Visit | Attending: Internal Medicine | Admitting: Internal Medicine

## 2017-06-23 ENCOUNTER — Ambulatory Visit (INDEPENDENT_AMBULATORY_CARE_PROVIDER_SITE_OTHER): Payer: Self-pay | Admitting: *Deleted

## 2017-06-23 DIAGNOSIS — I480 Paroxysmal atrial fibrillation: Secondary | ICD-10-CM

## 2017-06-23 DIAGNOSIS — Z5181 Encounter for therapeutic drug level monitoring: Secondary | ICD-10-CM

## 2017-06-23 DIAGNOSIS — R911 Solitary pulmonary nodule: Secondary | ICD-10-CM

## 2017-06-23 LAB — POCT INR: INR: 3.2

## 2017-06-23 NOTE — Patient Instructions (Addendum)
Description   Tomorrow only take 1/2 tablet, then Start taking 1 tablet daily except 1/2 tablet on Mondays, Wednesdays and Fridays.  Eat some greens today since you already took your dose today. Recheck INR next Friday in 1 week. Call Coumadin clinic with new medications #[phone]      A full discussion of the nature of anticoagulants has been carried out.  A benefit risk analysis has been presented to the patient, so that they understand the justification for choosing anticoagulation at this time. The need for frequent and regular monitoring, precise dosage adjustment and compliance is stressed.  Side effects of potential bleeding are discussed.  The patient should avoid any OTC items containing aspirin or ibuprofen, and should avoid great swings in general diet.  Avoid alcohol consumption.  Call if any signs of abnormal bleeding.

## 2017-06-30 ENCOUNTER — Ambulatory Visit (INDEPENDENT_AMBULATORY_CARE_PROVIDER_SITE_OTHER): Payer: Self-pay | Admitting: *Deleted

## 2017-06-30 DIAGNOSIS — I480 Paroxysmal atrial fibrillation: Secondary | ICD-10-CM

## 2017-06-30 DIAGNOSIS — Z5181 Encounter for therapeutic drug level monitoring: Secondary | ICD-10-CM

## 2017-06-30 LAB — POCT INR: INR: 3.3

## 2017-06-30 NOTE — Patient Instructions (Signed)
Description   Do not take coumadin tomorrow April 27th as he has already taken coumadin today  then change coumadin dose to  1/2  tablet daily except 1 tablet on Tuesdays Thursdays and Saturdays.  Eat some greens today since you already took your dose today. Recheck INR next Friday in 1 week. Call Coumadin clinic with new medications #[phone]

## 2017-07-04 LAB — CUP PACEART REMOTE DEVICE CHECK
Battery Remaining Longevity: 174 mo
Brady Statistic AS VP Percent: 0.04 %
Brady Statistic RA Percent Paced: 3.31 %
Date Time Interrogation Session: 20190410054718
Implantable Lead Implant Date: 20180410
Implantable Lead Location: 753859
Implantable Lead Location: 753860
Implantable Lead Model: 3830
Implantable Pulse Generator Implant Date: 20180410
Lead Channel Impedance Value: 304 Ohm
Lead Channel Pacing Threshold Pulse Width: 0.4 ms
Lead Channel Pacing Threshold Pulse Width: 0.4 ms
Lead Channel Sensing Intrinsic Amplitude: 3.125 mV
Lead Channel Sensing Intrinsic Amplitude: 7.5 mV
Lead Channel Setting Pacing Pulse Width: 1 ms
MDC IDC LEAD IMPLANT DT: 20180410
MDC IDC MSMT BATTERY VOLTAGE: 3.12 V
MDC IDC MSMT LEADCHNL RA IMPEDANCE VALUE: 532 Ohm
MDC IDC MSMT LEADCHNL RA PACING THRESHOLD AMPLITUDE: 0.875 V
MDC IDC MSMT LEADCHNL RA SENSING INTR AMPL: 3.125 mV
MDC IDC MSMT LEADCHNL RV IMPEDANCE VALUE: 304 Ohm
MDC IDC MSMT LEADCHNL RV IMPEDANCE VALUE: 437 Ohm
MDC IDC MSMT LEADCHNL RV PACING THRESHOLD AMPLITUDE: 1.25 V
MDC IDC MSMT LEADCHNL RV SENSING INTR AMPL: 7.5 mV
MDC IDC SET LEADCHNL RA PACING AMPLITUDE: 1.5 V
MDC IDC SET LEADCHNL RV PACING AMPLITUDE: 2 V
MDC IDC SET LEADCHNL RV SENSING SENSITIVITY: 2 mV
MDC IDC STAT BRADY AP VP PERCENT: 0.01 %
MDC IDC STAT BRADY AP VS PERCENT: 3.4 %
MDC IDC STAT BRADY AS VS PERCENT: 96.57 %
MDC IDC STAT BRADY RV PERCENT PACED: 0.07 %

## 2017-07-07 ENCOUNTER — Ambulatory Visit (INDEPENDENT_AMBULATORY_CARE_PROVIDER_SITE_OTHER): Payer: Self-pay | Admitting: *Deleted

## 2017-07-07 ENCOUNTER — Telehealth: Payer: Self-pay | Admitting: Internal Medicine

## 2017-07-07 DIAGNOSIS — Z5181 Encounter for therapeutic drug level monitoring: Secondary | ICD-10-CM

## 2017-07-07 DIAGNOSIS — I480 Paroxysmal atrial fibrillation: Secondary | ICD-10-CM

## 2017-07-07 LAB — POCT INR: INR: 2

## 2017-07-07 NOTE — Patient Instructions (Signed)
Description   Continue same dose of  coumadin   1/2  tablet daily except 1 tablet on Tuesdays Thursdays and Saturdays.  Eat greens every other day. Recheck INR next Friday  1 week. Call Coumadin clinic with new medications #[phone]

## 2017-07-07 NOTE — Telephone Encounter (Signed)
Follow Up:    Pt would like CT results from 06-23-17 please.o

## 2017-07-10 NOTE — Telephone Encounter (Signed)
Outreach made to Pt.  Call went to VM.  Per message VM not set up.

## 2017-07-12 NOTE — Telephone Encounter (Signed)
Spoke with EC Claris Che d/t unable to get through to Pt phone. CT results given.   EC will give results to Pt.   Pt will call if any further questions.

## 2017-07-14 ENCOUNTER — Ambulatory Visit (INDEPENDENT_AMBULATORY_CARE_PROVIDER_SITE_OTHER): Payer: Self-pay | Admitting: Pharmacist

## 2017-07-14 DIAGNOSIS — I480 Paroxysmal atrial fibrillation: Secondary | ICD-10-CM

## 2017-07-14 DIAGNOSIS — Z5181 Encounter for therapeutic drug level monitoring: Secondary | ICD-10-CM

## 2017-07-14 LAB — POCT INR: INR: 2.4

## 2017-07-14 NOTE — Patient Instructions (Signed)
Description   Continue same dose of  coumadin   1/2  tablet daily except 1 tablet on Tuesdays Thursdays and Saturdays.  Eat greens every other day. Recheck INR next Friday  1 week. Call Coumadin clinic with new medications #[phone]     

## 2017-07-21 ENCOUNTER — Ambulatory Visit (INDEPENDENT_AMBULATORY_CARE_PROVIDER_SITE_OTHER): Payer: Self-pay | Admitting: *Deleted

## 2017-07-21 DIAGNOSIS — Z5181 Encounter for therapeutic drug level monitoring: Secondary | ICD-10-CM

## 2017-07-21 DIAGNOSIS — I480 Paroxysmal atrial fibrillation: Secondary | ICD-10-CM

## 2017-07-21 LAB — POCT INR: INR: 2.9

## 2017-07-21 NOTE — Patient Instructions (Addendum)
Description   Continue same dose of coumadin 1/2 tablet daily except 1 tablet on Tuesdays Thursdays and Saturdays.  Eat greens 4 times per week. Recheck INR next Friday 2 weeks. Call Coumadin clinic with new medications #[phone]

## 2017-08-04 ENCOUNTER — Ambulatory Visit (INDEPENDENT_AMBULATORY_CARE_PROVIDER_SITE_OTHER): Payer: Self-pay | Admitting: *Deleted

## 2017-08-04 DIAGNOSIS — I480 Paroxysmal atrial fibrillation: Secondary | ICD-10-CM

## 2017-08-04 DIAGNOSIS — Z5181 Encounter for therapeutic drug level monitoring: Secondary | ICD-10-CM

## 2017-08-04 LAB — POCT INR: INR: 2.4 (ref 2.0–3.0)

## 2017-08-04 NOTE — Patient Instructions (Signed)
Description   Continue same dose of coumadin 1/2 tablet daily except 1 tablet on Tuesdays Thursdays and Saturdays.  Eat greens 4 times per week. Recheck INR next Friday 3 weeks. Call Coumadin clinic with new medications #[phone]

## 2017-08-25 ENCOUNTER — Ambulatory Visit (INDEPENDENT_AMBULATORY_CARE_PROVIDER_SITE_OTHER): Payer: Self-pay | Admitting: *Deleted

## 2017-08-25 DIAGNOSIS — Z5181 Encounter for therapeutic drug level monitoring: Secondary | ICD-10-CM

## 2017-08-25 DIAGNOSIS — I480 Paroxysmal atrial fibrillation: Secondary | ICD-10-CM

## 2017-08-25 LAB — POCT INR: INR: 3.4 — AB (ref 2.0–3.0)

## 2017-08-25 NOTE — Patient Instructions (Signed)
Description   Do not take coumadin today then continue same dose of coumadin 1/2 tablet daily except 1 tablet on Tuesdays Thursdays and Saturdays.  Eat greens 4 times per week. Recheck INR 2 weeks. Call Coumadin clinic with new medications #[phone]

## 2017-09-08 ENCOUNTER — Encounter (INDEPENDENT_AMBULATORY_CARE_PROVIDER_SITE_OTHER): Payer: Self-pay

## 2017-09-08 ENCOUNTER — Ambulatory Visit (INDEPENDENT_AMBULATORY_CARE_PROVIDER_SITE_OTHER): Payer: Self-pay | Admitting: *Deleted

## 2017-09-08 DIAGNOSIS — Z5181 Encounter for therapeutic drug level monitoring: Secondary | ICD-10-CM

## 2017-09-08 DIAGNOSIS — I480 Paroxysmal atrial fibrillation: Secondary | ICD-10-CM

## 2017-09-08 LAB — POCT INR: INR: 1.4 — AB (ref 2.0–3.0)

## 2017-09-08 NOTE — Patient Instructions (Signed)
Description   Today 1 tablet and tomorrow take 1.5 tablets then continue same dose of coumadin 1/2 tablet daily except 1 tablet on Tuesdays, Thursdays, and Saturdays.  Eat greens 4 times per week. Recheck INR 1 week. Call Coumadin clinic with new medications #[phone]

## 2017-09-12 ENCOUNTER — Ambulatory Visit (INDEPENDENT_AMBULATORY_CARE_PROVIDER_SITE_OTHER): Payer: Self-pay | Admitting: *Deleted

## 2017-09-12 DIAGNOSIS — I495 Sick sinus syndrome: Secondary | ICD-10-CM

## 2017-09-12 NOTE — Progress Notes (Signed)
Remote pacemaker transmission.   

## 2017-09-15 ENCOUNTER — Ambulatory Visit (INDEPENDENT_AMBULATORY_CARE_PROVIDER_SITE_OTHER): Payer: Medicare Other | Admitting: *Deleted

## 2017-09-15 DIAGNOSIS — Z5181 Encounter for therapeutic drug level monitoring: Secondary | ICD-10-CM

## 2017-09-15 DIAGNOSIS — I480 Paroxysmal atrial fibrillation: Secondary | ICD-10-CM | POA: Diagnosis not present

## 2017-09-15 LAB — POCT INR: INR: 2.7 (ref 2.0–3.0)

## 2017-09-15 NOTE — Patient Instructions (Signed)
Description   Continue same dose of coumadin 1/2 tablet daily except 1 tablet on Tuesdays, Thursdays, and Saturdays.  Eat greens 4 times per week. Recheck INR 2 weeks. Call Coumadin clinic with new medications #[phone]

## 2017-09-27 LAB — CUP PACEART REMOTE DEVICE CHECK
Brady Statistic AP VP Percent: 0 %
Brady Statistic AP VS Percent: 4.76 %
Brady Statistic AS VP Percent: 0.03 %
Brady Statistic AS VS Percent: 95.21 %
Brady Statistic RA Percent Paced: 4.78 %
Brady Statistic RV Percent Paced: 0.04 %
Date Time Interrogation Session: 20190709112509
Implantable Lead Implant Date: 20180410
Implantable Lead Location: 753860
Implantable Lead Model: 3830
Implantable Lead Model: 5076
Lead Channel Impedance Value: 304 Ohm
Lead Channel Impedance Value: 418 Ohm
Lead Channel Impedance Value: 475 Ohm
Lead Channel Pacing Threshold Amplitude: 1 V
Lead Channel Pacing Threshold Pulse Width: 0.4 ms
Lead Channel Sensing Intrinsic Amplitude: 2.625 mV
Lead Channel Sensing Intrinsic Amplitude: 7.5 mV
Lead Channel Sensing Intrinsic Amplitude: 7.5 mV
Lead Channel Setting Pacing Amplitude: 2 V
Lead Channel Setting Sensing Sensitivity: 2 mV
MDC IDC LEAD IMPLANT DT: 20180410
MDC IDC LEAD LOCATION: 753859
MDC IDC MSMT BATTERY REMAINING LONGEVITY: 171 mo
MDC IDC MSMT BATTERY VOLTAGE: 3.08 V
MDC IDC MSMT LEADCHNL RA PACING THRESHOLD AMPLITUDE: 0.75 V
MDC IDC MSMT LEADCHNL RA PACING THRESHOLD PULSEWIDTH: 0.4 ms
MDC IDC MSMT LEADCHNL RA SENSING INTR AMPL: 2.625 mV
MDC IDC MSMT LEADCHNL RV IMPEDANCE VALUE: 304 Ohm
MDC IDC PG IMPLANT DT: 20180410
MDC IDC SET LEADCHNL RA PACING AMPLITUDE: 1.5 V
MDC IDC SET LEADCHNL RV PACING PULSEWIDTH: 1 ms

## 2017-09-29 ENCOUNTER — Ambulatory Visit (INDEPENDENT_AMBULATORY_CARE_PROVIDER_SITE_OTHER): Payer: Medicare Other | Admitting: *Deleted

## 2017-09-29 DIAGNOSIS — Z5181 Encounter for therapeutic drug level monitoring: Secondary | ICD-10-CM

## 2017-09-29 DIAGNOSIS — I480 Paroxysmal atrial fibrillation: Secondary | ICD-10-CM

## 2017-09-29 LAB — POCT INR: INR: 4.2 — AB (ref 2.0–3.0)

## 2017-09-29 NOTE — Patient Instructions (Signed)
Description   Hold today's dose of Coumadin then continue same dose of coumadin 1/2 tablet daily except 1 tablet on Tuesdays, Thursdays, and Saturdays. Have a large serving of greens today and continue  greens 4 times per week. Recheck INR 2 weeks. Call Coumadin clinic with new medications #[phone]

## 2017-10-13 ENCOUNTER — Ambulatory Visit (INDEPENDENT_AMBULATORY_CARE_PROVIDER_SITE_OTHER): Payer: Medicare Other | Admitting: *Deleted

## 2017-10-13 DIAGNOSIS — I480 Paroxysmal atrial fibrillation: Secondary | ICD-10-CM | POA: Diagnosis not present

## 2017-10-13 DIAGNOSIS — Z5181 Encounter for therapeutic drug level monitoring: Secondary | ICD-10-CM | POA: Diagnosis not present

## 2017-10-13 LAB — POCT INR: INR: 4 — AB (ref 2.0–3.0)

## 2017-10-13 NOTE — Patient Instructions (Signed)
Description   Hold today's dose of Coumadin then start taking 1/2 tablet daily except 1 tablet on Wednesdays.  Have a large serving of greens today and continue  greens 4 times per week. Recheck INR 2 weeks. Call Coumadin clinic with new medications #[phone]

## 2017-10-27 ENCOUNTER — Ambulatory Visit (INDEPENDENT_AMBULATORY_CARE_PROVIDER_SITE_OTHER): Payer: Medicare Other | Admitting: Pharmacist

## 2017-10-27 DIAGNOSIS — Z5181 Encounter for therapeutic drug level monitoring: Secondary | ICD-10-CM

## 2017-10-27 DIAGNOSIS — I480 Paroxysmal atrial fibrillation: Secondary | ICD-10-CM | POA: Diagnosis not present

## 2017-10-27 LAB — POCT INR: INR: 2.4 (ref 2.0–3.0)

## 2017-10-27 NOTE — Patient Instructions (Signed)
Description   Continue taking 1/2 tablet daily except 1 tablet on Wednesdays.  Continue greens 4 times per week. Recheck INR 2 weeks. Call Coumadin clinic with new medications #[phone]

## 2017-11-01 ENCOUNTER — Other Ambulatory Visit: Payer: Self-pay

## 2017-11-01 ENCOUNTER — Encounter (HOSPITAL_COMMUNITY): Payer: Self-pay | Admitting: Emergency Medicine

## 2017-11-01 ENCOUNTER — Emergency Department (HOSPITAL_COMMUNITY): Payer: Medicare Other

## 2017-11-01 ENCOUNTER — Emergency Department (HOSPITAL_COMMUNITY)
Admission: EM | Admit: 2017-11-01 | Discharge: 2017-11-02 | Disposition: A | Discharge status: Home / Self Care | Payer: Medicare Other | Attending: Emergency Medicine | Admitting: Emergency Medicine

## 2017-11-01 DIAGNOSIS — R55 Syncope and collapse: Secondary | ICD-10-CM | POA: Diagnosis not present

## 2017-11-01 DIAGNOSIS — I10 Essential (primary) hypertension: Secondary | ICD-10-CM | POA: Insufficient documentation

## 2017-11-01 DIAGNOSIS — F17210 Nicotine dependence, cigarettes, uncomplicated: Secondary | ICD-10-CM | POA: Insufficient documentation

## 2017-11-01 DIAGNOSIS — R404 Transient alteration of awareness: Secondary | ICD-10-CM | POA: Insufficient documentation

## 2017-11-01 DIAGNOSIS — Z7901 Long term (current) use of anticoagulants: Secondary | ICD-10-CM | POA: Insufficient documentation

## 2017-11-01 DIAGNOSIS — R410 Disorientation, unspecified: Secondary | ICD-10-CM | POA: Diagnosis not present

## 2017-11-01 LAB — BASIC METABOLIC PANEL
Anion gap: 8 (ref 5–15)
BUN: 12 mg/dL (ref 8–23)
CO2: 25 mmol/L (ref 22–32)
Calcium: 9 mg/dL (ref 8.9–10.3)
Chloride: 105 mmol/L (ref 98–111)
Creatinine, Ser: 1.18 mg/dL (ref 0.61–1.24)
GFR calc Af Amer: >60 mL/min (ref >60)
GLUCOSE: 93 mg/dL (ref 70–99)
Potassium: 4.2 mmol/L (ref 3.5–5.1)
Sodium: 138 mmol/L (ref 135–145)

## 2017-11-01 LAB — CBC
HCT: 41.5 % (ref 39.0–52.0)
Hemoglobin: 13.6 g/dL (ref 13.0–17.0)
MCH: 33.5 pg (ref 26.0–34.0)
MCHC: 32.8 g/dL (ref 30.0–36.0)
MCV: 102.2 fL — ABNORMAL HIGH (ref 78.0–100.0)
PLATELETS: 171 10*3/uL (ref 150–400)
RBC: 4.06 MIL/uL — ABNORMAL LOW (ref 4.22–5.81)
RDW: 13 % (ref 11.5–15.5)
WBC: 7.4 10*3/uL (ref 4.0–10.5)

## 2017-11-01 LAB — PROTIME-INR
INR: 1.61
Prothrombin Time: 19 seconds — ABNORMAL HIGH (ref 11.4–15.2)
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
## 2017-11-01 NOTE — ED Notes (Signed)
ED Provider at bedside. 

## 2017-11-01 NOTE — ED Notes (Signed)
Patient transported to X-ray 
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
## 2017-11-02 ENCOUNTER — Emergency Department (HOSPITAL_COMMUNITY): Payer: Medicare Other

## 2017-11-02 ENCOUNTER — Encounter (HOSPITAL_COMMUNITY): Payer: Self-pay | Admitting: Radiology

## 2017-11-02 DIAGNOSIS — R55 Syncope and collapse: Secondary | ICD-10-CM | POA: Diagnosis not present

## 2017-11-02 DIAGNOSIS — R410 Disorientation, unspecified: Secondary | ICD-10-CM | POA: Diagnosis not present

## 2017-11-02 LAB — RAPID URINE DRUG SCREEN, HOSP PERFORMED
Amphetamines: NOT DETECTED
BARBITURATES: NOT DETECTED
BENZODIAZEPINES: NOT DETECTED
COCAINE: NOT DETECTED
Opiates: NOT DETECTED
TETRAHYDROCANNABINOL: NOT DETECTED

## 2017-11-02 LAB — HEPATIC FUNCTION PANEL
ALT: 10 U/L (ref 0–44)
AST: 32 U/L (ref 15–41)
Albumin: 3.5 g/dL (ref 3.5–5.0)
Alkaline Phosphatase: 88 U/L (ref 38–126)
BILIRUBIN DIRECT: 0.2 mg/dL (ref 0.0–0.2)
BILIRUBIN TOTAL: 0.9 mg/dL (ref 0.3–1.2)
Indirect Bilirubin: 0.7 mg/dL (ref 0.3–0.9)
Total Protein: 6.3 g/dL — ABNORMAL LOW (ref 6.5–8.1)

## 2017-11-02 LAB — URINALYSIS, ROUTINE W REFLEX MICROSCOPIC
BILIRUBIN URINE: NEGATIVE
Glucose, UA: NEGATIVE mg/dL
HGB URINE DIPSTICK: NEGATIVE
Ketones, ur: NEGATIVE mg/dL
Leukocytes, UA: NEGATIVE
NITRITE: NEGATIVE
PROTEIN: NEGATIVE mg/dL
Specific Gravity, Urine: 1.016 (ref 1.005–1.030)
pH: 6 (ref 5.0–8.0)

## 2017-11-02 LAB — TROPONIN I: Troponin I: <0.03 ng/mL (ref <0.03)

## 2017-11-02 LAB — CBG MONITORING, ED: Glucose-Capillary: 115 mg/dL — ABNORMAL HIGH (ref 70–99)

## 2017-11-02 LAB — ETHANOL
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
## 2017-11-02 NOTE — Discharge Instructions (Addendum)
Follow-up with neurology-- call for appt. Also recommend close follow-up with your cardiologist. Return to the ED for new or worsening symptoms such as slurred speech DO NOT DRIVE until cleared by neurology.

## 2017-11-02 NOTE — ED Notes (Signed)
Patient transported to MRI 

## 2017-11-02 NOTE — ED Notes (Signed)
CBG: 115 

## 2017-11-02 NOTE — ED Provider Notes (Signed)
[facility]  Refer to PA Allyne GeeSanders note for full history and physical examination.  Briefly, patient is a 67 year old male with history of paroxysmal A. fib, hypertension, emphysema, PVD with multiple near syncopal episodes yesterday in which he has what he describes as "blanking out ".  No episodes while in the ED.  Normal neuro exam.  Awaiting MRI.  Remainder work-up thus far has been reassuring.  If MRI shows no acute changes, patient stable for discharge with follow-up with neurology on an outpatient basis.   MDM   MRI with chronic microvascular changes but no acute findings.  Patient is resting comfortably, no focal neuro deficits noted on my assessment.  Stable for discharge home with follow-up with neurology on an outpatient basis.  Advised patient he should avoid driving until cleared by neurology.  Discussed strict ED return precautions. Pt verbalized understanding of and agreement with plan and is safe for discharge home at this time.        Jeanie SewerFawze, Enzo Treu A, PA-C 11/02/17 1202    Melene PlanFloyd, Dan, DO 11/02/17 1322

## 2017-11-10 ENCOUNTER — Ambulatory Visit (INDEPENDENT_AMBULATORY_CARE_PROVIDER_SITE_OTHER): Payer: Medicare Other | Admitting: *Deleted

## 2017-11-10 DIAGNOSIS — I480 Paroxysmal atrial fibrillation: Secondary | ICD-10-CM

## 2017-11-10 DIAGNOSIS — Z5181 Encounter for therapeutic drug level monitoring: Secondary | ICD-10-CM | POA: Diagnosis not present

## 2017-11-10 LAB — POCT INR: INR: 2.7 (ref 2.0–3.0)

## 2017-11-10 NOTE — Patient Instructions (Signed)
Description   Continue taking 1/2 tablet daily except 1 tablet on Wednesdays.  Continue greens 4 times per week. Recheck INR 3 weeks. Call Coumadin clinic with new medications #[phone]

## 2017-12-01 ENCOUNTER — Ambulatory Visit (INDEPENDENT_AMBULATORY_CARE_PROVIDER_SITE_OTHER): Payer: Medicare Other | Admitting: Pharmacist

## 2017-12-01 DIAGNOSIS — Z5181 Encounter for therapeutic drug level monitoring: Secondary | ICD-10-CM | POA: Diagnosis not present

## 2017-12-01 DIAGNOSIS — I480 Paroxysmal atrial fibrillation: Secondary | ICD-10-CM | POA: Diagnosis not present

## 2017-12-01 LAB — POCT INR: INR: 1.6 — AB (ref 2.0–3.0)

## 2017-12-01 NOTE — Patient Instructions (Signed)
Description   Take 1 tablet today and tomorrow, then continue taking 1/2 tablet daily except 1 tablet on Wednesdays.  Continue greens 4 times per week. Recheck INR 2 weeks. Call Coumadin clinic with new medications #[phone]

## 2017-12-12 ENCOUNTER — Ambulatory Visit (INDEPENDENT_AMBULATORY_CARE_PROVIDER_SITE_OTHER): Payer: Medicare Other | Admitting: *Deleted

## 2017-12-12 DIAGNOSIS — I495 Sick sinus syndrome: Secondary | ICD-10-CM

## 2017-12-13 NOTE — Progress Notes (Signed)
Remote pacemaker transmission.   

## 2017-12-15 ENCOUNTER — Ambulatory Visit (INDEPENDENT_AMBULATORY_CARE_PROVIDER_SITE_OTHER): Payer: Medicare Other | Admitting: *Deleted

## 2017-12-15 DIAGNOSIS — Z5181 Encounter for therapeutic drug level monitoring: Secondary | ICD-10-CM | POA: Diagnosis not present

## 2017-12-15 DIAGNOSIS — I480 Paroxysmal atrial fibrillation: Secondary | ICD-10-CM

## 2017-12-15 LAB — POCT INR: INR: 2 (ref 2.0–3.0)

## 2017-12-15 NOTE — Patient Instructions (Signed)
Description   Change your dose to 1/2 tablet daily except 1 tablet on Mondays and Fridays.   Continue greens 4 times per week. Recheck INR 2-3weeks. Call Coumadin clinic with new medications #[phone]

## 2017-12-20 ENCOUNTER — Ambulatory Visit: Payer: Medicare Other | Admitting: Family Medicine

## 2017-12-21 ENCOUNTER — Encounter: Payer: Self-pay | Admitting: Cardiology

## 2017-12-27 ENCOUNTER — Encounter

## 2017-12-27 ENCOUNTER — Encounter: Payer: Self-pay | Admitting: Neurology

## 2017-12-27 ENCOUNTER — Telehealth: Payer: Self-pay | Admitting: Neurology

## 2017-12-27 ENCOUNTER — Ambulatory Visit (INDEPENDENT_AMBULATORY_CARE_PROVIDER_SITE_OTHER): Payer: Medicare Other | Admitting: Neurology

## 2017-12-27 VITALS — BP 146/88 | HR 69 | Ht 70.5 in | Wt 182.8 lb

## 2017-12-27 DIAGNOSIS — G459 Transient cerebral ischemic attack, unspecified: Secondary | ICD-10-CM | POA: Diagnosis not present

## 2017-12-27 DIAGNOSIS — R7309 Other abnormal glucose: Secondary | ICD-10-CM

## 2017-12-27 DIAGNOSIS — R55 Syncope and collapse: Secondary | ICD-10-CM

## 2017-12-27 DIAGNOSIS — R419 Unspecified symptoms and signs involving cognitive functions and awareness: Secondary | ICD-10-CM | POA: Diagnosis not present

## 2017-12-27 DIAGNOSIS — R718 Other abnormality of red blood cells: Secondary | ICD-10-CM

## 2017-12-27 DIAGNOSIS — E538 Deficiency of other specified B group vitamins: Secondary | ICD-10-CM | POA: Diagnosis not present

## 2017-12-27 NOTE — Telephone Encounter (Signed)
medicare no auth faxed pacemaker info to [employer] if it gets approved they will reach out to the pt to schedule i tried calling the patient to inform him of this but his voicemail box has not been set up

## 2017-12-27 NOTE — Patient Instructions (Signed)
   TIA vs seizure vs cardiac etiology of presyncope/syncope:   - MRI of the brain w/wo contrast ([employer]tronic [id number] (serial number [id number]) right atrial lead and a [employer] (serial number [id number]) right ventricular lead, The leads were then connected to a [employer] (serial number [id number]) pacemaker. - I recommend seeing Patrick Kelly his cardiologist for a remote device check and also echocardiogram and carotid dopplers to complete a TIA workup - EEG and if all workup above it negative would consider ambulatory eeg 24-72 hours  - Patient is unable to drive, operate heavy machinery, perform activities at heights or participate in water activities until 6-12 months seizure or syncope free.  - seizure precautions  - MCV was elevated, will check B12  - HgbA1c, Lipid Panel
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
## 2017-12-28 NOTE — Telephone Encounter (Signed)
I left a voicemail informing the patient his MRI is scheduled at [facility] for 01/05/18 arrival time is 12:30 I also left their number of [phone] if for some reason he needed to r/s.

## 2018-01-01 ENCOUNTER — Telehealth: Payer: Self-pay | Admitting: *Deleted

## 2018-01-01 LAB — METHYLMALONIC ACID, SERUM: Methylmalonic Acid: 319 nmol/L (ref 0–378)

## 2018-01-01 LAB — B12 AND FOLATE PANEL
Folate: 12.3 ng/mL (ref >3.0)
Vitamin B-12: 339 pg/mL (ref 232–1245)

## 2018-01-01 LAB — HEMOGLOBIN A1C
ESTIMATED AVERAGE GLUCOSE: 94 mg/dL
HEMOGLOBIN A1C: 4.9 % (ref 4.8–5.6)

## 2018-01-01 LAB — LIPID PANEL
CHOL/HDL RATIO: 3.6 ratio (ref 0.0–5.0)
CHOLESTEROL TOTAL: 134 mg/dL (ref 100–199)
HDL: 37 mg/dL — AB (ref >39)
LDL Calculated: 85 mg/dL (ref 0–99)
TRIGLYCERIDES: 58 mg/dL (ref 0–149)
VLDL Cholesterol Cal: 12 mg/dL (ref 5–40)

## 2018-01-01 NOTE — Telephone Encounter (Signed)
Pt's sister Darel Hong (on [place]) aware of unremarkable labs. She asked when pt's MRI was. Confirmed it's 01/08/18 @ 1:00 pm at [facility]. Advised her to go to main entrance of the hospital for pt to be checked in for the MRI.

## 2018-01-01 NOTE — Telephone Encounter (Signed)
-----   Message from Anson Fret, MD sent at 01/01/2018 12:49 PM EDT ----- Labs are unremarkable thanks

## 2018-01-02 ENCOUNTER — Ambulatory Visit (INDEPENDENT_AMBULATORY_CARE_PROVIDER_SITE_OTHER): Payer: Medicare Other | Admitting: *Deleted

## 2018-01-02 DIAGNOSIS — I480 Paroxysmal atrial fibrillation: Secondary | ICD-10-CM

## 2018-01-02 DIAGNOSIS — Z5181 Encounter for therapeutic drug level monitoring: Secondary | ICD-10-CM | POA: Diagnosis not present

## 2018-01-02 LAB — POCT INR: INR: 2.4 (ref 2.0–3.0)

## 2018-01-02 NOTE — Patient Instructions (Addendum)
Description   Continue taking 1/2 tablet daily except 1 tablet on Mondays and Fridays.  Continue greens 4 times per week. Recheck INR 3 weeks. Call Coumadin clinic with new medications #[phone]

## 2018-01-04 LAB — CUP PACEART REMOTE DEVICE CHECK
Brady Statistic AP VP Percent: 0 %
Brady Statistic AP VS Percent: 4.45 %
Brady Statistic AS VP Percent: 0.03 %
Brady Statistic RA Percent Paced: 4.38 %
Brady Statistic RV Percent Paced: 0.06 %
Implantable Lead Location: 753859
Implantable Lead Location: 753860
Implantable Lead Model: 3830
Lead Channel Impedance Value: 323 Ohm
Lead Channel Impedance Value: 437 Ohm
Lead Channel Pacing Threshold Pulse Width: 0.4 ms
Lead Channel Sensing Intrinsic Amplitude: 3.125 mV
Lead Channel Sensing Intrinsic Amplitude: 6.75 mV
Lead Channel Sensing Intrinsic Amplitude: 6.75 mV
Lead Channel Setting Pacing Amplitude: 1.5 V
Lead Channel Setting Pacing Amplitude: 2 V
Lead Channel Setting Pacing Pulse Width: 1 ms
MDC IDC LEAD IMPLANT DT: 20180410
MDC IDC LEAD IMPLANT DT: 20180410
MDC IDC MSMT BATTERY REMAINING LONGEVITY: 168 mo
MDC IDC MSMT BATTERY VOLTAGE: 3.06 V
MDC IDC MSMT LEADCHNL RA IMPEDANCE VALUE: 323 Ohm
MDC IDC MSMT LEADCHNL RA IMPEDANCE VALUE: 494 Ohm
MDC IDC MSMT LEADCHNL RA PACING THRESHOLD AMPLITUDE: 0.75 V
MDC IDC MSMT LEADCHNL RA SENSING INTR AMPL: 3.125 mV
MDC IDC MSMT LEADCHNL RV PACING THRESHOLD AMPLITUDE: 1.125 V
MDC IDC MSMT LEADCHNL RV PACING THRESHOLD PULSEWIDTH: 0.4 ms
MDC IDC PG IMPLANT DT: 20180410
MDC IDC SESS DTM: 20191008054821
MDC IDC SET LEADCHNL RV SENSING SENSITIVITY: 2 mV
MDC IDC STAT BRADY AS VS PERCENT: 95.52 %

## 2018-01-05 ENCOUNTER — Ambulatory Visit (HOSPITAL_COMMUNITY): Payer: Medicare Other

## 2018-01-08 ENCOUNTER — Ambulatory Visit (HOSPITAL_COMMUNITY)
Admission: RE | Admit: 2018-01-08 | Discharge: 2018-01-08 | Disposition: A | Discharge status: Home / Self Care | Payer: Medicare Other | Source: Ambulatory Visit | Attending: Neurology | Admitting: Neurology

## 2018-01-08 DIAGNOSIS — G459 Transient cerebral ischemic attack, unspecified: Secondary | ICD-10-CM | POA: Insufficient documentation

## 2018-01-08 DIAGNOSIS — R55 Syncope and collapse: Secondary | ICD-10-CM | POA: Diagnosis not present

## 2018-01-08 DIAGNOSIS — R419 Unspecified symptoms and signs involving cognitive functions and awareness: Secondary | ICD-10-CM | POA: Insufficient documentation

## 2018-01-08 DIAGNOSIS — Z950 Presence of cardiac pacemaker: Secondary | ICD-10-CM | POA: Insufficient documentation

## 2018-01-08 LAB — POCT I-STAT CREATININE: CREATININE: 1 mg/dL (ref 0.61–1.24)

## 2018-01-08 MED ORDER — GADOBUTROL 1 MMOL/ML IV SOLN
10.0000 mL | Freq: Once | INTRAVENOUS | Status: AC | PRN
  Administered 2018-01-08: 8 mL via INTRAVENOUS

## 2018-01-10 ENCOUNTER — Telehealth: Payer: Self-pay | Admitting: Neurology

## 2018-01-10 NOTE — Telephone Encounter (Signed)
Called the patient and there was no answer. VM box was full. Called the sister listed on the DPR with the approval to leave a message with. She answered. Reviewed the MRI results with her. She verbalized understanding and states that she will pass this information along to the patient. Informed her to advise him if he had questions to contact us otherwise we will see the pt next tues for scheduled EEG.

## 2018-01-10 NOTE — Telephone Encounter (Signed)
-----   Message from Anson Fret, MD sent at 01/09/2018  4:45 PM EST ----- MRI of the brain shows no acute event, no stroke, no changes from 10/2017. Normal for age

## 2018-01-16 ENCOUNTER — Ambulatory Visit (INDEPENDENT_AMBULATORY_CARE_PROVIDER_SITE_OTHER): Payer: Medicare Other | Admitting: Neurology

## 2018-01-16 DIAGNOSIS — R419 Unspecified symptoms and signs involving cognitive functions and awareness: Secondary | ICD-10-CM

## 2018-01-16 DIAGNOSIS — R55 Syncope and collapse: Secondary | ICD-10-CM | POA: Diagnosis not present

## 2018-01-16 DIAGNOSIS — G459 Transient cerebral ischemic attack, unspecified: Secondary | ICD-10-CM

## 2018-01-22 ENCOUNTER — Telehealth: Payer: Self-pay | Admitting: *Deleted

## 2018-01-22 NOTE — Procedures (Signed)
   HISTORY: 67 year old male with history of paroxysmal atrial fibrillation on chronic Coumadin, lung mass, presented with multiple passing out episode.   TECHNIQUE:  This is a routine 16 channel EEG recording with one channel devoted to a limited EKG recording.  It was performed during wakefulness, drowsiness and asleep.  Hyperventilation and photic stimulation were performed as activating procedures.  There are minimum muscle and movement artifact noted.  Upon maximum arousal, posterior dominant waking rhythm consistent of her frequency of 9 Hz hertz. Activities are symmetric over the bilateral posterior derivations and attenuated with eye opening.  Hyperventilation produced mild/moderate buildup with higher amplitude and the slower activities noted.  Photic stimulation did not alter the tracing.  During EEG recording, patient developed drowsiness but no deeper stage of sleep was achieved.  During EEG recording, there was no epileptiform discharge noted.  EKG demonstrate sinus rhythm, with heart rate of 28 Hz/min  CONCLUSION: This is a  normal awake EEG.  There is no electrodiagnostic evidence of epileptiform discharge.  Levert FeinsteinYijun Jamiee Milholland, M.D. Ph.D.  [facility] [address] [place], [place] [address] Phone: [phone] Fax:      [phone]

## 2018-01-22 NOTE — Telephone Encounter (Signed)
-----   Message from Antonia B Ahern, MD sent at 01/22/2018  3:32 PM EST ----- EEG normal thanks 

## 2018-01-22 NOTE — Telephone Encounter (Signed)
Tried to call pt. Received no answer and vm box was not setup.

## 2018-01-24 ENCOUNTER — Telehealth: Payer: Self-pay | Admitting: *Deleted

## 2018-01-24 NOTE — Telephone Encounter (Signed)
-----   Message from Anson FretAntonia B Ahern, MD sent at 01/22/2018  3:32 PM EST ----- EEG normal thanks

## 2018-01-24 NOTE — Telephone Encounter (Signed)
Spoke to patient at his work # (ok to call per [facility]).  He is aware of his EEG results.

## 2018-01-26 ENCOUNTER — Ambulatory Visit (INDEPENDENT_AMBULATORY_CARE_PROVIDER_SITE_OTHER): Payer: Medicare Other | Admitting: *Deleted

## 2018-01-26 DIAGNOSIS — Z5181 Encounter for therapeutic drug level monitoring: Secondary | ICD-10-CM

## 2018-01-26 DIAGNOSIS — I480 Paroxysmal atrial fibrillation: Secondary | ICD-10-CM | POA: Diagnosis not present

## 2018-01-26 LAB — POCT INR: INR: 1.6 — AB (ref 2.0–3.0)

## 2018-01-26 NOTE — Patient Instructions (Signed)
Description   Today take 1.5 tablets, then Continue taking 1/2 tablet daily except 1 tablet on Mondays and Fridays.  Continue greens 4 times per week. Recheck INR 2 weeks. Call Coumadin clinic with new medications #[phone]

## 2018-02-12 ENCOUNTER — Ambulatory Visit (INDEPENDENT_AMBULATORY_CARE_PROVIDER_SITE_OTHER): Payer: Medicare Other | Admitting: *Deleted

## 2018-02-12 DIAGNOSIS — Z5181 Encounter for therapeutic drug level monitoring: Secondary | ICD-10-CM | POA: Diagnosis not present

## 2018-02-12 DIAGNOSIS — I480 Paroxysmal atrial fibrillation: Secondary | ICD-10-CM | POA: Diagnosis not present

## 2018-02-12 LAB — POCT INR: INR: 2.2 (ref 2.0–3.0)

## 2018-02-12 NOTE — Patient Instructions (Signed)
Description   Continue taking 1/2 tablet daily except 1 tablet on Mondays and Fridays.  Recheck INR 4 weeks. Call Coumadin clinic with new medications #[phone]     

## 2018-03-04 ENCOUNTER — Observation Stay (HOSPITAL_COMMUNITY)
Admission: EM | Admit: 2018-03-04 | Discharge: 2018-03-05 | Disposition: A | Discharge status: Home / Self Care | Payer: Medicare Other | Attending: Internal Medicine | Admitting: Internal Medicine

## 2018-03-04 ENCOUNTER — Observation Stay (HOSPITAL_COMMUNITY): Payer: Medicare Other

## 2018-03-04 ENCOUNTER — Emergency Department (HOSPITAL_COMMUNITY): Payer: Medicare Other

## 2018-03-04 ENCOUNTER — Encounter (HOSPITAL_COMMUNITY): Payer: Self-pay | Admitting: Radiology

## 2018-03-04 ENCOUNTER — Other Ambulatory Visit: Payer: Self-pay

## 2018-03-04 DIAGNOSIS — G40909 Epilepsy, unspecified, not intractable, without status epilepticus: Secondary | ICD-10-CM | POA: Diagnosis not present

## 2018-03-04 DIAGNOSIS — Z7901 Long term (current) use of anticoagulants: Secondary | ICD-10-CM | POA: Diagnosis not present

## 2018-03-04 DIAGNOSIS — R253 Fasciculation: Secondary | ICD-10-CM | POA: Diagnosis present

## 2018-03-04 DIAGNOSIS — Z950 Presence of cardiac pacemaker: Secondary | ICD-10-CM | POA: Diagnosis not present

## 2018-03-04 DIAGNOSIS — R4701 Aphasia: Secondary | ICD-10-CM | POA: Diagnosis not present

## 2018-03-04 DIAGNOSIS — R911 Solitary pulmonary nodule: Secondary | ICD-10-CM | POA: Insufficient documentation

## 2018-03-04 DIAGNOSIS — I495 Sick sinus syndrome: Secondary | ICD-10-CM | POA: Insufficient documentation

## 2018-03-04 DIAGNOSIS — R569 Unspecified convulsions: Principal | ICD-10-CM | POA: Insufficient documentation

## 2018-03-04 DIAGNOSIS — R531 Weakness: Secondary | ICD-10-CM | POA: Diagnosis not present

## 2018-03-04 DIAGNOSIS — I10 Essential (primary) hypertension: Secondary | ICD-10-CM | POA: Insufficient documentation

## 2018-03-04 DIAGNOSIS — J439 Emphysema, unspecified: Secondary | ICD-10-CM | POA: Diagnosis not present

## 2018-03-04 DIAGNOSIS — R410 Disorientation, unspecified: Secondary | ICD-10-CM | POA: Diagnosis not present

## 2018-03-04 DIAGNOSIS — R05 Cough: Secondary | ICD-10-CM | POA: Diagnosis not present

## 2018-03-04 DIAGNOSIS — R29818 Other symptoms and signs involving the nervous system: Secondary | ICD-10-CM | POA: Diagnosis not present

## 2018-03-04 DIAGNOSIS — R29810 Facial weakness: Secondary | ICD-10-CM | POA: Diagnosis not present

## 2018-03-04 DIAGNOSIS — F17210 Nicotine dependence, cigarettes, uncomplicated: Secondary | ICD-10-CM | POA: Insufficient documentation

## 2018-03-04 DIAGNOSIS — R0902 Hypoxemia: Secondary | ICD-10-CM | POA: Diagnosis not present

## 2018-03-04 DIAGNOSIS — I480 Paroxysmal atrial fibrillation: Secondary | ICD-10-CM | POA: Diagnosis not present

## 2018-03-04 DIAGNOSIS — R404 Transient alteration of awareness: Secondary | ICD-10-CM | POA: Diagnosis not present

## 2018-03-04 DIAGNOSIS — Z720 Tobacco use: Secondary | ICD-10-CM | POA: Diagnosis present

## 2018-03-04 DIAGNOSIS — T17908A Unspecified foreign body in respiratory tract, part unspecified causing other injury, initial encounter: Secondary | ICD-10-CM

## 2018-03-04 LAB — COMPREHENSIVE METABOLIC PANEL
ALT: 8 U/L (ref 0–44)
AST: 25 U/L (ref 15–41)
Albumin: 3.3 g/dL — ABNORMAL LOW (ref 3.5–5.0)
Alkaline Phosphatase: 77 U/L (ref 38–126)
Anion gap: 10 (ref 5–15)
BUN: 12 mg/dL (ref 8–23)
CO2: 21 mmol/L — ABNORMAL LOW (ref 22–32)
Calcium: 8.7 mg/dL — ABNORMAL LOW (ref 8.9–10.3)
Chloride: 107 mmol/L (ref 98–111)
Creatinine, Ser: 1.1 mg/dL (ref 0.61–1.24)
GFR calc Af Amer: >60 mL/min (ref >60)
GFR calc non Af Amer: >60 mL/min (ref >60)
Glucose, Bld: 107 mg/dL — ABNORMAL HIGH (ref 70–99)
Potassium: 4.5 mmol/L (ref 3.5–5.1)
Sodium: 138 mmol/L (ref 135–145)
TOTAL PROTEIN: 6 g/dL — AB (ref 6.5–8.1)
Total Bilirubin: 0.7 mg/dL (ref 0.3–1.2)

## 2018-03-04 LAB — I-STAT TROPONIN, ED: Troponin i, poc: 0 ng/mL (ref 0.00–0.08)

## 2018-03-04 LAB — CBC
HCT: 42 % (ref 39.0–52.0)
Hemoglobin: 13.7 g/dL (ref 13.0–17.0)
MCH: 32.9 pg (ref 26.0–34.0)
MCHC: 32.6 g/dL (ref 30.0–36.0)
MCV: 100.7 fL — AB (ref 80.0–100.0)
Platelets: 172 10*3/uL (ref 150–400)
RBC: 4.17 MIL/uL — ABNORMAL LOW (ref 4.22–5.81)
RDW: 12.7 % (ref 11.5–15.5)
WBC: 5.4 10*3/uL (ref 4.0–10.5)
nRBC: 0 % (ref 0.0–0.2)

## 2018-03-04 LAB — I-STAT CHEM 8, ED
BUN: 13 mg/dL (ref 8–23)
CREATININE: 1 mg/dL (ref 0.61–1.24)
Calcium, Ion: 1.2 mmol/L (ref 1.15–1.40)
Chloride: 104 mmol/L (ref 98–111)
Glucose, Bld: 102 mg/dL — ABNORMAL HIGH (ref 70–99)
HCT: 41 % (ref 39.0–52.0)
Hemoglobin: 13.9 g/dL (ref 13.0–17.0)
Potassium: 4.5 mmol/L (ref 3.5–5.1)
Sodium: 139 mmol/L (ref 135–145)
TCO2: 21 mmol/L — ABNORMAL LOW (ref 22–32)

## 2018-03-04 LAB — HEMOGLOBIN A1C
Hgb A1c MFr Bld: 4.6 % — ABNORMAL LOW (ref 4.8–5.6)
Mean Plasma Glucose: 85.32 mg/dL

## 2018-03-04 LAB — DIFFERENTIAL
Abs Immature Granulocytes: 0.02 10*3/uL (ref 0.00–0.07)
Basophils Absolute: 0 10*3/uL (ref 0.0–0.1)
Basophils Relative: 0 %
Eosinophils Absolute: 0 10*3/uL (ref 0.0–0.5)
Eosinophils Relative: 0 %
Immature Granulocytes: 0 %
LYMPHS ABS: 0.6 10*3/uL — AB (ref 0.7–4.0)
Lymphocytes Relative: 11 %
Monocytes Absolute: 0.3 10*3/uL (ref 0.1–1.0)
Monocytes Relative: 6 %
Neutro Abs: 4.5 10*3/uL (ref 1.7–7.7)
Neutrophils Relative %: 83 %

## 2018-03-04 LAB — PROTIME-INR
INR: 1.85
Prothrombin Time: 21.1 seconds — ABNORMAL HIGH (ref 11.4–15.2)

## 2018-03-04 LAB — LIPID PANEL
CHOL/HDL RATIO: 3.9 ratio
Cholesterol: 122 mg/dL (ref 0–200)
HDL: 31 mg/dL — ABNORMAL LOW (ref >40)
LDL Cholesterol: 87 mg/dL (ref 0–99)
Triglycerides: 22 mg/dL (ref <150)
VLDL: 4 mg/dL (ref 0–40)

## 2018-03-04 LAB — CBG MONITORING, ED: Glucose-Capillary: 99 mg/dL (ref 70–99)

## 2018-03-04 LAB — APTT: aPTT: 32 seconds (ref 24–36)

## 2018-03-04 MED ORDER — LORAZEPAM 2 MG/ML IJ SOLN
INTRAMUSCULAR | Status: AC
  Administered 2018-03-04: 1 mg
  Filled 2018-03-04: qty 1

## 2018-03-04 MED ORDER — ONDANSETRON HCL 4 MG/2ML IJ SOLN: 4.0000 mg | Freq: Four times a day (QID) | INTRAMUSCULAR | Status: DC | PRN

## 2018-03-04 MED ORDER — TRAZODONE HCL 50 MG PO TABS: 25.0000 mg | ORAL_TABLET | Freq: Every evening | ORAL | Status: DC | PRN

## 2018-03-04 MED ORDER — PHENOL 1.4 % MT LIQD
1.0000 | OROMUCOSAL | Status: DC | PRN
  Administered 2018-03-04: 1 via OROMUCOSAL
  Filled 2018-03-04: qty 177

## 2018-03-04 MED ORDER — WARFARIN SODIUM 2.5 MG PO TABS
2.5000 mg | ORAL_TABLET | Freq: Once | ORAL | Status: DC
  Filled 2018-03-04: qty 1

## 2018-03-04 MED ORDER — LORAZEPAM 2 MG/ML IJ SOLN: 4.0000 mg | Freq: Every day | INTRAMUSCULAR | Status: DC | PRN

## 2018-03-04 MED ORDER — ACETAMINOPHEN 325 MG PO TABS: 650.0000 mg | ORAL_TABLET | Freq: Four times a day (QID) | ORAL | Status: DC | PRN

## 2018-03-04 MED ORDER — ONDANSETRON HCL 4 MG PO TABS: 4.0000 mg | ORAL_TABLET | Freq: Four times a day (QID) | ORAL | Status: DC | PRN

## 2018-03-04 MED ORDER — ACETAMINOPHEN 650 MG RE SUPP: 650.0000 mg | Freq: Four times a day (QID) | RECTAL | Status: DC | PRN

## 2018-03-04 MED ORDER — LEVETIRACETAM IN NACL 500 MG/100ML IV SOLN
500.0000 mg | Freq: Two times a day (BID) | INTRAVENOUS | Status: DC
  Administered 2018-03-04 – 2018-03-05 (×2): 500 mg via INTRAVENOUS
  Filled 2018-03-04 (×2): qty 100

## 2018-03-04 MED ORDER — IOPAMIDOL (ISOVUE-370) INJECTION 76%
90.0000 mL | Freq: Once | INTRAVENOUS | Status: AC | PRN
  Administered 2018-03-04: 90 mL via INTRAVENOUS

## 2018-03-04 MED ORDER — PANTOPRAZOLE SODIUM 40 MG IV SOLR
40.0000 mg | Freq: Once | INTRAVENOUS | Status: AC
  Administered 2018-03-04: 40 mg via INTRAVENOUS
  Filled 2018-03-04: qty 40

## 2018-03-04 MED ORDER — LEVETIRACETAM IN NACL 1500 MG/100ML IV SOLN
1500.0000 mg | Freq: Once | INTRAVENOUS | Status: AC
  Administered 2018-03-04: 1500 mg via INTRAVENOUS
  Filled 2018-03-04: qty 100

## 2018-03-04 MED ORDER — SODIUM CHLORIDE 0.9 % IV SOLN
INTRAVENOUS | Status: DC
  Administered 2018-03-04: 21:00:00 via INTRAVENOUS

## 2018-03-04 MED ORDER — LEVETIRACETAM IN NACL 1500 MG/100ML IV SOLN
1500.0000 mg | Freq: Two times a day (BID) | INTRAVENOUS | Status: DC
  Filled 2018-03-04: qty 100

## 2018-03-04 MED ORDER — POLYETHYLENE GLYCOL 3350 17 G PO PACK: 17.0000 g | PACK | Freq: Every day | ORAL | Status: DC | PRN

## 2018-03-04 MED ORDER — LORAZEPAM 2 MG/ML IJ SOLN: 1.0000 mg | Freq: Once | INTRAMUSCULAR | Status: DC | PRN

## 2018-03-04 MED ORDER — ENOXAPARIN SODIUM 40 MG/0.4ML ~~LOC~~ SOLN
40.0000 mg | SUBCUTANEOUS | Status: DC
  Administered 2018-03-04: 40 mg via SUBCUTANEOUS
  Filled 2018-03-04 (×2): qty 0.4

## 2018-03-04 MED ORDER — WARFARIN - PHARMACIST DOSING INPATIENT: Freq: Every day | Status: DC

## 2018-03-04 MED ORDER — LEVETIRACETAM 500 MG PO TABS: 500.0000 mg | ORAL_TABLET | Freq: Two times a day (BID) | ORAL | Status: DC

## 2018-03-04 NOTE — Progress Notes (Signed)
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 Ok for scan on 12/30 when Rep is on campus per physician.

## 2018-03-04 NOTE — Procedures (Signed)
History: 67 year old male with seizures  Sedation: None  Technique: This is a 21 channel routine scalp EEG performed at the bedside with bipolar and monopolar montages arranged in accordance to the international 10/20 system of electrode placement. One channel was dedicated to EKG recording.    Background: The background consists of intermixed alpha and beta activities. There is a well defined posterior dominant rhythm of 8 Hz that attenuates with eye opening. Sleep is recorded with normal appearing structures.   Photic stimulation: Physiologic driving is not performed  EEG Abnormalities: None  Clinical Interpretation: This normal EEG is recorded in the waking and sleep state. There was no seizure or seizure predisposition recorded on this study. Please note that lack of epileptiform activity on EEG does not preclude the possibility of epilepsy.   Patrick SlotMcNeill Lijah Bourque, MD [facility] [phone]  If 7pm- 7am, please page neurology on call as listed in AMION.

## 2018-03-04 NOTE — ED Notes (Signed)
EEG completed.

## 2018-03-04 NOTE — ED Triage Notes (Addendum)
Pt  BIB [facility] as a Code Stroke.  LSN was [id number] this am rep[orted by pt's brother.  PT became confused, experienced right sided facial droop, had a 2-3 minute episode of blank starring although pt was able to track with his eyes. Pt reports no recent illness, no incontinence, and no previous HX of seizures.

## 2018-03-04 NOTE — Consult Note (Addendum)
Referring Physician: EMS    Reason for Consult: Code Stroke  HPI: Patrick Kelly is an 67 y.o. male with minimal known PMH, presents as a code stroke via EMS for right facial droop and intermittent aphasia. CTH neg. CTA wnl, no LVO. CTP shows no deficit. Outside of IV tPA treatment window and with the intermittent aphasia, seizures suspected.He will be admitted for work up.    Date last known well: 03/04/18 Time last known well: 0330  tPA Given: not given d/t outside of time window and diagnostic uncertainty   Past Medical History Past Medical History:  Diagnosis Date  . Abnormal CT of the chest    emphysemetous changes suggestive copd  . Dental caries   . Multiple fractures of ribs   . Pulmonary nodule   . Sinusitis   . Syncope   . Tobacco use     Surgical History Past Surgical History:  Procedure Laterality Date  . PACEMAKER IMPLANT N/A 06/14/2016   Procedure: Pacemaker Implant-Dual chamber;  Surgeon: Marinus MawGregg W Taylor, MD;  [facility];  Service: Cardiovascular;  Laterality: N/A;    Family History  Family History  Problem Relation Age of Onset  . Hypertension Sister   . Seizures Neg Hx     Social History:   reports that he has been smoking cigarettes. He has been smoking about 1.00 pack per day. He has never used smokeless tobacco. He reports that he does not drink alcohol or use drugs.  Allergies:  No Known Allergies  Home Medications:  (Not in a hospital admission)   Hospital Medications . LORazepam        ROS:  History obtained from pt  General ROS: negative for - chills, fatigue, fever, night sweats, weight gain or weight loss Psychological ROS: negative for - behavioral disorder, hallucinations, memory difficulties, mood swings or suicidal ideation Ophthalmic ROS: negative for - blurry vision, double vision, eye pain or loss of vision ENT ROS: negative for - epistaxis, nasal discharge, oral lesions, sore throat, tinnitus or  vertigo Allergy and Immunology ROS: negative for - hives or itchy/watery eyes Hematological and Lymphatic ROS: negative for - bleeding problems, bruising or swollen lymph nodes Endocrine ROS: negative for - galactorrhea, hair pattern changes, polydipsia/polyuria or temperature intolerance Respiratory ROS: negative for - cough, hemoptysis, shortness of breath or wheezing Cardiovascular ROS: negative for - chest pain, dyspnea on exertion, edema or irregular heartbeat Gastrointestinal ROS: negative for - abdominal pain, diarrhea, hematemesis, nausea/vomiting or stool incontinence Genito-Urinary ROS: negative for - dysuria, hematuria, incontinence or urinary frequency/urgency Musculoskeletal ROS: negative for - joint swelling or muscular weakness Neurological ROS: as noted in HPI Dermatological ROS: negative for rash and skin lesion changes   Physical Examination:  There were no vitals filed for this visit.  General - unkept, mod distress. Heart - Regular rate and rhythm - no murmer appreciated Lungs - Clear to auscultation  Abdomen - Soft - non tender Extremities - Distal pulses intact - no edema Skin - Warm and dry   Neurologic Examination: Mental Status: Alert, oriented to name, dob, age. States its "Feb", 20.Marland Kitchen.Marland Kitchen.without firm answer for year. Speech fluent without evidence of aphasia. Able to follow 3 step commands without difficulty initially, but during CT, there was a moment when he stopped responding and unable to speak, he remained alert, but has no understanding of what happened. Cranial Nerves: II: Discs not visualized; Visual fields grossly normal, pupils equal, round, reactive to light III,IV, VI: ptosis not present,  extra-ocular motions intact bilaterally V,VII: face is asymmetric with slight flattened nasolabial fold on right. (this increased during the spell of aphasia).  facial light touch sensation normal bilaterally VIII: hearing normal bilaterally IX,X: gag reflex  present XI: bilateral shoulder shrug XII: midline tongue extension Motor: RUE - 5/5    LUE - 5/5   RLE - 5/5    LLE - 5/5 Tone and bulk:normal tone throughout; no atrophy noted Sensory: Light touch intact throughout, bilaterally Deep Tendon Reflexes: 2+ and symmetric throughout Plantars: Right: downgoing   Left: downgoing Cerebellar: normal finger-to-nose and normal heel-to-shin test Gait: deferred at this time.   NIHSS 1a Level of Conscious:0 1b LOC Questions: 1 1c LOC Commands: 0 2 Best Gaze: 0 3 Visual: 0 4 Facial Palsy: 1 5a Motor Arm - left: 0 5b Motor Arm - Right: 0 6a Motor Leg - Left: 0 6b Motor Leg - Right: 0 7 Limb Ataxia: 0 8 Sensory: 0 9 Best Language: 0 10 Dysarthria:0 11 Extinct. and Inattention:0 TOTAL: 2   LABORATORY STUDIES:  Basic Metabolic Panel: Recent Labs  Lab 03/04/18 0956  NA 139  K 4.5  CL 104  GLUCOSE 102*  BUN 13  CREATININE 1.00    Liver Function Tests: No results for input(s): AST, ALT, ALKPHOS, BILITOT, PROT, ALBUMIN in the last 168 hours. No results for input(s): LIPASE, AMYLASE in the last 168 hours. No results for input(s): AMMONIA in the last 168 hours.  CBC: Recent Labs  Lab 03/04/18 0956  HGB 13.9  HCT 41.0    Cardiac Enzymes: No results for input(s): CKTOTAL, CKMB, CKMBINDEX, TROPONINI in the last 168 hours.  BNP: Invalid input(s): POCBNP  CBG: Recent Labs  Lab 03/04/18 0949  GLUCAP 99    Microbiology:   Coagulation Studies: No results for input(s): LABPROT, INR in the last 72 hours.  Urinalysis: No results for input(s): COLORURINE, LABSPEC, PHURINE, GLUCOSEU, HGBUR, BILIRUBINUR, KETONESUR, PROTEINUR, UROBILINOGEN, NITRITE, LEUKOCYTESUR in the last 168 hours.  Invalid input(s): APPERANCEUR  Lipid Panel:     Component Value Date/Time   CHOL 134 12/27/2017 0815   TRIG 58 12/27/2017 0815   HDL 37 (L) 12/27/2017 0815   CHOLHDL 3.6 12/27/2017 0815   CHOLHDL 4.1 06/11/2016 0210   VLDL 8  06/11/2016 0210   LDLCALC 85 12/27/2017 0815    HgbA1C:  Lab Results  Component Value Date   HGBA1C 4.9 12/27/2017    Urine Drug Screen:      Component Value Date/Time   LABOPIA NONE DETECTED 11/01/2017 2328   COCAINSCRNUR NONE DETECTED 11/01/2017 2328   LABBENZ NONE DETECTED 11/01/2017 2328   AMPHETMU NONE DETECTED 11/01/2017 2328   THCU NONE DETECTED 11/01/2017 2328   LABBARB NONE DETECTED 11/01/2017 2328     Alcohol Level:  No results for input(s): ETH in the last 168 hours.  Miscellaneous labs:  EKG  EKG   IMAGING: Ct Head Code Stroke Wo Contrast  Result Date: 03/04/2018 CLINICAL DATA:  Code stroke.  Right facial weakness and aphasia EXAM: CT HEAD WITHOUT CONTRAST TECHNIQUE: Contiguous axial images were obtained from the base of the skull through the vertex without intravenous contrast. COMPARISON:  Brain MRI 01/08/2018 FINDINGS: Brain: No evidence of acute infarction (left insular ribbon is somewhat difficult to resolve but symmetric to the right), hemorrhage, hydrocephalus, extra-axial collection or mass lesion/mass effect. Vascular: No hyperdense vessel or unexpected calcification. Skull: Normal. Negative for fracture or focal lesion. Sinuses/Orbits: No acute finding. Other: These results were communicated to Dr. Amada JupiterKirkpatrick  at 10:00 amon 12/29/2019by text page via the [facility] messaging system. ASPECTS [facility] Stroke Program Early CT Score) - Ganglionic level infarction (caudate, lentiform nuclei, internal capsule, insula, M1-M3 cortex): 7 - Supraganglionic infarction (M4-M6 cortex): 3 Total score (0-10 with 10 being normal): 10 IMPRESSION: No acute finding. ASPECTS is 10. Electronically Signed   By: Marnee Spring M.D.   On: 03/04/2018 10:01    Assessment: 67 y.o. male who comes as a code stroke with transient aphasia and right facial droop. During [facility], there was a witnessed spell of aphasia with some facial twitching. This lasted only a few min, and resolved. Pt had no  recollection of these events  # Transient aphasia w/facial droop- strong suspician for focal seizure. ddx TIA. CTH neg for stroke. # PAfib- Continue on coumadin. INR pending # SSS- w/pacer 06/14/16. It seems this is compatable # TIA vs syncopal events w/no memory of events- seen in clinic by Dr Lucia Gaskins 10/23. suspicion for seizures at that time as well. Work up was ordered as outpt, but not done yet to my review. # Lung Mass- under surveillance # PVD- chronic decreased LE sensation # Tobacco smoker- counsel when able  Plan:  Stat EEG  Load with Keppra 1500mg  IV now, then PO 500mg  BID PO if able  HgbA1c, fasting lipid panel  MRI brain w/wo for seizures  Telemetry monitoring  Frequent neuro checks  Fall Precautions  DVT prophelaxis  NPO if not responding or during aphasic spell  Attending Neurologist's note to follow  I have seen the patient reviewed the above note.  On my exam he had subtle right facial twitching with speech arrest which lasted for approximately 30 seconds.  He then had a gradual improvement in his speech.  The twitching movements were very subtle involving primarily the area just below the right lip.   I would favor treating this as seizure with Keppra load and will get an EEG.  He will also need an MRI with and without contrast, it appears that his pacemaker is MRI compatible.  Ritta Slot, MD [facility] [phone]  If 7pm- 7am, please page neurology on call as listed in AMION.

## 2018-03-04 NOTE — Progress Notes (Signed)
ANTICOAGULATION CONSULT NOTE   Pharmacy Consult for Warfarin Indication: atrial fibrillation  No Known Allergies Patient Measurements: Height: 5\' 9"  (175.3 cm) Weight: 182 lb 15.7 oz (83 kg) IBW/kg (Calculated) : 70.7 Vital Signs: Temp: 97.9 F (36.6 C) (12/29 1029) Temp Source: Oral (12/29 1029) BP: 120/74 (12/29 1100) Pulse Rate: 87 (12/29 1100) Labs: Recent Labs    03/04/18 0951 03/04/18 0956  HGB 13.7 13.9  HCT 42.0 41.0  PLT 172  --   APTT 32  --   LABPROT 21.1*  --   INR 1.85  --   CREATININE 1.10 1.00   Estimated Creatinine Clearance: 71.7 mL/min (by C-G formula based on SCr of 1 mg/dL).  Medical History: Past Medical History:  Diagnosis Date  . Abnormal CT of the chest    emphysemetous changes suggestive copd  . Dental caries   . Multiple fractures of ribs   . Pulmonary nodule   . Sinusitis   . Syncope   . Tobacco use    Assessment: 67 year old male on chronic Warfarin for atrial fibrillation who presented to ED for possible stroke. INR on admission was sub-therapeutic at 1.85 (prior therapeutic at 2.2 on Anticoag visit on 02/12/18). CBC is within normal limits. Last dose of Warfarin -03/03/18. Head CT negative.   PTA regimen: Warfarin 5mg  on Monday and Friday and 2.5mg  all other days.   Goal of Therapy:  INR 2-3 Monitor platelets by anticoagulation protocol: Yes   Plan:  Warfarin 2.5mg  po x1 tonight. Daily PT/INR When INR >2, stop Lovenox SQ.   Fayne NorrieMillen, Claudina Oliphant Brown 03/04/2018,11:36 AM
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
## 2018-03-04 NOTE — ED Provider Notes (Signed)
[facility] EMERGENCY DEPARTMENT Provider Note   CSN: [id number] Arrival date & time: 03/04/18  6962     History   Chief Complaint No chief complaint on file.   HPI Patrick Kelly is a 67 y.o. male.  67 year old male presents here after awakening this morning with aphasia and right-sided facial twitching.  Last seen normal was approximately 3:30 AM.  Family called EMS and CBG was above 100.  Was called a code stroke initially when person arrived.  Seen by neurology and had seizure-like activity described as partial.  Was treated with Ativan by Dr. Amada Jupiter from neurology.  Also loaded with Keppra.  Head CT without evidence of infarct at this time.  Patient denies any prior history of seizure disorders.  Patient did have an EEG last month due to syncope and that report was reviewed.  Patient denies any chronic use of alcohol.     Past Medical History:  Diagnosis Date  . Abnormal CT of the chest    emphysemetous changes suggestive copd  . Dental caries   . Multiple fractures of ribs   . Pulmonary nodule   . Sinusitis   . Syncope   . Tobacco use     Patient Active Problem List   Diagnosis Date Noted  . Encounter for therapeutic drug monitoring 06/23/2017  . Emphysema lung (HCC) 07/12/2016  . 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 PAF (paroxysmal atrial fibrillation) (HCC)   . Peripheral vascular obstructive disease (HCC)   . Multiple fractures of ribs, right side, initial encounter for closed fracture 06/09/2016  . Sinusitis 06/09/2016  . Pulmonary nodule 06/09/2016  . Syncope 06/09/2016  . Tobacco use   . Abnormal CT of the chest   . Dental caries   . Multiple closed fractures of ribs of right side   . Essential hypertension     Past Surgical History:  Procedure Laterality Date  . PACEMAKER IMPLANT N/A 06/14/2016   Procedure: Pacemaker Implant-Dual chamber;  Surgeon: [name], MD;  Location: [facility] INVASIVE CV LAB;  Service:  Cardiovascular;  Laterality: N/A;        Home Medications    Prior to Admission medications   Medication Sig Start Date End Date Taking? Authorizing Provider  warfarin (COUMADIN) 5 MG tablet Take 1 tablet (5 mg total) by mouth daily. Patient taking differently: Take 2.5-5 mg by mouth daily. Take 2.5 mg daily except Wednesday take 5 mg. 06/16/17   [name], MD    Family History Family History  Problem Relation Age of Onset  . Hypertension Sister   . Seizures Neg Hx     Social History Social History   Tobacco Use  . Smoking status: Current Every Day Smoker    Packs/day: 1.00    Types: Cigarettes  . Smokeless tobacco: Never Used  Substance Use Topics  . Alcohol use: No  . Drug use: No     Allergies   Patient has no known allergies.   Review of Systems Review of Systems  All other systems reviewed and are negative.    Physical Exam Updated Vital Signs There were no vitals taken for this visit.  Physical Exam Vitals signs and nursing note reviewed.  Constitutional:      General: He is not in acute distress.    Appearance: Normal appearance. He is well-developed. He is not toxic-appearing.  HENT:     Head: Normocephalic and atraumatic.  Eyes:     General: Lids are  normal.     Conjunctiva/sclera: Conjunctivae normal.     Pupils: Pupils are equal, round, and reactive to light.  Neck:     Musculoskeletal: Normal range of motion and neck supple.     Thyroid: No thyroid mass.     Trachea: No tracheal deviation.  Cardiovascular:     Rate and Rhythm: Normal rate and regular rhythm.     Heart sounds: Normal heart sounds. No murmur. No gallop.   Pulmonary:     Effort: Pulmonary effort is normal. No respiratory distress.     Breath sounds: Normal breath sounds. No stridor. No decreased breath sounds, wheezing, rhonchi or rales.  Abdominal:     General: Bowel sounds are normal. There is no distension.     Palpations: Abdomen is soft.     Tenderness: There  is no abdominal tenderness. There is no rebound.  Musculoskeletal: Normal range of motion.        General: No tenderness.  Skin:    General: Skin is warm and dry.     Findings: No abrasion or rash.  Neurological:     General: No focal deficit present.     Mental Status: He is alert and oriented to person, place, and time.     GCS: GCS eye subscore is 4. GCS verbal subscore is 5. GCS motor subscore is 6.     Cranial Nerves: No cranial nerve deficit or dysarthria.     Sensory: No sensory deficit.     Motor: No seizure activity.     Comments: No dysarthria or aphasia.  Psychiatric:        Mood and Affect: Affect is flat.        Speech: Speech is delayed.        Behavior: Behavior is slowed.      ED Treatments / Results  Labs (all labs ordered are listed, but only abnormal results are displayed) Labs Reviewed  PROTIME-INR - Abnormal; Notable for the following components:      Result Value   Prothrombin Time 21.1 (*)    All other components within normal limits  CBC - Abnormal; Notable for the following components:   RBC 4.17 (*)    MCV 100.7 (*)    All other components within normal limits  DIFFERENTIAL - Abnormal; Notable for the following components:   Lymphs Abs 0.6 (*)    All other components within normal limits  I-STAT CHEM 8, ED - Abnormal; Notable for the following components:   Glucose, Bld 102 (*)    TCO2 21 (*)    All other components within normal limits  APTT  COMPREHENSIVE METABOLIC PANEL  I-STAT TROPONIN, ED  CBG MONITORING, ED    EKG EKG Interpretation  Date/Time:  Sunday March 04 2018 10:28:05 EST Ventricular Rate:  94 PR Interval:    QRS Duration: 95 QT Interval:  369 QTC Calculation: 462 R Axis:   54 Text Interpretation:  Sinus rhythm Low voltage, extremity leads Confirmed by Lorre NickAllen, Lindell Renfrew ([id number]) on 03/04/2018 10:36:35 AM   Radiology Ct Head Code Stroke Wo Contrast  Result Date: 03/04/2018 CLINICAL DATA:  Code stroke.  Right facial  weakness and aphasia EXAM: CT HEAD WITHOUT CONTRAST TECHNIQUE: Contiguous axial images were obtained from the base of the skull through the vertex without intravenous contrast. COMPARISON:  Brain MRI 01/08/2018 FINDINGS: Brain: No evidence of acute infarction (left insular ribbon is somewhat difficult to resolve but symmetric to the right), hemorrhage, hydrocephalus, extra-axial collection or mass lesion/mass effect.  Vascular: No hyperdense vessel or unexpected calcification. Skull: Normal. Negative for fracture or focal lesion. Sinuses/Orbits: No acute finding. Other: These results were communicated to Dr. Amada JupiterKirkpatrick at 10:00 amon 12/29/2019by text page via the [facility] messaging system. ASPECTS [facility]([place] Stroke Program Early CT Score) - Ganglionic level infarction (caudate, lentiform nuclei, internal capsule, insula, M1-M3 cortex): 7 - Supraganglionic infarction (M4-M6 cortex): 3 Total score (0-10 with 10 being normal): 10 IMPRESSION: No acute finding. ASPECTS is 10. Electronically Signed   By: Marnee SpringJonathon  Watts M.D.   On: 03/04/2018 10:01    Procedures Procedures (including critical care time)  Medications Ordered in ED Medications  levETIRAcetam (KEPPRA) IVPB 1500 mg/ 100 mL premix (has no administration in time range)  levETIRAcetam (KEPPRA) tablet 500 mg (has no administration in time range)  iopamidol (ISOVUE-370) 76 % injection 90 mL (90 mLs Intravenous Contrast Given 03/04/18 0959)  LORazepam (ATIVAN) 2 MG/ML injection (1 mg  Given 03/04/18 1001)     Initial Impression / Assessment and Plan / ED Course  I have reviewed the triage vital signs and the nursing notes.  Pertinent labs & imaging results that were available during my care of the patient were reviewed by me and considered in my medical decision making (see chart for details).     Patient has been seen by neurology and code stroke was canceled.  Throughout this current episode is due to seizures and has been treated with Keppra.   Recommends EEG and admission for further management.  Final Clinical Impressions(s) / ED Diagnoses   Final diagnoses:  None    ED Discharge Orders    None       Lorre NickAllen, Timi Reeser, MD 03/04/18 1036

## 2018-03-04 NOTE — Code Documentation (Signed)
67 year old male presents to [facility] as code stroke which was called in the field.  LSW was 0330 this AM - was found this AM by his brother to be aphasic and right facial droop.  On arrival he is alert - talking - confused but will follow commands - denies pain or SOB - mild facial droop noted - met at the bridge by the ED staff and the stroke team - to CT scan.  Post head CT while examining the patient he was noted to have mild facial twitching and decreased LOC not focusing and not talking.  Dr. Leonel Ramsay at bedside - given 1 mg Ativan per order.  Placed on 2 liters nasal cannula for O2 sats 88% - increased to 96% - resps regular and unlabored.  CTA and CTP done.  Back to ED. Again alert - somewhat delayed answers but appropriate.  [facility] now 0.  Handoff to [employer].

## 2018-03-04 NOTE — Progress Notes (Signed)
EEG complete - results pending 

## 2018-03-05 ENCOUNTER — Observation Stay (HOSPITAL_COMMUNITY): Payer: Medicare Other

## 2018-03-05 DIAGNOSIS — R531 Weakness: Secondary | ICD-10-CM | POA: Diagnosis not present

## 2018-03-05 DIAGNOSIS — R569 Unspecified convulsions: Secondary | ICD-10-CM

## 2018-03-05 DIAGNOSIS — I480 Paroxysmal atrial fibrillation: Secondary | ICD-10-CM | POA: Diagnosis not present

## 2018-03-05 DIAGNOSIS — R4701 Aphasia: Secondary | ICD-10-CM | POA: Diagnosis not present

## 2018-03-05 DIAGNOSIS — Z720 Tobacco use: Secondary | ICD-10-CM | POA: Diagnosis not present

## 2018-03-05 HISTORY — DX: Unspecified convulsions: R56.9

## 2018-03-05 LAB — BASIC METABOLIC PANEL
Anion gap: 9 (ref 5–15)
BUN: 9 mg/dL (ref 8–23)
CO2: 20 mmol/L — ABNORMAL LOW (ref 22–32)
Calcium: 8.3 mg/dL — ABNORMAL LOW (ref 8.9–10.3)
Chloride: 111 mmol/L (ref 98–111)
Creatinine, Ser: 0.93 mg/dL (ref 0.61–1.24)
GFR calc Af Amer: >60 mL/min (ref >60)
GFR calc non Af Amer: >60 mL/min (ref >60)
Glucose, Bld: 88 mg/dL (ref 70–99)
Potassium: 3.7 mmol/L (ref 3.5–5.1)
Sodium: 140 mmol/L (ref 135–145)

## 2018-03-05 LAB — CBC
HCT: 40.3 % (ref 39.0–52.0)
Hemoglobin: 13.5 g/dL (ref 13.0–17.0)
MCH: 34.3 pg — ABNORMAL HIGH (ref 26.0–34.0)
MCHC: 33.5 g/dL (ref 30.0–36.0)
MCV: 102.3 fL — ABNORMAL HIGH (ref 80.0–100.0)
Platelets: 144 10*3/uL — ABNORMAL LOW (ref 150–400)
RBC: 3.94 MIL/uL — ABNORMAL LOW (ref 4.22–5.81)
RDW: 12.8 % (ref 11.5–15.5)
WBC: 4.9 10*3/uL (ref 4.0–10.5)
nRBC: 0 % (ref 0.0–0.2)

## 2018-03-05 LAB — HIV ANTIBODY (ROUTINE TESTING W REFLEX): HIV SCREEN 4TH GENERATION: NONREACTIVE

## 2018-03-05 LAB — PROTIME-INR
INR: 1.93
PROTHROMBIN TIME: 21.8 s — AB (ref 11.4–15.2)

## 2018-03-05 MED ORDER — WARFARIN SODIUM 5 MG PO TABS: 5.0000 mg | ORAL_TABLET | Freq: Every day | ORAL | 3 refills | Status: DC

## 2018-03-05 MED ORDER — GADOBUTROL 1 MMOL/ML IV SOLN
8.5000 mL | Freq: Once | INTRAVENOUS | Status: AC | PRN
  Administered 2018-03-05: 8.5 mL via INTRAVENOUS

## 2018-03-05 MED ORDER — LEVETIRACETAM 500 MG PO TABS: 500.0000 mg | ORAL_TABLET | Freq: Two times a day (BID) | ORAL | 0 refills | Status: DC

## 2018-03-05 NOTE — Progress Notes (Signed)
SLP Cancellation Note  Patient Details Name: Patrick MealyDaniel L Kelly MRN: [id number] DOB: 26-Jan-1951   Cancelled treatment:       Reason Eval/Treat Not Completed: SLP screened, no needs identified, will sign off. Pt passed [facility] swallow screen, tolerating diet.    Patrick Kelly, Patrick NearingBonnie Kelly 03/05/2018, 11:55 AM

## 2018-03-05 NOTE — Care Management Obs Status (Signed)
MEDICARE OBSERVATION STATUS NOTIFICATION   Patient Details  Name: Patrick MealyDaniel L Fitterer MRN: [id number] Date of Birth: 01/09/1951   Medicare Observation Status Notification Given:       Kermit BaloKelli F Skyann Ganim, RN 03/05/2018, 1:33 PM

## 2018-03-05 NOTE — Discharge Instructions (Signed)
Seizure, Adult A seizure is a sudden burst of abnormal electrical activity in the brain. The abnormal activity temporarily interrupts normal brain function, causing a person to experience any of the following:  Involuntary movements.  Changes in awareness or consciousness.  Uncontrollable shaking (convulsions). Seizures usually last from 30 seconds to 2 minutes. They usually do not cause permanent brain damage unless they are prolonged. What are the causes? This condition may be caused by:  Fever.  Low blood sugar.  Medicine.  Illness.  Brain injury.  Brain tumor.  Stroke.  A condition that is passed from parent to child (genetic).  Addiction to a substance (substanceuse disorder) or suddenly stopping the use of a substance (withdrawal). Some people who have a seizure never have another one. People who have repeated seizures have a condition called epilepsy. What are the signs or symptoms? Symptoms of this condition vary greatly from person to person. They include:  Convulsions.  Stiffening of the body.  Involuntary movements of the arms or legs.  Loss of consciousness.  Breathing problems.  Falling suddenly.  Confusion.  Head nodding.  Eye blinking or fluttering.  Lip smacking or tongue biting.  Drooling.  Rapid eye movements.  Grunting.  Loss of bladder control and bowel control.  Staring.  Unresponsiveness. Some people have symptoms right before a seizure happens (aura) and right after a seizure happens.  Symptoms that may occur before a seizure include: ? Fear or anxiety. ? Nausea. ? Feeling like the room is spinning (vertigo). ? A feeling of having seen or heard something before (dj vu). ? Odd tastes or smells. ? Changes in vision, such as seeing flashing lights or spots.  Symptoms that may occur after a seizure include: ? Confusion. ? Sleepiness. ? Headache. ? Weakness on one side of the body. How is this diagnosed? This  condition may be diagnosed based on medical history and physical exam. You may also have other tests, including:  Blood tests.  Electroencephalogram, EEG.  CT scan.  MRI.  Spinal tap (lumbar puncture). How is this treated? Most seizures will stop on their own in under 5 minutes and no treatment is needed. Seizures lasting longer than 5 minutes will usually need treatment. Treatment includes:  Medicines given through IV.  Avoiding known triggers, such as medicines that you take for another condition.  Medicines to treat epilepsy (antiepileptics), if epilepsy caused your seizures.  Surgery to stop seizures, if you have epilepsy that does not respond to medicines. Follow these instructions at home: Medicines  Take over-the-counter and prescription medicines only as told by your health care provider.  Avoid any substances that may prevent your medicine from working properly, such as alcohol. Activity  Do not drive, swim, or do any other activities that would be dangerous if you had another seizure. Wait until your health care provider says it is safe to do them.  If you live in the [place] check with your local DMV (department of motor vehicles) to find out about local driving laws. Each state has specific rules about when you can legally return to driving.  Get enough rest. Lack of sleep can make seizures more likely to occur. Educating others Teach friends and family what to do if you have a seizure. They should:  Lay you on the ground to prevent a fall.  Cushion your head and body.  Loosen any tight clothing around your neck.  Turn you on your side. If vomiting occurs, this helps keep your airway clear.  Not hold you down. Holding you down will not stop the seizure.  Not put anything into your mouth.  Know whether or not you need emergency care.  Stay with you until you recover.  General instructions  Contact your health care provider each time you have a  seizure.  Avoid anything that has ever triggered a seizure for you.  Keep a seizure diary. Record what you remember about each seizure, especially anything that might have triggered the seizure.  Keep all follow-up visits as told by your health care provider. This is important. Contact a health care provider if:  You have another seizure.  You have seizures more often.  Your seizure symptoms change.  You continue to have seizures with treatment.  You have symptoms of an infection or illness. These might increase your risk of having a seizure. Get help right away if:  You have a seizure that: ? Lasts longer than 5 minutes. ? Is different than previous seizures. ? Leaves you unable to speak or use a part of your body. ? Makes it harder to breathe.  You have: ? A seizure after a head injury. ? Multiple seizures in a row. ? Confusion or a severe headache right after a seizure.  You are having seizures more often.  You do not wake up immediately after a seizure.  You injure yourself during a seizure. These symptoms may represent a serious problem that is an emergency. Do not wait to see if the symptoms will go away. Get medical help right away. Call your local emergency services (911 in the [place] Do not drive yourself to the hospital. Summary  Seizures are caused by abnormal electrical activity in the brain. The activity disrupts normal brain function, leading to a change in consciousness, abnormal movements, or convulsions.  There are many causes of seizures including illnesses, medicines, genetic conditions, head injuries, strokes, tumors, substance abuse, or substance withdrawal.  Most seizures will stop on their own in under 5 minutes. Seizures lasting longer than 5 minutes are a medical emergency and require immediate treatment.  There are many medicines that are used to treat seizures. Take over-the-counter and prescription medicines only as told by your health care  provider. This information is not intended to replace advice given to you by your health care provider. Make sure you discuss any questions you have with your health care provider. Document Released: 02/19/2000 Document Revised: 03/30/2017 Document Reviewed: 03/30/2017 Elsevier Interactive Patient Education  2019 [employer]   -Per [employer] [place] DMV statutes, patients with seizures are not allowed to drive until  they have been seizure-free for six months. Use caution when using heavy equipment or power tools. Avoid working on ladders or at heights. Take showers instead of baths. Ensure the water temperature is not too high on the home water heater. Do not go swimming alone. When caring for infants or small children, sit down when holding, feeding, or changing them to minimize risk of injury to the child in the event you have a seizure.   Information on my medicine - Coumadin   (Warfarin)  This medication education was reviewed with me or my healthcare representative as part of my discharge preparation.  The pharmacist that spoke with me during my hospital stay was:  Ulyses SouthwardMinh Vasili Fok, RPH-CPP  Why was Coumadin prescribed for you? Coumadin was prescribed for you because you have a blood clot or a medical condition that can cause an increased risk of forming blood clots. Blood clots can cause  serious health problems by blocking the flow of blood to the heart, lung, or brain. Coumadin can prevent harmful blood clots from forming. As a reminder your indication for Coumadin is:   Stroke Prevention Because Of Atrial Fibrillation  What test will check on my response to Coumadin? While on Coumadin (warfarin) you will need to have an INR test regularly to ensure that your dose is keeping you in the desired range. The INR (international normalized ratio) number is calculated from the result of the laboratory test called prothrombin time (PT).  If an INR APPOINTMENT HAS NOT ALREADY BEEN MADE FOR YOU please  schedule an appointment to have this lab work done by your health care provider within 7 days. Your INR goal is usually a number between:  2 to 3 or your provider may give you a more narrow range like 2-2.5.  Ask your health care provider during an office visit what your goal INR is.  What  do you need to  know  About  COUMADIN? Take Coumadin (warfarin) exactly as prescribed by your healthcare provider about the same time each day.  DO NOT stop taking without talking to the doctor who prescribed the medication.  Stopping without other blood clot prevention medication to take the place of Coumadin may increase your risk of developing a new clot or stroke.  Get refills before you run out.  What do you do if you miss a dose? If you miss a dose, take it as soon as you remember on the same day then continue your regularly scheduled regimen the next day.  Do not take two doses of Coumadin at the same time.  Important Safety Information A possible side effect of Coumadin (Warfarin) is an increased risk of bleeding. You should call your healthcare provider right away if you experience any of the following: ? Bleeding from an injury or your nose that does not stop. ? Unusual colored urine (red or dark brown) or unusual colored stools (red or black). ? Unusual bruising for unknown reasons. ? A serious fall or if you hit your head (even if there is no bleeding).  Some foods or medicines interact with Coumadin (warfarin) and might alter your response to warfarin. To help avoid this: ? Eat a balanced diet, maintaining a consistent amount of Vitamin K. ? Notify your provider about major diet changes you plan to make. ? Avoid alcohol or limit your intake to 1 drink for women and 2 drinks for men per day. (1 drink is 5 oz. wine, 12 oz. beer, or 1.5 oz. liquor.)  Make sure that ANY health care provider who prescribes medication for you knows that you are taking Coumadin (warfarin).  Also make sure the  healthcare provider who is monitoring your Coumadin knows when you have started a new medication including herbals and non-prescription products.  Coumadin (Warfarin)  Major Drug Interactions  Increased Warfarin Effect Decreased Warfarin Effect  Alcohol (large quantities) Antibiotics (esp. Septra/Bactrim, Flagyl, Cipro) Amiodarone (Cordarone) Aspirin (ASA) Cimetidine (Tagamet) Megestrol (Megace) NSAIDs (ibuprofen, naproxen, etc.) Piroxicam (Feldene) Propafenone (Rythmol SR) Propranolol (Inderal) Isoniazid (INH) Posaconazole (Noxafil) Barbiturates (Phenobarbital) Carbamazepine (Tegretol) Chlordiazepoxide (Librium) Cholestyramine (Questran) Griseofulvin Oral Contraceptives Rifampin Sucralfate (Carafate) Vitamin K   Coumadin (Warfarin) Major Herbal Interactions  Increased Warfarin Effect Decreased Warfarin Effect  Garlic Ginseng Ginkgo biloba Coenzyme Q10 Green tea St. Johns wort    Coumadin (Warfarin) FOOD Interactions  Eat a consistent number of servings per week of foods HIGH in Vitamin K (1 serving =  cup)  Collards (cooked, or boiled & drained) Kale (cooked, or boiled & drained) Mustard greens (cooked, or boiled & drained) Parsley *serving size only =  cup Spinach (cooked, or boiled & drained) Swiss chard (cooked, or boiled & drained) Turnip greens (cooked, or boiled & drained)  Eat a consistent number of servings per week of foods MEDIUM-HIGH in Vitamin K (1 serving = 1 cup)  Asparagus (cooked, or boiled & drained) Broccoli (cooked, boiled & drained, or raw & chopped) Brussel sprouts (cooked, or boiled & drained) *serving size only =  cup Lettuce, raw (green leaf, endive, romaine) Spinach, raw Turnip greens, raw & chopped   These websites have more information on Coumadin (warfarin):  FailFactory.se; VeganReport.com.au;

## 2018-03-05 NOTE — Discharge Summary (Addendum)
Physician Discharge Summary  Patrick Kelly ZOX:[id number] DOB: 09-06-1950 DOA: 03/04/2018  PCP: Hoy Register, MD  Admit date: 03/04/2018 Discharge date: 03/05/2018  Admitted From: Home Disposition: Home  Recommendations for Outpatient Follow-up:  1. Follow up with PCP in 1-2 weeks  Outpatient referral to neurology for evaluation in 4-6 weeks.  Per [facility] statutes, patients with seizures are not allowed to drive until  they have been seizure-free for six months. Use caution when using heavy equipment or power tools. Avoid working on ladders or at heights. Take showers instead of baths. Ensure the water temperature is not too high on the home water heater. Do not go swimming alone. When caring for infants or small children, sit down when holding, feeding, or changing them to minimize risk of injury to the child in the event you have a seizure.    Home Health: None Equipment/Devices: None Discharge Condition: Fair CODE STATUS: Full code Diet recommendation: Regular    Discharge Diagnoses:  Principal Problem:   Seizure (HCC)  Active Problems:   Tobacco use   PAF (paroxysmal atrial fibrillation) (HCC)   Emphysema lung (HCC)  Brief narrative/HPI 67 year old male with tobacco use, paroxysmal A. fib, peripheral vascular disease, emphysema, sick sinus syndrome and presyncopal events for which he was seen by Dr. Daisy Blossom (neurology) few months back was brought to the hospital by his family after being found unresponsive.  Patient does not remember the event.  Reports that he was evaluated for seizures in the past and was negative.  He was initially presented as code stroke by EMS with right facial droop and intermittent aphasia.  Head CT and CT angiogram unremarkable.  His presentation was suspicious for seizures. Patient placed on observation and neurology consulted.   Hospital course Acute seizure activity Mental status back to baseline.  No focal  deficit.  No further seizure activity.  Clinical presentation highly suspicious for seizures.  MRI brain negative for acute findings.  Neurology recommends Keppra 500 mg twice daily and he will be discharged on this. Follow-up with Dr. Clifton Custard as outpatient. Patient has been provided with instructions on not to drive until seizure-free for 6 months, avoid operating heavy machinery, working on ladders or at heights, not involving water activity or babysitting.  (See instructions above).  Paroxysmal A. fib Rate controlled.  On Coumadin, INR was subtherapeutic.  Dose adjusted while inpatient.  Tobacco abuse Counseled on cessation.  Pulmonary nodule   Follow as outpatient.   Sick sinus syndrome History of pacemaker.  Procedure: CT head, CT cerebral perfusion, CT angiogram head and neck, MRI brain, EEG  Consult: Neurology  Disposition: Home  Discharge Instructions  Discharge Instructions    Ambulatory referral to Neurology   Complete by:  As directed    An appointment is requested in approximately:4 EEKS     Allergies as of 03/05/2018   No Known Allergies     Medication List    TAKE these medications   levETIRAcetam 500 MG tablet Commonly known as:  KEPPRA Take 1 tablet (500 mg total) by mouth 2 (two) times daily.   warfarin 5 MG tablet Commonly known as:  COUMADIN Take as directed. If you are unsure how to take this medication, talk to your nurse or doctor. Original instructions:  Take 0.5 tablet (2.5 mg total) by mouth daily (tU, TH, SAT, SUN) and 5 mg daily on M,W,F What changed:    how much to take  additional instructions  Follow-up Information    Hoy RegisterNewlin, Enobong, MD Follow up.   Specialty:  Family Medicine Contact information: [address] [place] [address] [phone]          No Known Allergies      Procedures/Studies: Ct Angio Head W Or Wo Contrast  Result Date: 03/04/2018 CLINICAL DATA:  Right-sided weakness and aphasia  EXAM: CT ANGIOGRAPHY HEAD AND NECK CT PERFUSION BRAIN TECHNIQUE: Multidetector CT imaging of the head and neck was performed using the standard protocol during bolus administration of intravenous contrast. Multiplanar CT image reconstructions and MIPs were obtained to evaluate the vascular anatomy. Carotid stenosis measurements (when applicable) are obtained utilizing NASCET criteria, using the distal internal carotid diameter as the denominator. Multiphase CT imaging of the brain was performed following IV bolus contrast injection. Subsequent parametric perfusion maps were calculated using RAPID software. CONTRAST:  90mL ISOVUE-370 IOPAMIDOL (ISOVUE-370) INJECTION 76% COMPARISON:  Head CT earlier today. FINDINGS: CTA NECK FINDINGS Aortic arch: Unremarkable. Right carotid system: No stenosis, atherosclerosis, or beading. Left carotid system: No stenosis, atherosclerosis, or beading. Vertebral arteries: Widely patent proximal subclavians. Both vertebral arteries are smooth and widely patent. Skeleton: No acute or aggressive finding. Remote left orbital rim repair Other neck: No gross mass or inflammation Upper chest: Centrilobular emphysema. 5 mm pulmonary nodule in the right upper lobe, known from 06/23/2017 chest CT. Review of the MIP images confirms the above findings CTA HEAD FINDINGS Anterior circulation: No branch occlusion, stenosis, atheromatous change, or aneurysm. Posterior circulation: Codominant vertebral arteries. The vertebral and basilar arteries are smooth and widely patent. No branch occlusion, beading, aneurysm, or flow limiting stenosis Venous sinuses: Not assessed due to contrast timing Anatomic variants: None significant Delayed phase: Not obtained in the emergent setting Review of the MIP images confirms the above findings CT Brain Perfusion Findings: CBF (<30%) Volume: 0mL Perfusion (Tmax>6.0s) volume: 0mL IMPRESSION: 1. Negative CTA of the head and neck. No infarct or ischemia by CT perfusion.  2. Emphysema Electronically Signed   By: Marnee SpringJonathon  Watts M.D.   On: 03/04/2018 10:30   Ct Angio Neck W Or Wo Contrast  Result Date: 03/04/2018 CLINICAL DATA:  Right-sided weakness and aphasia EXAM: CT ANGIOGRAPHY HEAD AND NECK CT PERFUSION BRAIN TECHNIQUE: Multidetector CT imaging of the head and neck was performed using the standard protocol during bolus administration of intravenous contrast. Multiplanar CT image reconstructions and MIPs were obtained to evaluate the vascular anatomy. Carotid stenosis measurements (when applicable) are obtained utilizing NASCET criteria, using the distal internal carotid diameter as the denominator. Multiphase CT imaging of the brain was performed following IV bolus contrast injection. Subsequent parametric perfusion maps were calculated using RAPID software. CONTRAST:  90mL ISOVUE-370 IOPAMIDOL (ISOVUE-370) INJECTION 76% COMPARISON:  Head CT earlier today. FINDINGS: CTA NECK FINDINGS Aortic arch: Unremarkable. Right carotid system: No stenosis, atherosclerosis, or beading. Left carotid system: No stenosis, atherosclerosis, or beading. Vertebral arteries: Widely patent proximal subclavians. Both vertebral arteries are smooth and widely patent. Skeleton: No acute or aggressive finding. Remote left orbital rim repair Other neck: No gross mass or inflammation Upper chest: Centrilobular emphysema. 5 mm pulmonary nodule in the right upper lobe, known from 06/23/2017 chest CT. Review of the MIP images confirms the above findings CTA HEAD FINDINGS Anterior circulation: No branch occlusion, stenosis, atheromatous change, or aneurysm. Posterior circulation: Codominant vertebral arteries. The vertebral and basilar arteries are smooth and widely patent. No branch occlusion, beading, aneurysm, or flow limiting stenosis Venous sinuses: Not assessed due to contrast timing Anatomic  variants: None significant Delayed phase: Not obtained in the emergent setting Review of the MIP images  confirms the above findings CT Brain Perfusion Findings: CBF (<30%) Volume: 0mL Perfusion (Tmax>6.0s) volume: 0mL IMPRESSION: 1. Negative CTA of the head and neck. No infarct or ischemia by CT perfusion. 2. Emphysema Electronically Signed   By: Marnee SpringJonathon  Watts M.D.   On: 03/04/2018 10:30   Mr Laqueta JeanBrain W ZOWo Contrast  Result Date: 03/05/2018 CLINICAL DATA:  New seizure EXAM: MRI HEAD WITHOUT AND WITH CONTRAST TECHNIQUE: Multiplanar, multiecho pulse sequences of the brain and surrounding structures were obtained without and with intravenous contrast. CONTRAST:  8.5 cc Gadavist intravenous COMPARISON:  01/08/2018 FINDINGS: Brain: No acute infarction, hemorrhage, hydrocephalus, extra-axial collection or mass lesion. No cortical finding to explain seizure. The hippocampus has a symmetric normal appearance. FLAIR hyperintensities in the cerebral white matter, thinly confluent around the lateral ventricles, nonspecific but likely from chronic small vessel ischemia. Vascular: Major flow voids and vascular enhancements are preserved Skull and upper cervical spine: Negative for marrow lesion Sinuses/Orbits: Mild generalized mucosal thickening. The maxillary sinuses are small/atelectatic. Prior left orbital rim repair with metallic artifact. IMPRESSION: 1. No acute finding or explanation for seizure. 2. Mild chronic small vessel ischemia. Electronically Signed   By: Marnee SpringJonathon  Watts M.D.   On: 03/05/2018 12:19   Ct Cerebral Perfusion W Contrast  Result Date: 03/04/2018 CLINICAL DATA:  Right-sided weakness and aphasia EXAM: CT ANGIOGRAPHY HEAD AND NECK CT PERFUSION BRAIN TECHNIQUE: Multidetector CT imaging of the head and neck was performed using the standard protocol during bolus administration of intravenous contrast. Multiplanar CT image reconstructions and MIPs were obtained to evaluate the vascular anatomy. Carotid stenosis measurements (when applicable) are obtained utilizing NASCET criteria, using the distal  internal carotid diameter as the denominator. Multiphase CT imaging of the brain was performed following IV bolus contrast injection. Subsequent parametric perfusion maps were calculated using RAPID software. CONTRAST:  90mL ISOVUE-370 IOPAMIDOL (ISOVUE-370) INJECTION 76% COMPARISON:  Head CT earlier today. FINDINGS: CTA NECK FINDINGS Aortic arch: Unremarkable. Right carotid system: No stenosis, atherosclerosis, or beading. Left carotid system: No stenosis, atherosclerosis, or beading. Vertebral arteries: Widely patent proximal subclavians. Both vertebral arteries are smooth and widely patent. Skeleton: No acute or aggressive finding. Remote left orbital rim repair Other neck: No gross mass or inflammation Upper chest: Centrilobular emphysema. 5 mm pulmonary nodule in the right upper lobe, known from 06/23/2017 chest CT. Review of the MIP images confirms the above findings CTA HEAD FINDINGS Anterior circulation: No branch occlusion, stenosis, atheromatous change, or aneurysm. Posterior circulation: Codominant vertebral arteries. The vertebral and basilar arteries are smooth and widely patent. No branch occlusion, beading, aneurysm, or flow limiting stenosis Venous sinuses: Not assessed due to contrast timing Anatomic variants: None significant Delayed phase: Not obtained in the emergent setting Review of the MIP images confirms the above findings CT Brain Perfusion Findings: CBF (<30%) Volume: 0mL Perfusion (Tmax>6.0s) volume: 0mL IMPRESSION: 1. Negative CTA of the head and neck. No infarct or ischemia by CT perfusion. 2. Emphysema Electronically Signed   By: Marnee SpringJonathon  Watts M.D.   On: 03/04/2018 10:30   Dg Chest Port 1 View  Result Date: 03/04/2018 CLINICAL DATA:  Aspiration, cough EXAM: PORTABLE CHEST 1 VIEW COMPARISON:  11/01/2017 chest radiograph. FINDINGS: Stable configuration of 2 lead left subclavian pacemaker. Stable cardiomediastinal silhouette with top-normal heart size. No pneumothorax. No pleural  effusion. Stable mild biapical pleural-parenchymal scarring. No pulmonary edema. No acute consolidative airspace disease. IMPRESSION: No active disease.  Electronically Signed   By: Delbert Phenix M.D.   On: 03/04/2018 21:03   Ct Head Code Stroke Wo Contrast  Result Date: 03/04/2018 CLINICAL DATA:  Code stroke.  Right facial weakness and aphasia EXAM: CT HEAD WITHOUT CONTRAST TECHNIQUE: Contiguous axial images were obtained from the base of the skull through the vertex without intravenous contrast. COMPARISON:  Brain MRI 01/08/2018 FINDINGS: Brain: No evidence of acute infarction (left insular ribbon is somewhat difficult to resolve but symmetric to the right), hemorrhage, hydrocephalus, extra-axial collection or mass lesion/mass effect. Vascular: No hyperdense vessel or unexpected calcification. Skull: Normal. Negative for fracture or focal lesion. Sinuses/Orbits: No acute finding. Other: These results were communicated to Dr. Amada Jupiter at 10:00 amon 12/29/2019by text page via the [facility] messaging system. ASPECTS [facility] Stroke Program Early CT Score) - Ganglionic level infarction (caudate, lentiform nuclei, internal capsule, insula, M1-M3 cortex): 7 - Supraganglionic infarction (M4-M6 cortex): 3 Total score (0-10 with 10 being normal): 10 IMPRESSION: No acute finding. ASPECTS is 10. Electronically Signed   By: Marnee Spring M.D.   On: 03/04/2018 10:01       Subjective: No overnight events.  No further seizure activity.  Discharge Exam: Vitals:   03/05/18 0404 03/05/18 0755  BP: (!) 107/55 107/74  Pulse: 70 66  Resp: 19 20  Temp: 98.2 F (36.8 C) 97.9 F (36.6 C)  SpO2: 94% 93%   Vitals:   03/04/18 1937 03/04/18 2314 03/05/18 0404 03/05/18 0755  BP: 121/80 116/81 (!) 107/55 107/74  Pulse: 74 66 70 66  Resp: 18 18 19 20   Temp: 97.8 F (36.6 C) 98 F (36.7 C) 98.2 F (36.8 C) 97.9 F (36.6 C)  TempSrc: Oral Oral Oral Oral  SpO2: 96% 95% 94% 93%  Weight:      Height:         General: Not in distress HEENT: No pallor, moist mucosa, supple neck Chest: Clear bilaterally CVS: Normal S1-S2, no murmurs GI: Soft, nondistended, nontender Musculoskeletal: Warm, no edema CNs: Alert and oriented, nonfocal    The results of significant diagnostics from this hospitalization (including imaging, microbiology, ancillary and laboratory) are listed below for reference.     Microbiology: No results found for this or any previous visit (from the past 240 hour(s)).   Labs: BNP (last 3 results) No results for input(s): BNP in the last 8760 hours. Basic Metabolic Panel: Recent Labs  Lab 03/04/18 0951 03/04/18 0956 03/05/18 0526  NA 138 139 140  K 4.5 4.5 3.7  CL 107 104 111  CO2 21*  --  20*  GLUCOSE 107* 102* 88  BUN 12 13 9   CREATININE 1.10 1.00 0.93  CALCIUM 8.7*  --  8.3*   Liver Function Tests: Recent Labs  Lab 03/04/18 0951  AST 25  ALT 8  ALKPHOS 77  BILITOT 0.7  PROT 6.0*  ALBUMIN 3.3*   No results for input(s): LIPASE, AMYLASE in the last 168 hours. No results for input(s): AMMONIA in the last 168 hours. CBC: Recent Labs  Lab 03/04/18 0951 03/04/18 0956 03/05/18 0526  WBC 5.4  --  4.9  NEUTROABS 4.5  --   --   HGB 13.7 13.9 13.5  HCT 42.0 41.0 40.3  MCV 100.7*  --  102.3*  PLT 172  --  144*   Cardiac Enzymes: No results for input(s): CKTOTAL, CKMB, CKMBINDEX, TROPONINI in the last 168 hours. BNP: Invalid input(s): POCBNP CBG: Recent Labs  Lab 03/04/18 0949  GLUCAP 99   D-Dimer  No results for input(s): DDIMER in the last 72 hours. Hgb A1c Recent Labs    03/04/18 1138  HGBA1C 4.6*   Lipid Profile Recent Labs    03/04/18 1138  CHOL 122  HDL 31*  LDLCALC 87  TRIG 22  CHOLHDL 3.9   Thyroid function studies No results for input(s): TSH, T4TOTAL, T3FREE, THYROIDAB in the last 72 hours.  Invalid input(s): FREET3 Anemia work up No results for input(s): VITAMINB12, FOLATE, FERRITIN, TIBC, IRON, RETICCTPCT in the  last 72 hours. Urinalysis    Component Value Date/Time   COLORURINE YELLOW 11/01/2017 2204   APPEARANCEUR CLEAR 11/01/2017 2204   LABSPEC 1.016 11/01/2017 2204   PHURINE 6.0 11/01/2017 2204   GLUCOSEU NEGATIVE 11/01/2017 2204   HGBUR NEGATIVE 11/01/2017 2204   BILIRUBINUR NEGATIVE 11/01/2017 2204   KETONESUR NEGATIVE 11/01/2017 2204   PROTEINUR NEGATIVE 11/01/2017 2204   NITRITE NEGATIVE 11/01/2017 2204   LEUKOCYTESUR NEGATIVE 11/01/2017 2204   Sepsis Labs Invalid input(s): PROCALCITONIN,  WBC,  LACTICIDVEN Microbiology No results found for this or any previous visit (from the past 240 hour(s)).   Time coordinating discharge: <30 MINUTES  SIGNED:   Eddie North, MD  [facility] 03/05/2018, 12:55 PM Pager   If 7PM-7AM, please contact night-coverage [email or web address] Password [email or web address]

## 2018-03-05 NOTE — Progress Notes (Signed)
ANTICOAGULATION CONSULT NOTE   Pharmacy Consult for Warfarin Indication: atrial fibrillation  No Known Allergies Patient Measurements: Height: 5\' 9"  (175.3 cm) Weight: 182 lb 15.7 oz (83 kg) IBW/kg (Calculated) : 70.7 Vital Signs: Temp: 97.9 F (36.6 C) (12/30 0755) Temp Source: Oral (12/30 0755) BP: 107/74 (12/30 0755) Pulse Rate: 66 (12/30 0755) Labs: Recent Labs    03/04/18 0951 03/04/18 0956 03/05/18 0526  HGB 13.7 13.9 13.5  HCT 42.0 41.0 40.3  PLT 172  --  144*  APTT 32  --   --   LABPROT 21.1*  --  21.8*  INR 1.85  --  1.93  CREATININE 1.10 1.00 0.93   Estimated Creatinine Clearance: 77.1 mL/min (by C-G formula based on SCr of 0.93 mg/dL).  Medical History: Past Medical History:  Diagnosis Date  . Abnormal CT of the chest    emphysemetous changes suggestive copd  . Dental caries   . Multiple fractures of ribs   . Pulmonary nodule   . Sinusitis   . Syncope   . Tobacco use    Assessment: 67 year old male on chronic Warfarin for atrial fibrillation who presented to ED for possible stroke. INR on admission was sub-therapeutic at 1.85 (prior therapeutic at 2.2 on Anticoag visit on 02/12/18). CBC is within normal limits. Last dose of Warfarin -03/03/18. Head CT negative.   INR came back at 1.93 today. He will be dc home today.   PTA regimen: Warfarin 5mg  on Monday and Friday and 2.5mg  all other days.   Goal of Therapy:  INR 2-3 Monitor platelets by anticoagulation protocol: Yes   Plan:   Would recommend coumadin 2.5mg  PO qday except 5mg  MWF  Ulyses SouthwardMinh Pham, PharmD, BCIDP, AAHIVP, CPP Infectious Disease Pharmacist 03/05/2018 2:16 PM

## 2018-03-05 NOTE — Progress Notes (Signed)
Patient is discharging home. Sister is here for transport. All discharge paperwork went over with patient and family. All questions and concerns addressed. All belongings sent with patient. Patient to be taken down in wheelchair. Patrick RussellKelsey P Damarrion Mimbs

## 2018-03-05 NOTE — Progress Notes (Signed)
PT Cancellation Note  Patient Details Name: Patrick MealyDaniel L Ganser MRN: [id number] DOB: [birth year]   Cancelled Treatment:    Reason Eval/Treat Not Completed: Patient at procedure or test/unavailable.  Having MRI and will try later to assess mobility.   Ivar DrapeRuth E Genene Kilman 03/05/2018, 12:34 PM   Samul Dadauth Dvante Hands, PT MS Acute Rehab Dept. Number: [facility] [phone] and [facility] [phone]

## 2018-03-05 NOTE — Progress Notes (Addendum)
NEUROLOGY PROGRESS NOTE  Subjective: Currently patient is having no complaints.  States he has not had any further episodes.  Patient still has no recollection of why he was brought to the hospital other than his short of breath.  Currently he is not short of breath and states he feels "back to his normal self".  Exam: Vitals:   03/05/18 0404 03/05/18 0755  BP: (!) 107/55 107/74  Pulse: 70 66  Resp: 19 20  Temp: 98.2 F (36.8 C) 97.9 F (36.6 C)  SpO2: 94% 93%    Physical Exam   HEENT-  Normocephalic, no lesions, without obvious abnormality.  Normal external eye and conjunctiva.   Extremities- Warm, dry and intact Musculoskeletal-no joint tenderness, deformity or swelling Skin-warm and dry, no hyperpigmentation, vitiligo, or suspicious lesions    Neuro:  Mental Status: Alert, oriented, thought content appropriate.  Speech slightly dysarthric however the patient is missing multiple teeth Without evidence of aphasia.  Able to follow 3 step commands without difficulty. Cranial Nerves: II:  Visual fields grossly normal,  III,IV, VI: ptosis not present, extra-ocular motions intact bilaterally pupils equal, round, reactive to light and accommodation V,VII: smile symmetric however at rest I do note that he is a right facial droop.  Again I do question if this is secondary to missing multiple teeth. facial light touch sensation normal bilaterally VIII: hearing normal bilaterally IX,X: uvula rises midline XI: bilateral shoulder shrug XII: midline tongue extension Motor: Right : Upper extremity   5/5    Left:     Upper extremity   5/5  Lower extremity   5/5     Lower extremity   5/5 Tone and bulk:normal tone throughout; no atrophy noted Sensory: Pinprick and light touch intact throughout, bilaterally Deep Tendon Reflexes: 2+ and symmetric throughout Plantars: Right: downgoing   Left: downgoing Cerebellar: normal finger-to-nose,      Medications:  Scheduled: . enoxaparin  (LOVENOX) injection  40 mg Subcutaneous Q24H  . warfarin  2.5 mg Oral ONCE-1800  . Warfarin - Pharmacist Dosing Inpatient   Does not apply q1800   Continuous: . sodium chloride 75 mL/hr at 03/05/18 0000  . levETIRAcetam Stopped (03/04/18 2210)   VHQ:[id number]:acetaminophen **OR** acetaminophen, LORazepam, LORazepam, ondansetron **OR** ondansetron (ZOFRAN) IV, phenol, polyethylene glycol, traZODone  Pertinent Labs/Diagnostics: Spot EEG results: EEG Abnormalities: None  Clinical Interpretation: This normal EEG is recorded in the waking and sleep state. There was no seizure or seizure predisposition recorded on this study. Please note that lack of epileptiform activity on EEG does not preclude the possibility of epilepsy.    Ct Cerebral Perfusion W Contrast  Result Date: 03/04/2018 CLINICAL DATA:   IMPRESSION: 1. Negative CTA of the head and neck. No infarct or ischemia by CT perfusion. 2. Emphysema Electronically Signed   By: Marnee SpringJonathon  Watts M.D.   On: 03/04/2018 10:30   Dg Chest Port 1 View  Result Date: 03/04/2018  IMPRESSION: No active disease. Electronically Signed   By: Delbert PhenixJason A Poff M.D.   On: 03/04/2018 21:03   Ct Head Code Stroke Wo Contrast  Result Date: 03/04/2018 CLINICAL DATA:  Code stroke.IMPRESSION: No acute finding. ASPECTS is 10. Electronically Signed   By: Marnee SpringJonathon  Watts M.D.   On: 03/04/2018 10:01     Assessment: 67 year old male brought to the hospital secondary to witnessed spell of aphasia and some facial twitching.  Patient has had no further episodes.  At this point all diagnostic test including CTA of head and neck/CT perfusion/along with CT of  head.  Impression:  - At this time have a very high concern for seizure activity. Recommendations: -Continue Keppra 500 mg twice daily and when able to take p.o. this may change over to p.o. 500 mg Keppra twice daily - Follow-up with neurology at discharge  Also, Maintain good sleep hygiene. Avoid alcohol.   Felicie MornDavid  Smith PA-C [facility] [phone]   03/05/2018, 9:17 AM  I have seen the patient and reviewed the above note.  He presented with right facial twitching and aphasia to which the patient is amnestic consistent with complex partial seizure activity. He is now back to baseline, no aphasia.  He had several episodes yesterday, that resolved after Ativan.  At this time, I feel this is consistent with partial complex seizures and would favor continuing Keppra 500 mg twice daily.  Patient is unable to drive, operate heavy machinery, perform activities at heights or participate in water activities until release by outpatient physician. This was discussed with the patient who expressed understanding.   Ritta SlotMcNeill Mera Gunkel, MD [facility] [phone]  If 7pm- 7am, please page neurology on call as listed in AMION.

## 2018-03-05 NOTE — Care Management Note (Signed)
Case Management Note  Patient Details  Name: Patrick Kelly MRN: [id number] Date of Birth: Aug 07, 1950  Subjective/Objective:    Pt in with seizure like activity. He is from home alone. He states his neighbor or Health and safety inspectorapartment manager will check on him at home.  DME: none No issues obtaining meds. Pt denies issues with transportation.                Action/Plan: Pt discharging home with self care. Pt has PCP, insurance and transportation home.   Expected Discharge Date:  03/05/18               Expected Discharge Plan:  Home/Self Care  In-House Referral:     Discharge planning Services     Post Acute Care Choice:    Choice offered to:     DME Arranged:    DME Agency:     HH Arranged:    HH Agency:     Status of Service:  Completed, signed off  If discussed at [employer] Length of Stay Meetings, dates discussed:    Additional Comments:  Kermit BaloKelli F Jazel Nimmons, RN 03/05/2018, 1:50 PM

## 2018-03-12 ENCOUNTER — Ambulatory Visit (INDEPENDENT_AMBULATORY_CARE_PROVIDER_SITE_OTHER): Payer: Medicare Other

## 2018-03-12 DIAGNOSIS — Z5181 Encounter for therapeutic drug level monitoring: Secondary | ICD-10-CM

## 2018-03-12 DIAGNOSIS — I480 Paroxysmal atrial fibrillation: Secondary | ICD-10-CM | POA: Diagnosis not present

## 2018-03-12 LAB — POCT INR: INR: 2 (ref 2.0–3.0)

## 2018-03-12 NOTE — Patient Instructions (Signed)
Description   Continue taking 1/2 tablet daily except 1 tablet on Mondays and Fridays.  Recheck INR 4 weeks. Call Coumadin clinic with new medications #[phone]

## 2018-03-13 ENCOUNTER — Ambulatory Visit (INDEPENDENT_AMBULATORY_CARE_PROVIDER_SITE_OTHER): Payer: Medicare Other

## 2018-03-13 DIAGNOSIS — I495 Sick sinus syndrome: Secondary | ICD-10-CM

## 2018-03-14 ENCOUNTER — Encounter: Payer: Self-pay | Admitting: Cardiology

## 2018-03-14 NOTE — Progress Notes (Signed)
Remote pacemaker transmission.   

## 2018-03-15 LAB — CUP PACEART REMOTE DEVICE CHECK
Battery Remaining Longevity: 165 mo
Battery Voltage: 3.04 V
Brady Statistic AP VP Percent: 0 %
Brady Statistic AP VS Percent: 0.68 %
Brady Statistic AS VP Percent: 0.03 %
Brady Statistic AS VS Percent: 99.29 %
Brady Statistic RA Percent Paced: 0.68 %
Brady Statistic RV Percent Paced: 0.03 %
Date Time Interrogation Session: 20200107053014
Implantable Lead Implant Date: 20180410
Implantable Lead Implant Date: 20180410
Implantable Lead Location: 753859
Implantable Lead Location: 753860
Implantable Pulse Generator Implant Date: 20180410
Lead Channel Impedance Value: 418 Ohm
Lead Channel Impedance Value: 475 Ohm
Lead Channel Pacing Threshold Amplitude: 0.625 V
Lead Channel Pacing Threshold Amplitude: 1 V
Lead Channel Pacing Threshold Pulse Width: 0.4 ms
Lead Channel Pacing Threshold Pulse Width: 0.4 ms
Lead Channel Sensing Intrinsic Amplitude: 4.25 mV
Lead Channel Sensing Intrinsic Amplitude: 4.25 mV
Lead Channel Sensing Intrinsic Amplitude: 8.25 mV
Lead Channel Sensing Intrinsic Amplitude: 8.25 mV
Lead Channel Setting Pacing Amplitude: 1.5 V
Lead Channel Setting Pacing Amplitude: 2 V
Lead Channel Setting Pacing Pulse Width: 1 ms
Lead Channel Setting Sensing Sensitivity: 2 mV
MDC IDC MSMT LEADCHNL RA IMPEDANCE VALUE: 323 Ohm
MDC IDC MSMT LEADCHNL RV IMPEDANCE VALUE: 323 Ohm

## 2018-03-19 ENCOUNTER — Telehealth: Payer: Self-pay | Admitting: Neurology

## 2018-03-19 NOTE — Telephone Encounter (Signed)
I think the appointment with Shanda Bumps is fine. But what I would order is a 3-day ambulatory EEG through [facility] since the hospital thought this was a seizure. Please discuss with family and order. This will give [place] time to get the test before he comes back. Continue Keppra for seizure prevention.

## 2018-03-19 NOTE — Telephone Encounter (Signed)
Pt's sister Darel Hong called in pt was admitted to the hosp on 03/04/18 and was discharged on 03/05/18 pt is already scheduled to See jessica on 04/30/17 for a f/u is it ok for pt to be seen on that day or do they need a sooner apt with Dr. Lucia Gaskins per his sister Darel Hong. You can reach judy @ [phone]

## 2018-03-20 ENCOUNTER — Encounter: Payer: Self-pay | Admitting: *Deleted

## 2018-03-20 NOTE — Telephone Encounter (Signed)
Letter written to patient and mailed to pt at address on file.

## 2018-03-20 NOTE — Telephone Encounter (Addendum)
I spoke with Darel Hong and gave her Dr. Trevor Mace message. She stated that she was sure that patient would not be willing to do the EEG but we can try to call him. Sister indicated he is very difficult to reach and he does not have a vm. She said anything at home is really not an option d/t hoarding and he won't leave home for a period of time for this if she were to offer the EEG at her home. Per sister, pt has had no problems since released from hospital and he is taking his medication. She will pass our message along to him when she speaks to him next.  I tried to call the patient and received no answer. His vm box is not setup.

## 2018-04-09 ENCOUNTER — Ambulatory Visit (INDEPENDENT_AMBULATORY_CARE_PROVIDER_SITE_OTHER): Payer: Medicare Other

## 2018-04-09 DIAGNOSIS — Z5181 Encounter for therapeutic drug level monitoring: Secondary | ICD-10-CM | POA: Diagnosis not present

## 2018-04-09 DIAGNOSIS — I480 Paroxysmal atrial fibrillation: Secondary | ICD-10-CM

## 2018-04-09 LAB — POCT INR: INR: 2 (ref 2.0–3.0)

## 2018-04-09 NOTE — Patient Instructions (Signed)
Description   Continue on same dosage 1/2 tablet daily except 1 tablet on Mondays and Fridays.  Recheck INR 4 weeks. Call Coumadin clinic with new medications #[phone]
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
## 2018-04-30 ENCOUNTER — Ambulatory Visit (INDEPENDENT_AMBULATORY_CARE_PROVIDER_SITE_OTHER): Payer: Medicare Other | Admitting: Adult Health

## 2018-04-30 ENCOUNTER — Encounter: Payer: Self-pay | Admitting: Adult Health

## 2018-04-30 VITALS — BP 126/78 | HR 95 | Ht 69.0 in | Wt 181.4 lb

## 2018-04-30 DIAGNOSIS — R569 Unspecified convulsions: Secondary | ICD-10-CM | POA: Diagnosis not present

## 2018-04-30 MED ORDER — LEVETIRACETAM 500 MG PO TABS: 500.0000 mg | ORAL_TABLET | Freq: Two times a day (BID) | ORAL | 3 refills | Status: DC

## 2018-04-30 NOTE — Patient Instructions (Signed)
Continue warfarin daily for secondary stroke prevention  Restart Keppra 500mg  twice a day   Highly recommend no driving due to potential seizure activity for at least 6 months after your last potential seizure   Continue to monitor blood pressure at home  Maintain strict control of hypertension with blood pressure goal below 130/90, diabetes with hemoglobin A1c goal below 6.5% and cholesterol with LDL cholesterol (bad cholesterol) goal below 70 mg/dL. I also advised the patient to eat a healthy diet with plenty of whole grains, cereals, fruits and vegetables, exercise regularly and maintain ideal body weight.  Followup in the future with me in 3 months or call earlier if needed       Thank you for coming to see [place] at [facility]. I hope we have been able to provide you high quality care today.  You may receive a patient satisfaction survey over the next few weeks. We would appreciate your feedback and comments so that we may continue to improve ourselves and the health of our patients.

## 2018-05-02 NOTE — Progress Notes (Signed)
Shanda Bumps, we need to discuss sending a form to [facility], thanks

## 2018-05-02 NOTE — Progress Notes (Signed)
Made any corrections needed, and agree with history, physical, neuro exam,assessment and plan as stated.  Patrick Terrones, MD 

## 2018-05-03 ENCOUNTER — Ambulatory Visit: Payer: Medicare Other | Admitting: Neurology

## 2018-05-08 ENCOUNTER — Ambulatory Visit (INDEPENDENT_AMBULATORY_CARE_PROVIDER_SITE_OTHER): Payer: Medicare Other | Admitting: *Deleted

## 2018-05-08 DIAGNOSIS — Z5181 Encounter for therapeutic drug level monitoring: Secondary | ICD-10-CM | POA: Diagnosis not present

## 2018-05-08 DIAGNOSIS — I480 Paroxysmal atrial fibrillation: Secondary | ICD-10-CM

## 2018-05-08 LAB — POCT INR: INR: 2.3 (ref 2.0–3.0)

## 2018-05-08 NOTE — Patient Instructions (Signed)
Description   Continue on same dosage 1/2 tablet daily except 1 tablet on Mondays and Fridays.  Recheck INR 6 weeks. Call Coumadin clinic with new medications #[phone]

## 2018-06-12 ENCOUNTER — Other Ambulatory Visit: Payer: Self-pay

## 2018-06-12 ENCOUNTER — Encounter: Payer: Medicare Other | Admitting: *Deleted

## 2018-06-13 ENCOUNTER — Telehealth: Payer: Self-pay

## 2018-06-13 NOTE — Telephone Encounter (Signed)
Unable to speak  with patient to remind of missed remote transmission 

## 2018-06-18 ENCOUNTER — Other Ambulatory Visit: Payer: Self-pay

## 2018-06-18 ENCOUNTER — Telehealth: Payer: Self-pay

## 2018-06-18 ENCOUNTER — Ambulatory Visit (INDEPENDENT_AMBULATORY_CARE_PROVIDER_SITE_OTHER): Payer: Medicare Other

## 2018-06-18 ENCOUNTER — Ambulatory Visit
Admission: EM | Admit: 2018-06-18 | Discharge: 2018-06-18 | Disposition: A | Discharge status: Home / Self Care | Payer: Medicare Other | Attending: Family Medicine | Admitting: Family Medicine

## 2018-06-18 DIAGNOSIS — S52591A Other fractures of lower end of right radius, initial encounter for closed fracture: Secondary | ICD-10-CM

## 2018-06-18 DIAGNOSIS — S2220XA Unspecified fracture of sternum, initial encounter for closed fracture: Secondary | ICD-10-CM

## 2018-06-18 DIAGNOSIS — M25531 Pain in right wrist: Secondary | ICD-10-CM | POA: Diagnosis not present
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
## 2018-06-18 NOTE — Telephone Encounter (Signed)

## 2018-06-18 NOTE — Discharge Instructions (Addendum)
You have a fracture of the right wrist and fracture of the sternum.  We have put you in a thumb spica splint to help stabilize the fracture. The sternal fracture will have to heal over time.  This could take months to heal You need to ice the area a few times a day for 10 minutes at a time You need to follow-up with orthopedic for further evaluation and management, call them tomorrow You can take Tylenol for pain For any continued or worsening symptoms to include chest pain, shortness of breath go to the hospital

## 2018-06-19 ENCOUNTER — Ambulatory Visit (INDEPENDENT_AMBULATORY_CARE_PROVIDER_SITE_OTHER): Payer: Medicare Other | Admitting: Pharmacist

## 2018-06-19 DIAGNOSIS — I480 Paroxysmal atrial fibrillation: Secondary | ICD-10-CM | POA: Diagnosis not present

## 2018-06-19 DIAGNOSIS — Z5181 Encounter for therapeutic drug level monitoring: Secondary | ICD-10-CM | POA: Diagnosis not present

## 2018-06-19 LAB — POCT INR: INR: 3.1 — AB (ref 2.0–3.0)
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
## 2018-06-20 ENCOUNTER — Other Ambulatory Visit: Payer: Self-pay

## 2018-06-20 ENCOUNTER — Ambulatory Visit (INDEPENDENT_AMBULATORY_CARE_PROVIDER_SITE_OTHER): Payer: Medicare Other | Admitting: *Deleted

## 2018-06-20 DIAGNOSIS — I480 Paroxysmal atrial fibrillation: Secondary | ICD-10-CM

## 2018-06-20 DIAGNOSIS — I495 Sick sinus syndrome: Secondary | ICD-10-CM

## 2018-06-20 LAB — CUP PACEART REMOTE DEVICE CHECK
Battery Remaining Longevity: 161 mo
Battery Voltage: 3.03 V
Brady Statistic AP VP Percent: 0 %
Brady Statistic AP VS Percent: 1.49 %
Brady Statistic AS VP Percent: 0.03 %
Brady Statistic AS VS Percent: 98.49 %
Brady Statistic RA Percent Paced: 1.46 %
Brady Statistic RV Percent Paced: 0.03 %
Date Time Interrogation Session: 20200415043623
Implantable Lead Implant Date: 20180410
Implantable Lead Implant Date: 20180410
Implantable Lead Location: 753859
Implantable Lead Location: 753860
Implantable Lead Model: 3830
Implantable Lead Model: 5076
Implantable Pulse Generator Implant Date: 20180410
Lead Channel Impedance Value: 304 Ohm
Lead Channel Impedance Value: 323 Ohm
Lead Channel Impedance Value: 418 Ohm
Lead Channel Impedance Value: 456 Ohm
Lead Channel Pacing Threshold Amplitude: 0.5 V
Lead Channel Pacing Threshold Amplitude: 1.125 V
Lead Channel Pacing Threshold Pulse Width: 0.4 ms
Lead Channel Pacing Threshold Pulse Width: 0.4 ms
Lead Channel Sensing Intrinsic Amplitude: 3.75 mV
Lead Channel Sensing Intrinsic Amplitude: 3.75 mV
Lead Channel Sensing Intrinsic Amplitude: 7.875 mV
Lead Channel Sensing Intrinsic Amplitude: 7.875 mV
Lead Channel Setting Pacing Amplitude: 1.5 V
Lead Channel Setting Pacing Amplitude: 2 V
Lead Channel Setting Pacing Pulse Width: 1 ms
Lead Channel Setting Sensing Sensitivity: 2 mV

## 2018-06-27 NOTE — Progress Notes (Signed)
Remote pacemaker transmission.   

## 2018-07-10 ENCOUNTER — Telehealth: Payer: Self-pay | Admitting: Internal Medicine

## 2018-07-10 NOTE — Telephone Encounter (Signed)
New message    Spoke w/ pt and scheduled a doxemity phone visit with Dr. [name] on 05.12.20. Pt preferred number is listed in appt notes.      Virtual Visit Pre-Appointment Phone Call  "(Name), I am calling you today to discuss your upcoming appointment. We are currently trying to limit exposure to the virus that causes COVID-19 by seeing patients at home rather than in the office."  1. "What is the BEST phone number to call the day of the visit?" - include this in appointment notes  2. Do you have or have access to (through a family member/friend) a smartphone with video capability that we can use for your visit?" a. If yes - list this number in appt notes as cell (if different from BEST phone #) and list the appointment type as a VIDEO visit in appointment notes b. If no - list the appointment type as a PHONE visit in appointment notes  3. Confirm consent - "In the setting of the current Covid19 crisis, you are scheduled for a (phone or video) visit with your provider on (date) at (time).  Just as we do with many in-office visits, in order for you to participate in this visit, we must obtain consent.  If you'd like, I can send this to your mychart (if signed up) or email for you to review.  Otherwise, I can obtain your verbal consent now.  All virtual visits are billed to your insurance company just like a normal visit would be.  By agreeing to a virtual visit, we'd like you to understand that the technology does not allow for your provider to perform an examination, and thus may limit your provider's ability to fully assess your condition. If your provider identifies any concerns that need to be evaluated in person, we will make arrangements to do so.  Finally, though the technology is pretty good, we cannot assure that it will always work on either your or our end, and in the setting of a video visit, we may have to convert it to a phone-only visit.  In either situation, we cannot ensure  that we have a secure connection.  Are you willing to proceed?" STAFF: Did the patient verbally acknowledge consent to telehealth visit? Document YES/NO here: YES  4. Advise patient to be prepared - "Two hours prior to your appointment, go ahead and check your blood pressure, pulse, oxygen saturation, and your weight (if you have the equipment to check those) and write them all down. When your visit starts, your provider will ask you for this information. If you have an Apple Watch or Kardia device, please plan to have heart rate information ready on the day of your appointment. Please have a pen and paper handy nearby the day of the visit as well."  5. Give patient instructions for MyChart download to smartphone OR Doximity/Doxy.me as below if video visit (depending on what platform provider is using)  6. Inform patient they will receive a phone call 15 minutes prior to their appointment time (may be from unknown caller ID) so they should be prepared to answer    TELEPHONE CALL NOTE  Patrick Kelly has been deemed a candidate for a follow-up tele-health visit to limit community exposure during the Covid-19 pandemic. I spoke with the patient via phone to ensure availability of phone/video source, confirm preferred email & phone number, and discuss instructions and expectations.  I reminded Patrick Kelly to be prepared with any vital sign  and/or heart rhythm information that could potentially be obtained via home monitoring, at the time of his visit. I reminded Patrick Kelly to expect a phone call prior to his visit.  Patrick Kelly 07/10/2018 9:21 AM   INSTRUCTIONS FOR DOWNLOADING THE MYCHART APP TO SMARTPHONE  - The patient must first make sure to have activated MyChart and know their login information - If Apple, go to [employer] Store and type in MyChart in the search bar and download the app. If Android, ask patient to go to [employer] Play Store and type in TaborMyChart in the search bar and  download the app. The app is free but as with any other app downloads, their phone may require them to verify saved payment information or Apple/Android password.  - The patient will need to then log into the app with their MyChart username and password, and select  as their healthcare provider to link the account. When it is time for your visit, go to the MyChart app, find appointments, and click Begin Video Visit. Be sure to Select Allow for your device to access the Microphone and Camera for your visit. You will then be connected, and your provider will be with you shortly.  **If they have any issues connecting, or need assistance please contact MyChart service desk [phone])**  **If using a computer, in order to ensure the best quality for their visit they will need to use either of the following Internet Browsers: [employer] Edge, or Google Chrome**  IF USING DOXIMITY or DOXY.ME - The patient will receive a link just prior to their visit by text.     FULL LENGTH CONSENT FOR TELE-HEALTH VISIT   I hereby voluntarily request, consent and authorize [facility] and its employed or contracted physicians, physician assistants, nurse practitioners or other licensed health care professionals (the Practitioner), to provide me with telemedicine health care services (the Services") as deemed necessary by the treating Practitioner. I acknowledge and consent to receive the Services by the Practitioner via telemedicine. I understand that the telemedicine visit will involve communicating with the Practitioner through live audiovisual communication technology and the disclosure of certain medical information by electronic transmission. I acknowledge that I have been given the opportunity to request an in-person assessment or other available alternative prior to the telemedicine visit and am voluntarily participating in the telemedicine visit.  I understand that I have the right to  withhold or withdraw my consent to the use of telemedicine in the course of my care at any time, without affecting my right to future care or treatment, and that the Practitioner or I may terminate the telemedicine visit at any time. I understand that I have the right to inspect all information obtained and/or recorded in the course of the telemedicine visit and may receive copies of available information for a reasonable fee.  I understand that some of the potential risks of receiving the Services via telemedicine include:   Delay or interruption in medical evaluation due to technological equipment failure or disruption;  Information transmitted may not be sufficient (e.g. poor resolution of images) to allow for appropriate medical decision making by the Practitioner; and/or   In rare instances, security protocols could fail, causing a breach of personal health information.  Furthermore, I acknowledge that it is my responsibility to provide information about my medical history, conditions and care that is complete and accurate to the best of my ability. I acknowledge that Practitioner's advice, recommendations, and/or decision may be  based on factors not within their control, such as incomplete or inaccurate data provided by me or distortions of diagnostic images or specimens that may result from electronic transmissions. I understand that the practice of medicine is not an exact science and that Practitioner makes no warranties or guarantees regarding treatment outcomes. I acknowledge that I will receive a copy of this consent concurrently upon execution via email to the email address I last provided but may also request a printed copy by calling the office of [facility].    I understand that my insurance will be billed for this visit.   I have read or had this consent read to me.  I understand the contents of this consent, which adequately explains the benefits and risks of the Services being  provided via telemedicine.   I have been provided ample opportunity to ask questions regarding this consent and the Services and have had my questions answered to my satisfaction.  I give my informed consent for the services to be provided through the use of telemedicine in my medical care  By participating in this telemedicine visit I agree to the above.

## 2018-07-17 ENCOUNTER — Other Ambulatory Visit: Payer: Self-pay

## 2018-07-17 ENCOUNTER — Telehealth (INDEPENDENT_AMBULATORY_CARE_PROVIDER_SITE_OTHER): Payer: Medicare Other | Admitting: Internal Medicine

## 2018-07-17 DIAGNOSIS — I480 Paroxysmal atrial fibrillation: Secondary | ICD-10-CM

## 2018-07-17 DIAGNOSIS — I495 Sick sinus syndrome: Secondary | ICD-10-CM

## 2018-07-17 DIAGNOSIS — Z950 Presence of cardiac pacemaker: Secondary | ICD-10-CM

## 2018-07-17 NOTE — Progress Notes (Signed)
Electrophysiology TeleHealth Note   Due to national recommendations of social distancing due to COVID 19, an audio/video telehealth visit is felt to be most appropriate for this patient at this time.  See MyChart message from today for the patient's consent to telehealth for [facility].   Date:  07/17/2018   ID:  Patrick Kelly, DOB [birth year], MRN [id number]  Location: patient's home  Provider location: [address] [place]  Evaluation Performed: Follow-up visit  PCP:  Hoy Register, MD  Cardiologist:  No primary care provider on file.  Electrophysiologist:  Dr [name]  Chief Complaint:  " I work around the clock."  History of Present Illness:    Patrick Kelly is a 68 y.o. male who presents via audio/video conferencing for a telehealth visit today.  Since last being seen in our clinic, the patient reports doing very well.  Today, he denies symptoms of palpitations, chest pain, shortness of breath,  lower extremity edema, dizziness, presyncope, or syncope.  The patient is otherwise without complaint today.  The patient denies symptoms of fevers, chills, cough, or new SOB worrisome for COVID 19.  Past Medical History:  Diagnosis Date  . Abnormal CT of the chest    emphysemetous changes suggestive copd  . Dental caries   . Multiple fractures of ribs   . Pulmonary nodule   . Sinusitis   . Syncope   . Tobacco use     Past Surgical History:  Procedure Laterality Date  . PACEMAKER IMPLANT N/A 06/14/2016   Procedure: Pacemaker Implant-Dual chamber;  Surgeon: [name], MD;  [facility];  Service: Cardiovascular;  Laterality: N/A;    Current Outpatient Medications  Medication Sig Dispense Refill  . levETIRAcetam (KEPPRA) 500 MG tablet Take 1 tablet (500 mg total) by mouth 2 (two) times daily. 60 tablet 3  . warfarin (COUMADIN) 5 MG tablet Take 1 tablet (5 mg total) by mouth daily. TAKE 2.5 MG daily and 5 mg on mondays, wednesdays  and fridays. 90 tablet 3   No current facility-administered medications for this visit.     Allergies:   Patient has no known allergies.   Social History:  The patient  reports that he has been smoking cigarettes. He has been smoking about 1.00 pack per day. He has never used smokeless tobacco. He reports that he does not drink alcohol or use drugs.   Family History:  The patient's family history includes Hypertension in his sister.   ROS:  Please see the history of present illness.   All other systems are personally reviewed and negative.    Exam:    Vital Signs:  There were no vitals taken for this visit.   Labs/Other Tests and Data Reviewed:    Recent Labs: 03/04/2018: ALT 8 03/05/2018: BUN 9; Creatinine, Ser 0.93; Hemoglobin 13.5; Platelets 144; Potassium 3.7; Sodium 140   Wt Readings from Last 3 Encounters:  04/30/18 181 lb 6.4 oz (82.3 kg)  03/04/18 182 lb 15.7 oz (83 kg)  12/27/17 182 lb 12.8 oz (82.9 kg)     Other studies personally reviewed: Additional studies/ records that were reviewed today include: none   Last device remote is reviewed from PaceART PDF dated 06/20/18 which reveals normal device function, atrial tachy   ASSESSMENT & PLAN:    1.  PAF - he is asymptomatic. He will continue systemic anti-coagulation. 2. HTN - his pressure has been controlled. 3. PPM - his medtronic device  is working normally. We will recheck in several months. 4. COVID 19 screen The patient denies symptoms of COVID 19 at this time.  The importance of social distancing was discussed today.  Follow-up:  12 months Next remote: 7/20  Current medicines are reviewed at length with the patient today.   The patient does not have concerns regarding his medicines.  The following changes were made today:  none  Labs/ tests ordered today include: none No orders of the defined types were placed in this encounter.    Patient Risk:  after full review of this patients clinical status,  I feel that they are at moderate risk at this time.  Today, I have spent 15 minutes with the patient with telehealth technology discussing all of the above.    Signed, Lewayne BuntingGregg Taylor, MD  07/17/2018 9:42 AM     [facility] [address] [place] [address] [phone] (office) [phone] (fax)

## 2018-07-17 NOTE — Telephone Encounter (Signed)
New Message   Patient has virtual appt today at 9:45am states he answered but no one was on the phone please call him back.

## 2018-07-18 ENCOUNTER — Telehealth: Payer: Self-pay

## 2018-07-18 NOTE — Telephone Encounter (Signed)
Left vm for patients sister Claris Che  that provider is out of office. Pt will need to be r/s for a video visit. I stated pt can be schedule for video visit next week with DR.Sethi just need to give instructions.

## 2018-07-18 NOTE — Telephone Encounter (Signed)
I tried to call pts listed number no vm set up could not leave message.

## 2018-07-18 NOTE — Telephone Encounter (Signed)
I called pts sister Darel Hong to stated pt appt will need to be r/s due to provider being out. I stated due to COVID 19 we are only doing video visits. She stated her brother had a seizure last week due to not taking his seizure medication for 6 weeks. She stated he had the medication but just forgot. She has help her brother and educated him on taking it daily. Pt r/s for August 2020 for in office based. I stated depending on COVID 19 we will call pt if It has to be telephone visit. She has new appt and check in time.

## 2018-07-27 ENCOUNTER — Telehealth: Payer: Self-pay

## 2018-07-27 NOTE — Telephone Encounter (Signed)
This encounter was created in error - please disregard.

## 2018-07-27 NOTE — Telephone Encounter (Signed)

## 2018-07-27 NOTE — Telephone Encounter (Signed)
Unable to lmom for prescreen no vmsetup 

## 2018-07-27 NOTE — Addendum Note (Signed)
Addended by: Eather Colas on: 07/27/2018 09:53 AM   Modules accepted: Level of Service, SmartSet

## 2018-07-31 ENCOUNTER — Ambulatory Visit: Payer: Medicare Other | Admitting: Adult Health

## 2018-08-02 ENCOUNTER — Telehealth: Payer: Self-pay | Admitting: Adult Health

## 2018-08-02 NOTE — Telephone Encounter (Signed)
Pt sister is asking for a call to discuss which type of Keppra pt should be on( extended release or not) please call(sister is aware the office is not open tomorrow and that she may not be contacted until Monday 06-01)

## 2018-08-06 NOTE — Telephone Encounter (Signed)
IF Patrick Kelly calls pt he was prescribe plain keppra in Feb 2020 by our office. He is not on Extended release.  Left vm for Patrick Kelly to call back with any questions.

## 2018-08-29 ENCOUNTER — Telehealth: Payer: Self-pay

## 2018-08-29 NOTE — Telephone Encounter (Signed)
Unable to lmom for prescreen  

## 2018-09-05 ENCOUNTER — Ambulatory Visit (INDEPENDENT_AMBULATORY_CARE_PROVIDER_SITE_OTHER): Payer: Medicare Other | Admitting: *Deleted

## 2018-09-05 ENCOUNTER — Other Ambulatory Visit: Payer: Self-pay

## 2018-09-05 DIAGNOSIS — Z5181 Encounter for therapeutic drug level monitoring: Secondary | ICD-10-CM

## 2018-09-05 DIAGNOSIS — I480 Paroxysmal atrial fibrillation: Secondary | ICD-10-CM | POA: Diagnosis not present

## 2018-09-05 LAB — POCT INR: INR: 2.5 (ref 2.0–3.0)

## 2018-09-05 NOTE — Patient Instructions (Addendum)
Description    Continue on same dosage 1/2 tablet daily except 1 tablet on Mondays, Wednesdays, Fridays.  Recheck INR 6 weeks. Call Coumadin clinic with new medications [phone]

## 2018-09-19 ENCOUNTER — Ambulatory Visit (INDEPENDENT_AMBULATORY_CARE_PROVIDER_SITE_OTHER): Payer: Medicare Other | Admitting: *Deleted

## 2018-09-19 DIAGNOSIS — I495 Sick sinus syndrome: Secondary | ICD-10-CM

## 2018-09-19 LAB — CUP PACEART REMOTE DEVICE CHECK
Battery Remaining Longevity: 159 mo
Battery Voltage: 3.03 V
Brady Statistic AP VP Percent: 0 %
Brady Statistic AP VS Percent: 4.29 %
Brady Statistic AS VP Percent: 0.03 %
Brady Statistic AS VS Percent: 95.68 %
Brady Statistic RA Percent Paced: 4.27 %
Brady Statistic RV Percent Paced: 0.05 %
Date Time Interrogation Session: 20200715050245
Implantable Lead Implant Date: 20180410
Implantable Lead Implant Date: 20180410
Implantable Lead Location: 753859
Implantable Lead Location: 753860
Implantable Lead Model: 3830
Implantable Lead Model: 5076
Implantable Pulse Generator Implant Date: 20180410
Lead Channel Impedance Value: 304 Ohm
Lead Channel Impedance Value: 323 Ohm
Lead Channel Impedance Value: 513 Ohm
Lead Channel Impedance Value: 513 Ohm
Lead Channel Pacing Threshold Amplitude: 0.625 V
Lead Channel Pacing Threshold Amplitude: 1.5 V
Lead Channel Pacing Threshold Pulse Width: 0.4 ms
Lead Channel Pacing Threshold Pulse Width: 0.4 ms
Lead Channel Sensing Intrinsic Amplitude: 4 mV
Lead Channel Sensing Intrinsic Amplitude: 4 mV
Lead Channel Sensing Intrinsic Amplitude: 7.75 mV
Lead Channel Sensing Intrinsic Amplitude: 7.75 mV
Lead Channel Setting Pacing Amplitude: 1.5 V
Lead Channel Setting Pacing Amplitude: 2 V
Lead Channel Setting Pacing Pulse Width: 1 ms
Lead Channel Setting Sensing Sensitivity: 2 mV

## 2018-09-29 ENCOUNTER — Encounter: Payer: Self-pay | Admitting: Cardiology

## 2018-09-29 NOTE — Progress Notes (Signed)
Remote pacemaker transmission.   

## 2018-10-09 ENCOUNTER — Observation Stay (HOSPITAL_COMMUNITY): Payer: No Typology Code available for payment source

## 2018-10-09 ENCOUNTER — Emergency Department (HOSPITAL_COMMUNITY): Payer: No Typology Code available for payment source

## 2018-10-09 ENCOUNTER — Other Ambulatory Visit: Payer: Self-pay

## 2018-10-09 ENCOUNTER — Encounter (HOSPITAL_COMMUNITY): Payer: Self-pay

## 2018-10-09 ENCOUNTER — Inpatient Hospital Stay (HOSPITAL_COMMUNITY)
Admission: EM | Admit: 2018-10-09 | Discharge: 2018-10-11 | DRG: 101 | Disposition: A | Discharge status: Home / Self Care | Payer: No Typology Code available for payment source | Attending: Internal Medicine | Admitting: Internal Medicine

## 2018-10-09 DIAGNOSIS — Z7901 Long term (current) use of anticoagulants: Secondary | ICD-10-CM

## 2018-10-09 DIAGNOSIS — R404 Transient alteration of awareness: Secondary | ICD-10-CM | POA: Diagnosis not present

## 2018-10-09 DIAGNOSIS — G9340 Encephalopathy, unspecified: Secondary | ICD-10-CM | POA: Diagnosis not present

## 2018-10-09 DIAGNOSIS — R569 Unspecified convulsions: Secondary | ICD-10-CM | POA: Diagnosis not present

## 2018-10-09 DIAGNOSIS — Z20828 Contact with and (suspected) exposure to other viral communicable diseases: Secondary | ICD-10-CM | POA: Diagnosis not present

## 2018-10-09 DIAGNOSIS — Z950 Presence of cardiac pacemaker: Secondary | ICD-10-CM

## 2018-10-09 DIAGNOSIS — R748 Abnormal levels of other serum enzymes: Secondary | ICD-10-CM | POA: Diagnosis present

## 2018-10-09 DIAGNOSIS — G40909 Epilepsy, unspecified, not intractable, without status epilepticus: Principal | ICD-10-CM | POA: Diagnosis present

## 2018-10-09 DIAGNOSIS — D696 Thrombocytopenia, unspecified: Secondary | ICD-10-CM | POA: Diagnosis not present

## 2018-10-09 DIAGNOSIS — R55 Syncope and collapse: Secondary | ICD-10-CM | POA: Diagnosis present

## 2018-10-09 DIAGNOSIS — Z79899 Other long term (current) drug therapy: Secondary | ICD-10-CM

## 2018-10-09 DIAGNOSIS — R791 Abnormal coagulation profile: Secondary | ICD-10-CM | POA: Diagnosis not present

## 2018-10-09 DIAGNOSIS — I480 Paroxysmal atrial fibrillation: Secondary | ICD-10-CM | POA: Diagnosis not present

## 2018-10-09 DIAGNOSIS — J439 Emphysema, unspecified: Secondary | ICD-10-CM | POA: Diagnosis present

## 2018-10-09 DIAGNOSIS — R4182 Altered mental status, unspecified: Secondary | ICD-10-CM | POA: Diagnosis not present

## 2018-10-09 DIAGNOSIS — R0902 Hypoxemia: Secondary | ICD-10-CM | POA: Diagnosis not present

## 2018-10-09 DIAGNOSIS — R4020 Unspecified coma: Secondary | ICD-10-CM | POA: Diagnosis not present

## 2018-10-09 DIAGNOSIS — D72829 Elevated white blood cell count, unspecified: Secondary | ICD-10-CM | POA: Diagnosis present

## 2018-10-09 DIAGNOSIS — Z9114 Patient's other noncompliance with medication regimen: Secondary | ICD-10-CM

## 2018-10-09 DIAGNOSIS — I10 Essential (primary) hypertension: Secondary | ICD-10-CM | POA: Diagnosis present

## 2018-10-09 DIAGNOSIS — Z8249 Family history of ischemic heart disease and other diseases of the circulatory system: Secondary | ICD-10-CM

## 2018-10-09 LAB — COMPREHENSIVE METABOLIC PANEL
ALT: 12 U/L (ref 0–44)
AST: 33 U/L (ref 15–41)
Albumin: 3.3 g/dL — ABNORMAL LOW (ref 3.5–5.0)
Alkaline Phosphatase: 99 U/L (ref 38–126)
Anion gap: 14 (ref 5–15)
BUN: 13 mg/dL (ref 8–23)
CO2: 19 mmol/L — ABNORMAL LOW (ref 22–32)
Calcium: 8.6 mg/dL — ABNORMAL LOW (ref 8.9–10.3)
Chloride: 103 mmol/L (ref 98–111)
Creatinine, Ser: 1.18 mg/dL (ref 0.61–1.24)
GFR calc Af Amer: >60 mL/min (ref >60)
GFR calc non Af Amer: >60 mL/min (ref >60)
Glucose, Bld: 113 mg/dL — ABNORMAL HIGH (ref 70–99)
Potassium: 4 mmol/L (ref 3.5–5.1)
Sodium: 136 mmol/L (ref 135–145)
Total Bilirubin: 0.8 mg/dL (ref 0.3–1.2)
Total Protein: 6.1 g/dL — ABNORMAL LOW (ref 6.5–8.1)

## 2018-10-09 LAB — CBC WITH DIFFERENTIAL/PLATELET
Abs Immature Granulocytes: 0.2 10*3/uL — ABNORMAL HIGH (ref 0.00–0.07)
Basophils Absolute: 0 10*3/uL (ref 0.0–0.1)
Basophils Relative: 0 %
Eosinophils Absolute: 0 10*3/uL (ref 0.0–0.5)
Eosinophils Relative: 0 %
HCT: 42.4 % (ref 39.0–52.0)
Hemoglobin: 14.1 g/dL (ref 13.0–17.0)
Immature Granulocytes: 2 %
Lymphocytes Relative: 5 %
Lymphs Abs: 0.6 10*3/uL — ABNORMAL LOW (ref 0.7–4.0)
MCH: 34.2 pg — ABNORMAL HIGH (ref 26.0–34.0)
MCHC: 33.3 g/dL (ref 30.0–36.0)
MCV: 102.9 fL — ABNORMAL HIGH (ref 80.0–100.0)
Monocytes Absolute: 0.6 10*3/uL (ref 0.1–1.0)
Monocytes Relative: 5 %
Neutro Abs: 10 10*3/uL — ABNORMAL HIGH (ref 1.7–7.7)
Neutrophils Relative %: 88 %
Platelets: 173 10*3/uL (ref 150–400)
RBC: 4.12 MIL/uL — ABNORMAL LOW (ref 4.22–5.81)
RDW: 12.5 % (ref 11.5–15.5)
WBC: 11.4 10*3/uL — ABNORMAL HIGH (ref 4.0–10.5)
nRBC: 0 % (ref 0.0–0.2)

## 2018-10-09 LAB — RAPID URINE DRUG SCREEN, HOSP PERFORMED
Amphetamines: NOT DETECTED
Barbiturates: NOT DETECTED
Benzodiazepines: POSITIVE — AB
Cocaine: NOT DETECTED
Opiates: NOT DETECTED
Tetrahydrocannabinol: NOT DETECTED

## 2018-10-09 LAB — URINALYSIS, ROUTINE W REFLEX MICROSCOPIC
Bilirubin Urine: NEGATIVE
Glucose, UA: NEGATIVE mg/dL
Ketones, ur: NEGATIVE mg/dL
Leukocytes,Ua: NEGATIVE
Nitrite: NEGATIVE
Protein, ur: NEGATIVE mg/dL
Specific Gravity, Urine: 1.019 (ref 1.005–1.030)
pH: 5 (ref 5.0–8.0)

## 2018-10-09 LAB — PROTIME-INR
INR: 3.5 — ABNORMAL HIGH (ref 0.8–1.2)
Prothrombin Time: 34.2 seconds — ABNORMAL HIGH (ref 11.4–15.2)

## 2018-10-09 LAB — SARS CORONAVIRUS 2 (TAT 6-24 HRS): SARS Coronavirus 2: NEGATIVE

## 2018-10-09 LAB — GLUCOSE, CAPILLARY: Glucose-Capillary: 86 mg/dL (ref 70–99)

## 2018-10-09 MED ORDER — LORAZEPAM 2 MG/ML IJ SOLN: 2.0000 mg | INTRAMUSCULAR | Status: DC | PRN

## 2018-10-09 MED ORDER — ENOXAPARIN SODIUM 40 MG/0.4ML ~~LOC~~ SOLN: 40.0000 mg | SUBCUTANEOUS | Status: DC

## 2018-10-09 MED ORDER — LEVETIRACETAM IN NACL 500 MG/100ML IV SOLN
500.0000 mg | Freq: Two times a day (BID) | INTRAVENOUS | Status: DC
  Administered 2018-10-09 – 2018-10-11 (×4): 500 mg via INTRAVENOUS
  Filled 2018-10-09 (×5): qty 100

## 2018-10-09 MED ORDER — LORAZEPAM 2 MG/ML IJ SOLN
1.0000 mg | Freq: Once | INTRAMUSCULAR | Status: AC | PRN
  Administered 2018-10-09: 1 mg via INTRAVENOUS
  Filled 2018-10-09: qty 1

## 2018-10-09 MED ORDER — LEVETIRACETAM 500 MG PO TABS: 500.0000 mg | ORAL_TABLET | Freq: Two times a day (BID) | ORAL | Status: DC

## 2018-10-09 MED ORDER — LACTATED RINGERS IV SOLN
INTRAVENOUS | Status: DC
  Administered 2018-10-09: 18:00:00 via INTRAVENOUS

## 2018-10-09 MED ORDER — ACETAMINOPHEN 650 MG RE SUPP: 650.0000 mg | Freq: Four times a day (QID) | RECTAL | Status: DC | PRN

## 2018-10-09 MED ORDER — LEVETIRACETAM IN NACL 1000 MG/100ML IV SOLN
1000.0000 mg | Freq: Once | INTRAVENOUS | Status: AC
  Administered 2018-10-09: 1000 mg via INTRAVENOUS
  Filled 2018-10-09: qty 100

## 2018-10-09 MED ORDER — LORAZEPAM 2 MG/ML IJ SOLN
2.0000 mg | Freq: Once | INTRAMUSCULAR | Status: AC
  Administered 2018-10-09: 2 mg via INTRAVENOUS
  Filled 2018-10-09: qty 1

## 2018-10-09 MED ORDER — HALOPERIDOL LACTATE 5 MG/ML IJ SOLN
5.0000 mg | Freq: Once | INTRAMUSCULAR | Status: AC
  Administered 2018-10-09: 10:00:00 5 mg via INTRAVENOUS
  Filled 2018-10-09: qty 1

## 2018-10-09 MED ORDER — ACETAMINOPHEN 325 MG PO TABS: 650.0000 mg | ORAL_TABLET | Freq: Four times a day (QID) | ORAL | Status: DC | PRN

## 2018-10-09 MED ORDER — LORAZEPAM 2 MG/ML IJ SOLN
INTRAMUSCULAR | Status: AC
  Administered 2018-10-09: 2 mg via INTRAVENOUS
  Filled 2018-10-09: qty 1

## 2018-10-09 NOTE — ED Notes (Signed)
EEG at bedside.

## 2018-10-09 NOTE — ED Notes (Signed)
Neurology at bedside.
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
## 2018-10-09 NOTE — Procedures (Signed)
Patient Name: Patrick Kelly  MRN: [id number]  Epilepsy Attending: Lora Havens  Referring Physician/Provider: Dr Roland Rack Date: 10/09/18 Duration: 29.04 mins  Patient history: 68Yo M with h/o seizures, presented with breakthrough seizure. EEG ordered to rule out status  Level of alertness: awake, asleep  AEDs during EEG study: LEV  Technical aspects: This EEG study was done with scalp electrodes positioned according to the 10-20 International system of electrode placement. Electrical activity was acquired at a sampling rate of 500Hz  and reviewed with a high frequency filter of 70Hz  and a low frequency filter of 1Hz . EEG data were recorded continuously and digitally stored.   DESCRIPTION:  There is an excessive amount of 15 to 18 Hz, 2-3 uV beta activity with irregular morphology distributed symmetrically and diffusely.  No clear posterior dominant rhythm was noted. During sleep, vertex waves and sleep spindles , maximal frontocentral were seen. Hyperventilation and photic stimulation were not performed.  IMPRESSION: This study is within normal limits. No seizures or epileptiform discharges were seen throughout the recording. The excessive beta activity seen in the background is most likely due to the effect of benzodiazepine.  Patrick Kelly [name]
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
## 2018-10-09 NOTE — Progress Notes (Signed)
LTM started. Tested event button. Pt still in ED; educated nurse on how to move patient to floor.
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
## 2018-10-09 NOTE — Progress Notes (Signed)
ANTICOAGULATION CONSULT NOTE - Initial Consult  Pharmacy Consult for warfarin Indication: atrial fibrillation  No Known Allergies  Patient Measurements: Height: 5\' 9"  (175.3 cm) Weight: 181 lb 7 oz (82.3 kg) IBW/kg (Calculated) : 70.7  Vital Signs: Temp: 97.9 F (36.6 C) (08/04 1233) Temp Source: Rectal (08/04 1233) BP: 131/80 (08/04 1545) Pulse Rate: 67 (08/04 1545)  Labs: Recent Labs    10/09/18 0910  HGB 14.1  HCT 42.4  PLT 173  CREATININE 1.18    Estimated Creatinine Clearance: 59.9 mL/min (by C-G formula based on SCr of 1.18 mg/dL).   Medical History: Past Medical History:  Diagnosis Date  . Abnormal CT of the chest    emphysemetous changes suggestive copd  . Dental caries   . Multiple fractures of ribs   . Pulmonary nodule   . Seizures ([place])   . Sinusitis   . Syncope   . Tobacco use     Medications:  Scheduled:   Assessment: 84 yoM admitted for seizure. On warfarin PTA for Afib. PTA dose is 5 mg on MWF, 2.5 mg on all other days per outpatient clinic note. Unable to confirm with patient due to altered mental status. Pharmacy consulted to dose warfarin. INR on arrival is elevated at 3.5 (goal 2-3). CBC wnl. Hgb 14.1. Plt 173. No signs or symptoms of bleeding noted.  Goal of Therapy:  INR 2-3 Monitor platelets by anticoagulation protocol: Yes   Plan:  Hold warfarin dose x1 Monitor daily PT/INR, CBC, s/sx of bleeding  Berenice Bouton, PharmD PGY1 Pharmacy Resident Office phone: [phone]  10/09/2018,4:33 PM
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
## 2018-10-09 NOTE — ED Notes (Signed)
Contacted pt's sister, Bethena Roys

## 2018-10-09 NOTE — Progress Notes (Signed)
EEG Completed; Results Pending  

## 2018-10-10 DIAGNOSIS — Z79899 Other long term (current) drug therapy: Secondary | ICD-10-CM

## 2018-10-10 DIAGNOSIS — I480 Paroxysmal atrial fibrillation: Secondary | ICD-10-CM | POA: Diagnosis present

## 2018-10-10 DIAGNOSIS — Z8249 Family history of ischemic heart disease and other diseases of the circulatory system: Secondary | ICD-10-CM | POA: Diagnosis not present

## 2018-10-10 DIAGNOSIS — Z9114 Patient's other noncompliance with medication regimen: Secondary | ICD-10-CM | POA: Diagnosis not present

## 2018-10-10 DIAGNOSIS — Z950 Presence of cardiac pacemaker: Secondary | ICD-10-CM | POA: Diagnosis not present

## 2018-10-10 DIAGNOSIS — J439 Emphysema, unspecified: Secondary | ICD-10-CM | POA: Diagnosis present

## 2018-10-10 DIAGNOSIS — G9340 Encephalopathy, unspecified: Secondary | ICD-10-CM | POA: Diagnosis present

## 2018-10-10 DIAGNOSIS — I495 Sick sinus syndrome: Secondary | ICD-10-CM

## 2018-10-10 DIAGNOSIS — Z7901 Long term (current) use of anticoagulants: Secondary | ICD-10-CM | POA: Diagnosis not present

## 2018-10-10 DIAGNOSIS — D72829 Elevated white blood cell count, unspecified: Secondary | ICD-10-CM | POA: Diagnosis present

## 2018-10-10 DIAGNOSIS — D696 Thrombocytopenia, unspecified: Secondary | ICD-10-CM | POA: Diagnosis present

## 2018-10-10 DIAGNOSIS — J449 Chronic obstructive pulmonary disease, unspecified: Secondary | ICD-10-CM

## 2018-10-10 DIAGNOSIS — R569 Unspecified convulsions: Secondary | ICD-10-CM | POA: Diagnosis not present

## 2018-10-10 DIAGNOSIS — I10 Essential (primary) hypertension: Secondary | ICD-10-CM | POA: Diagnosis present

## 2018-10-10 DIAGNOSIS — R791 Abnormal coagulation profile: Secondary | ICD-10-CM | POA: Diagnosis present

## 2018-10-10 DIAGNOSIS — G40909 Epilepsy, unspecified, not intractable, without status epilepticus: Principal | ICD-10-CM

## 2018-10-10 DIAGNOSIS — R7989 Other specified abnormal findings of blood chemistry: Secondary | ICD-10-CM

## 2018-10-10 DIAGNOSIS — R55 Syncope and collapse: Secondary | ICD-10-CM | POA: Diagnosis present

## 2018-10-10 DIAGNOSIS — Z20828 Contact with and (suspected) exposure to other viral communicable diseases: Secondary | ICD-10-CM | POA: Diagnosis present

## 2018-10-10 DIAGNOSIS — R748 Abnormal levels of other serum enzymes: Secondary | ICD-10-CM | POA: Diagnosis present

## 2018-10-10 LAB — CBC
HCT: 42 % (ref 39.0–52.0)
Hemoglobin: 14.2 g/dL (ref 13.0–17.0)
MCH: 33.4 pg (ref 26.0–34.0)
MCHC: 33.8 g/dL (ref 30.0–36.0)
MCV: 98.8 fL (ref 80.0–100.0)
Platelets: 145 10*3/uL — ABNORMAL LOW (ref 150–400)
RBC: 4.25 MIL/uL (ref 4.22–5.81)
RDW: 12.5 % (ref 11.5–15.5)
WBC: 7.1 10*3/uL (ref 4.0–10.5)
nRBC: 0 % (ref 0.0–0.2)

## 2018-10-10 LAB — COMPREHENSIVE METABOLIC PANEL
ALT: 12 U/L (ref 0–44)
AST: 42 U/L — ABNORMAL HIGH (ref 15–41)
Albumin: 3.1 g/dL — ABNORMAL LOW (ref 3.5–5.0)
Alkaline Phosphatase: 89 U/L (ref 38–126)
Anion gap: 10 (ref 5–15)
BUN: 8 mg/dL (ref 8–23)
CO2: 22 mmol/L (ref 22–32)
Calcium: 8.7 mg/dL — ABNORMAL LOW (ref 8.9–10.3)
Chloride: 106 mmol/L (ref 98–111)
Creatinine, Ser: 1.01 mg/dL (ref 0.61–1.24)
GFR calc Af Amer: >60 mL/min (ref >60)
GFR calc non Af Amer: >60 mL/min (ref >60)
Glucose, Bld: 83 mg/dL (ref 70–99)
Potassium: 3.2 mmol/L — ABNORMAL LOW (ref 3.5–5.1)
Sodium: 138 mmol/L (ref 135–145)
Total Bilirubin: 1.6 mg/dL — ABNORMAL HIGH (ref 0.3–1.2)
Total Protein: 5.8 g/dL — ABNORMAL LOW (ref 6.5–8.1)

## 2018-10-10 LAB — CK
Total CK: 2039 U/L — ABNORMAL HIGH (ref 49–397)
Total CK: 1773 U/L — ABNORMAL HIGH (ref 49–397)

## 2018-10-10 LAB — PROTIME-INR
INR: 3 — ABNORMAL HIGH (ref 0.8–1.2)
Prothrombin Time: 30.6 seconds — ABNORMAL HIGH (ref 11.4–15.2)

## 2018-10-10 LAB — GLUCOSE, CAPILLARY
Glucose-Capillary: 137 mg/dL — ABNORMAL HIGH (ref 70–99)
Glucose-Capillary: 68 mg/dL — ABNORMAL LOW (ref 70–99)
Glucose-Capillary: 71 mg/dL (ref 70–99)
Glucose-Capillary: 76 mg/dL (ref 70–99)
Glucose-Capillary: 76 mg/dL (ref 70–99)
Glucose-Capillary: 96 mg/dL (ref 70–99)

## 2018-10-10 LAB — VITAMIN B12: Vitamin B-12: 183 pg/mL (ref 180–914)

## 2018-10-10 LAB — MAGNESIUM: Magnesium: 2.1 mg/dL (ref 1.7–2.4)

## 2018-10-10 LAB — GLUCOSE, RANDOM: Glucose, Bld: 81 mg/dL (ref 70–99)

## 2018-10-10 MED ORDER — WARFARIN - PHARMACIST DOSING INPATIENT: Freq: Every day | Status: DC

## 2018-10-10 MED ORDER — KCL-LACTATED RINGERS-D5W 20 MEQ/L IV SOLN
INTRAVENOUS | Status: DC
  Administered 2018-10-10 – 2018-10-11 (×2): via INTRAVENOUS
  Filled 2018-10-10 (×2): qty 1000

## 2018-10-10 MED ORDER — DEXTROSE 50 % IV SOLN
1.0000 | Freq: Once | INTRAVENOUS | Status: AC
  Administered 2018-10-10: 50 mL via INTRAVENOUS
  Filled 2018-10-10: qty 50

## 2018-10-10 MED ORDER — POTASSIUM CHLORIDE CRYS ER 20 MEQ PO TBCR: 40.0000 meq | EXTENDED_RELEASE_TABLET | Freq: Once | ORAL | Status: DC

## 2018-10-10 MED ORDER — DEXTROSE 50 % IV SOLN
INTRAVENOUS | Status: AC
  Filled 2018-10-10: qty 50

## 2018-10-10 MED ORDER — WARFARIN SODIUM 2.5 MG PO TABS
2.5000 mg | ORAL_TABLET | Freq: Once | ORAL | Status: DC
  Filled 2018-10-10: qty 1

## 2018-10-10 NOTE — Progress Notes (Signed)
Discont:  LTM EEG  No skin breakdown
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
## 2018-10-10 NOTE — Progress Notes (Signed)
Reason for consult: seizure  Subjective: No acute events overnight.  Patient seems more awake this morning. States he ran out of LEV 2 days ago and didn't refill.  States he did not take Keppra for almost a year and did not have seizures so he does not understand why he needs to take it now.  Denies any recollection of events yesterday.  States last he remembered he was driving home.  ROS: negative except above  Examination  Vital signs in last 24 hours: Temp:  [97.9 F (36.6 C)-98.9 F (37.2 C)] 98.4 F (36.9 C) (08/05 0700) Pulse Rate:  [67-92] 79 (08/05 0700) Resp:  [15-25] 18 (08/05 0700) BP: (117-161)/(68-84) 161/68 (08/05 0700) SpO2:  [90 %-100 %] 98 % (08/05 0700) FiO2 (%):  [2 %] 2 % (08/04 2033)  General: lying in bed, not in apparent distress  CVS: pulse-normal rate, irregular rhythm RS: breathing comfortably Extremities: normal, warm  Neuro: MS: Alert, oriented to self and place, time: knew it was August but did not know the date correctly,, follows commands CN: pupils equal and reactive,  EOMI, face symmetric, tongue midline, normal sensation over face, Motor: Antigravity strength in all extremities Reflexes: 2+ bilaterally over patella, biceps, plantars: flexor  Basic Metabolic Panel: Recent Labs  Lab 10/09/18 0910 10/10/18 0441  NA 136 138  K 4.0 3.2*  CL 103 106  CO2 19* 22  GLUCOSE 113* 83  BUN 13 8  CREATININE 1.18 1.01  CALCIUM 8.6* 8.7*  MG  --  2.1    CBC: Recent Labs  Lab 10/09/18 0910 10/10/18 0441  WBC 11.4* 7.1  NEUTROABS 10.0*  --   HGB 14.1 14.2  HCT 42.4 42.0  MCV 102.9* 98.8  PLT 173 145*     Coagulation Studies: Recent Labs    10/09/18 1737 10/10/18 0441  LABPROT 34.2* 30.6*  INR 3.5* 3.0*   Mr. Steil is a 68 year old male with known seizure disorder on levetiracetam 500 mg twice daily who was brought in after a breakthrough seizure.   Epilepsy Breakthrough seizure Encephalopathy (improving) Leukocytosis  (resolved) -Breakthrough seizure likely secondary to medication noncompliance.  Patient states he ran out of Keppra 2 days ago and has not refilled it. -LTM did not show any seizures overnight.  Therefore will disconnect.  Recommendations - Patient states he does not have any side effects on Keppra and was not taking the medication because he did not think it was necessary. Discussed the importance of medication compliance.  Offered to switch to different antiepileptic with once daily dosing.  Patient expressed understanding the necessity to take antiseizure medications and states he will take Keppra regularly from here on.  Also discussed seizure precautions including not driving for 6 months according to [facility] after a seizure. I called and updated patient's Sister Bethena Roys. - Continue LEV 500mg  BID - seizure precautions - PRN IV ativan 2mg  for GTC>2 mins -Recommend follow-up in 2 to 4 weeks with outpatient neurologist.   Neurology will sign off.  Please contact us if you have any further questions.  Kaena Santori [name]

## 2018-10-10 NOTE — Progress Notes (Signed)
LTM Video EEG maint. No skin breakdown. Checked electrodes A1,FP1, and CZ

## 2018-10-10 NOTE — Progress Notes (Signed)
Patient's sister called, said she found an empty bottle of Kepra in his car, and his pharmacy has not filled a prescription since 6/17. According to his family member, he may just take the medication when he remembers.

## 2018-10-10 NOTE — Progress Notes (Signed)
[place] for warfarin Indication: atrial fibrillation  Patient Measurements: Height: 5\' 9"  (175.3 cm) Weight: 181 lb 7 oz (82.3 kg) IBW/kg (Calculated) : 70.7  Vital Signs: Temp: 98.4 F (36.9 C) (08/05 1100) Temp Source: Oral (08/05 1100) BP: 155/80 (08/05 1100) Pulse Rate: 70 (08/05 1100)    Assessment: 68 year old male admitted for seizure. On warfarin PTA for Afib. PTA dose is 5 mg on MWF, 2.5 mg on all other days per outpatient clinic note. Unable to confirm with patient due to altered mental status. Pharmacy consulted to dose warfarin. Patient did not receive warfarin last night due to supratherapeutic INR. INR down to 3.  PTA warfarin dose: 5 mg Mon/Wed/Fri, 2.5 mg on all other days   Goal of Therapy:  INR 2-3 Monitor platelets by anticoagulation protocol: Yes    Plan:  -Warfarin 2.5 mg po x1 -Daily INR   Harvel Quale 10/10/2018 1:53 PM

## 2018-10-10 NOTE — Progress Notes (Signed)
Page to MD on call to inform blood sugar was 86. Md request recheck in a few hours. Later called to inform blood sugar was 76. 1 amp of D50 ordered.

## 2018-10-10 NOTE — Discharge Instructions (Signed)

## 2018-10-10 NOTE — Care Management Obs Status (Signed)
[place] NOTIFICATION   Patient Details  Name: Patrick Kelly MRN: [id number] Date of Birth: 10-Oct-1950   Medicare Observation Status Notification Given:  Yes    [place], [place] 10/10/2018, 4:28 PM

## 2018-10-10 NOTE — Procedures (Signed)
Patient Name: Patrick Kelly  MRN: [id number]  Epilepsy Attending: Lora Havens  Referring Physician/Provider: Dr Zeb Comfort Duration: 10/09/2018 1535 to 10/10/2018  1319  Patient history: 68Yo M with h/o seizures, presented with breakthrough seizure. EEG ordered to rule out status  Level of alertness: awake, asleep  AEDs during EEG study: LEV  Technical aspects: This EEG study was done with scalp electrodes positioned according to the 10-20 International system of electrode placement. Electrical activity was acquired at a sampling rate of 500Hz  and reviewed with a high frequency filter of 70Hz  and a low frequency filter of 1Hz . EEG data were recorded continuously and digitally stored.   DESCRIPTION: The posterior dominant rhythm consists of 10-11 Hz activity of moderate voltage (25-35 uV) seen predominantly in posterior head regions, symmetric and reactive to eye opening and eye closing. During sleep, vertex waves and sleep spindles, maximal frontocentral were seen. There is an excessive amount of 15 to 18 Hz, 2-3 uV beta activity with irregular morphology distributed symmetrically and diffusely.  Hyperventilation and photic stimulation were not performed.  IMPRESSION: This study is within normal limits. No seizures or epileptiform discharges were seen throughout the recording. The excessive beta activity seen in the background is most likely due to the effect of benzodiazepine.
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
## 2018-10-10 NOTE — TOC Initial Note (Signed)
Transition of Care [facility](TOC) - Initial/Assessment Note    Patient Details  Name: Patrick MealyDaniel L Germano MRN: [id number] Date of Birth: 1950-11-15  Transition of Care [facility](TOC) CM/SW Contact:    Gwenlyn Fudgeaitlin B Isabele Lollar, LCSWA Phone Number: 10/10/2018, 4:36 PM  Clinical Narrative:                  CSW called and completed MOON letter with the patient's sister. CSW asked if the patient has had any home health services in the past. The patient's sister, Patrick Kelly stated that her brother is very independent and worked full time before admitting to the hospital. She declined home health services. CSW asked if they had a plan to assist the patient with his medications, she stated that they call him and remind him. CSW informed Patrick Kelly that they could follow up with the patient's PCP if they needed home health after the hospitalization.   CSW is signing off for now. Please re-consult if CSW is needed again in the future.   Expected Discharge Plan: Home/Self Care Barriers to Discharge: Continued Medical Work up   Patient Goals and CMS Choice Patient states their goals for this hospitalization and ongoing recovery are:: Pt sister is declining home health services   Choice offered to / list presented to : NA  Expected Discharge Plan and Services Expected Discharge Plan: Home/Self Care In-house Referral: Clinical Social Work Discharge Planning Services: NA Post Acute Care Choice: NA Living arrangements for the past 2 months: Single Family Home                 DME Arranged: N/A DME Agency: NA       HH Arranged: NA HH Agency: NA        Prior Living Arrangements/Services Living arrangements for the past 2 months: Single Family Home Lives with:: Self Patient language and need for interpreter reviewed:: No Do you feel safe going back to the place where you live?: Yes      Need for Family Participation in Patient Care: No (Comment) Care giver support system in place?: No (comment)   Criminal Activity/Legal  Involvement Pertinent to Current Situation/Hospitalization: No - Comment as needed  Activities of Daily Living Home Assistive Devices/Equipment: None ADL Screening (condition at time of admission) Patient's cognitive ability adequate to safely complete daily activities?: No Is the patient deaf or have difficulty hearing?: No Does the patient have difficulty seeing, even when wearing glasses/contacts?: No Does the patient have difficulty concentrating, remembering, or making decisions?: Yes Patient able to express need for assistance with ADLs?: Yes Does the patient have difficulty dressing or bathing?: Yes Independently performs ADLs?: No Communication: Independent Does the patient have difficulty walking or climbing stairs?: Yes Weakness of Legs: Both Weakness of Arms/Hands: Both  Permission Sought/Granted Permission sought to share information with : Case Manager Permission granted to share information with : Yes, Verbal Permission Granted  Share Information with NAME: Patrick Kelly  Permission granted to share info w AGENCY: Declined services  Permission granted to share info w Relationship: sister     Emotional Assessment Appearance:: Appears stated age Attitude/Demeanor/Rapport: Unable to Assess Affect (typically observed): Unable to Assess Orientation: : Oriented to Self, Oriented to Place Alcohol / Substance Use: Not Applicable Psych Involvement: No (comment)  Admission diagnosis:  Seizure [facility](HCC) [R56.9] Patient Active Problem List   Diagnosis Date Noted  . Seizure (HCC) 03/05/2018  . Encounter for therapeutic drug monitoring 06/23/2017  . Emphysema lung (HCC) 07/12/2016  . S/P placement of cardiac pacemaker  06/22/2016  . PAF (paroxysmal atrial fibrillation) ([place])   . Peripheral vascular obstructive disease ([place])   . Multiple fractures of ribs, right side, initial encounter for closed fracture 06/09/2016  . Sinusitis 06/09/2016  . Pulmonary nodule 06/09/2016  . Syncope  06/09/2016  . Tobacco use   . Abnormal CT of the chest   . Dental caries   . Multiple closed fractures of ribs of right side   . Essential hypertension    PCP:  Charlott Rakes, MD Pharmacy:   [id number] - [place], [place] [address] Phone: [phone] Fax: [phone]  [facility] [place], [place] [address] [place] [address] Phone: [phone] Fax: [phone]     Social Determinants of Health (SDOH) Interventions    Readmission Risk Interventions No flowsheet data found.

## 2018-10-11 DIAGNOSIS — D696 Thrombocytopenia, unspecified: Secondary | ICD-10-CM

## 2018-10-11 LAB — COMPREHENSIVE METABOLIC PANEL
ALT: 10 U/L (ref 0–44)
AST: 31 U/L (ref 15–41)
Albumin: 2.6 g/dL — ABNORMAL LOW (ref 3.5–5.0)
Alkaline Phosphatase: 70 U/L (ref 38–126)
Anion gap: 6 (ref 5–15)
BUN: 7 mg/dL — ABNORMAL LOW (ref 8–23)
CO2: 25 mmol/L (ref 22–32)
Calcium: 8.1 mg/dL — ABNORMAL LOW (ref 8.9–10.3)
Chloride: 105 mmol/L (ref 98–111)
Creatinine, Ser: 0.87 mg/dL (ref 0.61–1.24)
GFR calc Af Amer: >60 mL/min (ref >60)
GFR calc non Af Amer: >60 mL/min (ref >60)
Glucose, Bld: 89 mg/dL (ref 70–99)
Potassium: 3.4 mmol/L — ABNORMAL LOW (ref 3.5–5.1)
Sodium: 136 mmol/L (ref 135–145)
Total Bilirubin: 1.3 mg/dL — ABNORMAL HIGH (ref 0.3–1.2)
Total Protein: 4.8 g/dL — ABNORMAL LOW (ref 6.5–8.1)

## 2018-10-11 LAB — CBC WITH DIFFERENTIAL/PLATELET
Abs Immature Granulocytes: 0.01 10*3/uL (ref 0.00–0.07)
Basophils Absolute: 0 10*3/uL (ref 0.0–0.1)
Basophils Relative: 0 %
Eosinophils Absolute: 0.1 10*3/uL (ref 0.0–0.5)
Eosinophils Relative: 2 %
HCT: 38.4 % — ABNORMAL LOW (ref 39.0–52.0)
Hemoglobin: 13 g/dL (ref 13.0–17.0)
Immature Granulocytes: 0 %
Lymphocytes Relative: 15 %
Lymphs Abs: 0.9 10*3/uL (ref 0.7–4.0)
MCH: 33.8 pg (ref 26.0–34.0)
MCHC: 33.9 g/dL (ref 30.0–36.0)
MCV: 99.7 fL (ref 80.0–100.0)
Monocytes Absolute: 0.6 10*3/uL (ref 0.1–1.0)
Monocytes Relative: 11 %
Neutro Abs: 4.2 10*3/uL (ref 1.7–7.7)
Neutrophils Relative %: 72 %
Platelets: 124 10*3/uL — ABNORMAL LOW (ref 150–400)
RBC: 3.85 MIL/uL — ABNORMAL LOW (ref 4.22–5.81)
RDW: 12.5 % (ref 11.5–15.5)
WBC: 5.8 10*3/uL (ref 4.0–10.5)
nRBC: 0 % (ref 0.0–0.2)

## 2018-10-11 LAB — PROTIME-INR
INR: 2.9 — ABNORMAL HIGH (ref 0.8–1.2)
Prothrombin Time: 29.8 s — ABNORMAL HIGH (ref 11.4–15.2)

## 2018-10-11 LAB — GLUCOSE, CAPILLARY
Glucose-Capillary: 100 mg/dL — ABNORMAL HIGH (ref 70–99)
Glucose-Capillary: 87 mg/dL (ref 70–99)
Glucose-Capillary: 108 mg/dL — ABNORMAL HIGH (ref 70–99)

## 2018-10-11 LAB — LEVETIRACETAM LEVEL: Levetiracetam Lvl: <1 ug/mL — ABNORMAL LOW (ref 10.0–40.0)

## 2018-10-11 LAB — CK: Total CK: 1120 U/L — ABNORMAL HIGH (ref 49–397)

## 2018-10-11 MED ORDER — WARFARIN SODIUM 2.5 MG PO TABS
2.5000 mg | ORAL_TABLET | Freq: Once | ORAL | Status: DC
  Filled 2018-10-11: qty 1

## 2018-10-11 MED ORDER — LEVETIRACETAM 500 MG PO TABS: 500.0000 mg | ORAL_TABLET | Freq: Two times a day (BID) | ORAL | 0 refills | Status: DC

## 2018-10-11 MED ORDER — VITAMIN B-12 1000 MCG PO TABS: 1000.0000 ug | ORAL_TABLET | Freq: Every day | ORAL | 0 refills | Status: AC

## 2018-10-11 NOTE — Progress Notes (Addendum)
   Pt walked from room to nursing station and back. Pt is slightly unsteady but pt verbalizes instability is due to lying in bed for too long. Condom cath removed, pt was able to void and have a BM after walking. Oxygen removed. Pt is sating fine.  Pt d/c home to self care. No new concerns pt is stable and on room air. D/c instructions done with teach back. Pt verbalize understanding. Pt will be transported out of the hospital by family
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
## 2018-10-11 NOTE — Progress Notes (Signed)
Pt passed [facility] swallow evaluation . MD on call notified and stated will re assess pt this am before diet is ordered

## 2018-10-11 NOTE — Progress Notes (Signed)
[place] for warfarin Indication: atrial fibrillation  Patient Measurements: Height: 5\' 9"  (175.3 cm) Weight: 181 lb 7 oz (82.3 kg) IBW/kg (Calculated) : 70.7  Vital Signs: Temp: 97.6 F (36.4 C) (08/06 1100) Temp Source: Oral (08/06 1100) BP: 122/74 (08/06 1100) Pulse Rate: 72 (08/06 1100)    Assessment: 68 year old male admitted for seizure. On warfarin PTA for Afib. PTA dose is 5 mg on MWF, 2.5 mg on all other days per outpatient clinic note. Unable to confirm with patient due to altered mental status. Pharmacy consulted to dose warfarin. Patient did not receive warfarin last night due to supratherapeutic INR.   INR therapeutic at 2.9, did not receive warfarin dose last night d/t NPO status  PTA warfarin dose: 5 mg Mon/Wed/Fri, 2.5 mg on all other days  Goal of Therapy:  INR 2-3 Monitor platelets by anticoagulation protocol: Yes    Plan:  Give warfarin 2.5mg  PO x1 tonight Daily INR, s/s bleeding  Bertis Ruddy, PharmD Clinical Pharmacist Please check AMION for all [place] numbers 10/11/2018 12:54 PM

## 2018-10-11 NOTE — TOC Transition Note (Signed)
Transition of Care [facility]) - CM/SW Discharge Note   Patient Details  Name: SYLER NORCIA MRN: [id number] Date of Birth: 12-21-1950  Transition of Care [facility]) CM/SW Contact:  Pollie Friar, RN Phone Number: 10/11/2018, 1:34 PM   Clinical Narrative:    Pt discharging home with self care. CM spoke with patient and he granted permission for CM to speak to his sisters. CM called Bethena Roys and she asked that his meds be sent to [place] [facility]. [facility] pharmacy notified to have them transferred. CM then talked to Sam and her boyfriend is going to provide transportation home for the patient. Pick up is between 3:30-4pm. Bedside RN updated.  Final next level of care: Home/Self Care Barriers to Discharge: No Barriers Identified   Patient Goals and CMS Choice Patient states their goals for this hospitalization and ongoing recovery are:: Pt sister is declining home health services   Choice offered to / list presented to : NA  Discharge Placement                       Discharge Plan and Services In-house Referral: Clinical Social Work Discharge Planning Services: NA Post Acute Care Choice: NA          DME Arranged: N/A DME Agency: NA       HH Arranged: NA HH Agency: NA        Social Determinants of Health (SDOH) Interventions     Readmission Risk Interventions No flowsheet data found.

## 2018-10-12 ENCOUNTER — Telehealth: Payer: Self-pay | Admitting: Family Medicine

## 2018-10-12 ENCOUNTER — Ambulatory Visit
Admission: EM | Admit: 2018-10-12 | Discharge: 2018-10-12 | Disposition: A | Discharge status: Home / Self Care | Payer: Medicare Other | Attending: Physician Assistant | Admitting: Physician Assistant

## 2018-10-12 ENCOUNTER — Other Ambulatory Visit: Payer: Self-pay

## 2018-10-12 DIAGNOSIS — M545 Low back pain, unspecified: Secondary | ICD-10-CM

## 2018-10-12 LAB — METHYLMALONIC ACID, SERUM: Methylmalonic Acid, Quantitative: 273 nmol/L (ref 0–378)

## 2018-10-12 MED ORDER — DICLOFENAC SODIUM 1 % TD GEL: 2.0000 g | Freq: Four times a day (QID) | TRANSDERMAL | 0 refills | Status: DC

## 2018-10-12 NOTE — ED Triage Notes (Signed)
Pt reports that he was just discharged from hospital for having a seizure. States that his lower back is hurting him.

## 2018-10-12 NOTE — ED Provider Notes (Signed)
[facility]    CSN: [id number] Arrival date & time: 10/12/18  1617      History   Chief Complaint Chief Complaint  Patient presents with  . Back Pain    HPI Patrick Kelly is a 68 y.o. male.   68 year old male with history of seizures comes in for evaluation of back pain after hospital stay.  He was seen on 10/09/2018 for seizures, and was admitted for observation due to prolonged postictal phase.  He was discharged on the fifth, and states noted back pain at that time.  Back pain is intermittent, only present with movement/positional changes.  Denies radiation of pain.  Denies saddle anesthesia, loss of bladder or bowel control.  Denies fever, chills, night sweats.  Denies urinary symptoms such as frequency, dysuria, hematuria.  He has not taken anything for the symptoms, but states bought Tylenol prior to arrival.  He has been continuing his Keppra, warfarin as directed.     Past Medical History:  Diagnosis Date  . Abnormal CT of the chest    emphysemetous changes suggestive copd  . Dental caries   . Multiple fractures of ribs   . Pulmonary nodule   . Seizures (HCC)   . Sinusitis   . Syncope   . Tobacco use     Patient Active Problem List   Diagnosis Date Noted  . Seizure (HCC) 03/05/2018  . Encounter for therapeutic drug monitoring 06/23/2017  . Emphysema lung (HCC) 07/12/2016  . 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 PAF (paroxysmal atrial fibrillation) (HCC)   . Peripheral vascular obstructive disease (HCC)   . Multiple fractures of ribs, right side, initial encounter for closed fracture 06/09/2016  . Sinusitis 06/09/2016  . Pulmonary nodule 06/09/2016  . Syncope 06/09/2016  . Tobacco use   . Abnormal CT of the chest   . Dental caries   . Multiple closed fractures of ribs of right side   . Essential hypertension     Past Surgical History:  Procedure Laterality Date  . PACEMAKER IMPLANT N/A 06/14/2016   Procedure: Pacemaker  Implant-Dual chamber;  Surgeon: Marinus MawGregg W Taylor, MD;  [facility];  Service: Cardiovascular;  Laterality: N/A;       Home Medications    Prior to Admission medications   Medication Sig Start Date End Date Taking? Authorizing Provider  diclofenac sodium (VOLTAREN) 1 % GEL Apply 2 g topically 4 (four) times daily. 10/12/18   Cathie HoopsYu, Momo Braun V, PA-C  levETIRAcetam (KEPPRA) 500 MG tablet Take 1 tablet (500 mg total) by mouth 2 (two) times daily. 10/11/18   Angelita InglesWinfrey, William B, MD  vitamin B-12 (CYANOCOBALAMIN) 1000 MCG tablet Take 1 tablet (1,000 mcg total) by mouth daily. 10/11/18   Angelita InglesWinfrey, William B, MD  warfarin (COUMADIN) 5 MG tablet Take 1 tablet (5 mg total) by mouth daily. TAKE 2.5 MG daily and 5 mg on mondays, wednesdays and fridays. 03/05/18   Eddie Northhungel, Nishant, MD    Family History Family History  Problem Relation Age of Onset  . Hypertension Sister   . Healthy Mother   . Seizures Neg Hx     Social History Social History   Tobacco Use  . Smoking status: Current Every Day Smoker    Packs/day: 1.00    Types: Cigarettes  . Smokeless tobacco: Never Used  Substance Use Topics  . Alcohol use: No  . Drug use: Yes     Allergies   Patient has no known allergies.  Review of Systems Review of Systems  Reason unable to perform ROS: See HPI as above.     Physical Exam Triage Vital Signs ED Triage Vitals  Enc Vitals Group     BP 10/12/18 1629 108/73     Pulse Rate 10/12/18 1629 94     Resp 10/12/18 1633 20     Temp 10/12/18 1629 98 F (36.7 C)     Temp Source 10/12/18 1629 Oral     SpO2 10/12/18 1629 92 %     Weight --      Height --      Head Circumference --      Peak Flow --      Pain Score 10/12/18 1632 10     Pain Loc --      Pain Edu? --      Excl. in GC? --    No data found.  Updated Vital Signs BP 108/73 (BP Location: Left Arm)   Pulse 94   Temp 98 F (36.7 C) (Oral)   Resp 20   SpO2 92%   Physical Exam Constitutional:      General: He  is not in acute distress.    Appearance: He is well-developed. He is not diaphoretic.  HENT:     Head: Normocephalic and atraumatic.  Eyes:     Conjunctiva/sclera: Conjunctivae normal.     Pupils: Pupils are equal, round, and reactive to light.  Cardiovascular:     Rate and Rhythm: Normal rate and regular rhythm.     Heart sounds: Normal heart sounds. No murmur. No friction rub. No gallop.   Pulmonary:     Effort: Pulmonary effort is normal. No accessory muscle usage or respiratory distress.     Breath sounds: Normal breath sounds. No stridor. No decreased breath sounds, wheezing, rhonchi or rales.  Musculoskeletal:     Comments: No tenderness on palpation of the spinous processes.  Tenderness to palpation of right paraspinal muscles to the lumbar region.  Full range of motion of back and hips. Strength normal and equal bilaterally. Sensation intact and equal bilaterally.  Negative straight leg raise.  Skin:    General: Skin is warm and dry.  Neurological:     Mental Status: He is alert and oriented to person, place, and time.     UC Treatments / Results  Labs (all labs ordered are listed, but only abnormal results are displayed) Labs Reviewed - No data to display  EKG   Radiology No results found.  Procedures Procedures (including critical care time)  Medications Ordered in UC Medications - No data to display  Initial Impression / Assessment and Plan / UC Course  I have reviewed the triage vital signs and the nursing notes.  Pertinent labs & imaging results that were available during my care of the patient were reviewed by me and considered in my medical decision making (see chart for details).    Discussed possible muscle strain causing symptoms.  Given patient on warfarin, will avoid oral NSAIDs at this time.  Can continue Tylenol and supplement with Voltaren gel as needed.  Discussed to continue to monitor, this may take a few weeks to completely resolve, but should  be feeling better each week.  Patient due for follow-up with PCP next week for recent admission.  Will have patient recheck at that time.  Return precautions given.  Patient expresses understanding and agrees to plan.  Final Clinical Impressions(s) / UC Diagnoses   Final diagnoses:  Acute  right-sided low back pain without sciatica    ED Prescriptions    Medication Sig Dispense Auth. Provider   diclofenac sodium (VOLTAREN) 1 % GEL Apply 2 g topically 4 (four) times daily. 150 g Tobin Chad, [place] 10/12/18 1725

## 2018-10-12 NOTE — Telephone Encounter (Signed)
-----   Message from Ladell Pier, MD sent at 10/11/2018 10:06 PM EDT ----- Regarding: needs hosp f/u appt with Dr. Margarita Rana

## 2018-10-12 NOTE — Discharge Instructions (Addendum)
Continue tylenol 650mg -1000mg  three times a day to help with back pain. Can supplement with voltaren gel. Warm compress to the back. This may take a few weeks to completely resolve, but should be feeling better each week. Follow up with PCP next week for reevaluation and hospital follow up.

## 2018-10-12 NOTE — Telephone Encounter (Signed)
Attempted to reach patient no answer unable to lvm °

## 2018-10-15 ENCOUNTER — Telehealth: Payer: Self-pay | Admitting: Adult Health

## 2018-10-15 NOTE — Telephone Encounter (Signed)
Noted! Thank you

## 2018-10-15 NOTE — Telephone Encounter (Signed)
I called and spoke to sister of pt.  Pt seen recently in hospital for seizures.  He has not been taking his seizure medication as ordered.  Last seizure 10-09-18 (see epic note).   Pt is not driving at this time.  Brother in law to come in with pt. Has to leave prior to 0945 as has to get labs drawn at cardiology office (INR).  Has amnesia events after seizures.  Does not believe he has seizures has tests come back negative.  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  Pt lives alone, but sister and bro in law near.  Live in [place].  Wants [place] to make pt aware even though test negative, does not mean he does not have seizures.  Bro in law to come back with pt.

## 2018-10-15 NOTE — Telephone Encounter (Signed)
Pt's sister called wanting to speak to provider about the pts appt this Thursday. She refused to give any more information just that she wanted to discuss that appt. Please advise.

## 2018-10-16 ENCOUNTER — Telehealth: Payer: Self-pay | Admitting: Emergency Medicine
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
## 2018-10-18 ENCOUNTER — Ambulatory Visit (INDEPENDENT_AMBULATORY_CARE_PROVIDER_SITE_OTHER): Payer: Medicare Other | Admitting: *Deleted

## 2018-10-18 ENCOUNTER — Ambulatory Visit (INDEPENDENT_AMBULATORY_CARE_PROVIDER_SITE_OTHER): Payer: Medicare Other | Admitting: Adult Health

## 2018-10-18 ENCOUNTER — Encounter: Payer: Self-pay | Admitting: Adult Health

## 2018-10-18 ENCOUNTER — Other Ambulatory Visit: Payer: Self-pay

## 2018-10-18 VITALS — BP 114/74 | HR 69 | Temp 96.6°F | Ht 70.0 in | Wt 172.0 lb

## 2018-10-18 DIAGNOSIS — Z5181 Encounter for therapeutic drug level monitoring: Secondary | ICD-10-CM | POA: Diagnosis not present

## 2018-10-18 DIAGNOSIS — R569 Unspecified convulsions: Secondary | ICD-10-CM

## 2018-10-18 DIAGNOSIS — I480 Paroxysmal atrial fibrillation: Secondary | ICD-10-CM

## 2018-10-18 LAB — PROTIME-INR
INR: 5.9 (ref 0.8–1.2)
Prothrombin Time: 62.6 s — ABNORMAL HIGH (ref 9.1–12.0)

## 2018-10-18 LAB — POCT INR: INR: 6.4 — AB (ref 2.0–3.0)

## 2018-10-18 MED ORDER — LEVETIRACETAM 500 MG PO TABS: 500.0000 mg | ORAL_TABLET | Freq: Two times a day (BID) | ORAL | 3 refills | Status: DC

## 2018-10-18 NOTE — Patient Instructions (Signed)
Your Plan:  Continue ongoing use of Keppra 500 mg twice daily for seizure prevention  NO DRIVING for 6 months per [facility] law  Continue to follow with Coumadin clinic for INR levels    Follow-up in 3 months or call earlier if needed    Thank you for coming to see [place] at [facility]. I hope we have been able to provide you high quality care today.  You may receive a patient satisfaction survey over the next few weeks. We would appreciate your feedback and comments so that we may continue to improve ourselves and the health of our patients.
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
## 2018-10-18 NOTE — Patient Instructions (Addendum)
Description   Spoke with patients sister, advised to hold coumadin Friday, Saturday and Sunday then resume normal Dosage: 1/2 tablet daily except 1 tablet on Mondays, Wednesdays, Fridays.  Patient diet has been poor and has not been eating the greens he normally eats. Advised sister to call us if diet does not improve as we will need to reduce dose. Recheck INR 1 week. Call Coumadin clinic with new medications [phone]

## 2018-10-25 ENCOUNTER — Other Ambulatory Visit: Payer: Self-pay

## 2018-10-25 ENCOUNTER — Ambulatory Visit (INDEPENDENT_AMBULATORY_CARE_PROVIDER_SITE_OTHER): Payer: Medicare Other | Admitting: *Deleted

## 2018-10-25 DIAGNOSIS — Z5181 Encounter for therapeutic drug level monitoring: Secondary | ICD-10-CM

## 2018-10-25 DIAGNOSIS — I480 Paroxysmal atrial fibrillation: Secondary | ICD-10-CM

## 2018-10-25 LAB — POCT INR: INR: 1.9 — AB (ref 2.0–3.0)

## 2018-10-25 NOTE — Patient Instructions (Addendum)
  Description    Take an extra 1/2 tablet today, then continue taking 1/2 tablet daily except 1 tablet on Mondays, Wednesdays, Fridays. Be consistent with your green vegetable intake. Recheck INR 10 days. Call Coumadin clinic with new medications [phone]

## 2018-10-28 NOTE — Progress Notes (Signed)
Made any corrections needed, and agree with history, physical, neuro exam,assessment and plan as stated.     Monquie Fulgham, MD [facility]  

## 2018-10-30 ENCOUNTER — Telehealth: Payer: Self-pay | Admitting: Adult Health

## 2018-10-30 NOTE — Telephone Encounter (Signed)
Pts sister dropped off DMV forms and disability forms- requesting a call to discuss the paperwork. Stating the pt wont open up to the RN or NP as he should. Please call Bethena Roys at [phone]. Bethena Roys paid $50 form fee

## 2018-10-30 NOTE — Telephone Encounter (Signed)
I have note received at my desk as yet.

## 2018-10-31 DIAGNOSIS — Z0289 Encounter for other administrative examinations: Secondary | ICD-10-CM

## 2018-10-31 NOTE — Telephone Encounter (Signed)
Received this am.

## 2018-11-01 NOTE — Telephone Encounter (Signed)
I have started completion of DMV form, ST disability (complete)?

## 2018-11-01 NOTE — Telephone Encounter (Signed)
Memory/cognition was not of concern during prior visit or hospitalization with recent seizure.  I would request evaluation in our office with possible need of additional imaging to assess for any underlying conditions that could be causing his symptoms such as stroke, additional seizures or medication side effects.

## 2018-11-01 NOTE — Telephone Encounter (Signed)
I called sister, Patrick Kelly (on [place]).  I relayed that have received form, started on DMV and ST Disability, JM/NP will be taking alook at this.  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  His work is stating that he is not able to do his job at this time.  She states his mind, and coordination is affected.  Due to seizures?  Take time to recover?  Sister, states he is not sharp, not thinking clearly, struggles with basic activities, cannot even write a check.  She states since last seizures he is very sore, can hardly walk, get out of car, confusion.  She states memory problem is new since seizures.  She relayed that he was not compliant with taking his keppra and that's why he had seizures.  Since last 10-09-18 seizure he has been compliant.  He not driving. ([place] due to [place]).  ? Something else going on. Mom has dementia. New referral for memory?  He has not income, no savings, has used his leave and drawing on next yrs leave.  She would be appreciative if can help.  Pt has appt with new pcp (he has not had to go to one previously), first time seeing him this coming Tuesday.

## 2018-11-05 NOTE — Telephone Encounter (Signed)
I called sister of pt.  I made appt 11-07-18 at 2:15pm for pt to see about cognition, memory issues.  He has appt with pcp on Tuesday and depending on this appt will possibly cancel the appt for Wednesday.

## 2018-11-06 ENCOUNTER — Other Ambulatory Visit: Payer: Self-pay

## 2018-11-06 ENCOUNTER — Ambulatory Visit (INDEPENDENT_AMBULATORY_CARE_PROVIDER_SITE_OTHER): Payer: Medicare Other

## 2018-11-06 ENCOUNTER — Inpatient Hospital Stay: Payer: PRIVATE HEALTH INSURANCE | Admitting: Critical Care Medicine

## 2018-11-06 DIAGNOSIS — I480 Paroxysmal atrial fibrillation: Secondary | ICD-10-CM

## 2018-11-06 DIAGNOSIS — Z5181 Encounter for therapeutic drug level monitoring: Secondary | ICD-10-CM

## 2018-11-06 LAB — POCT INR: INR: 3 (ref 2.0–3.0)

## 2018-11-06 NOTE — Patient Instructions (Signed)
Description   Continue on same dosage 1/2 tablet daily except 1 tablet on Mondays, Wednesdays, and Fridays. Be consistent with your green vegetable intake. Recheck INR 3 weeks. Call Coumadin clinic with new medications [phone]

## 2018-11-07 ENCOUNTER — Encounter: Payer: Self-pay | Admitting: Adult Health

## 2018-11-07 ENCOUNTER — Ambulatory Visit (INDEPENDENT_AMBULATORY_CARE_PROVIDER_SITE_OTHER): Payer: Medicare Other | Admitting: Adult Health

## 2018-11-07 VITALS — BP 105/72 | HR 83 | Temp 97.7°F | Ht 70.0 in | Wt 167.6 lb

## 2018-11-07 DIAGNOSIS — Z79899 Other long term (current) drug therapy: Secondary | ICD-10-CM

## 2018-11-07 DIAGNOSIS — R413 Other amnesia: Secondary | ICD-10-CM | POA: Diagnosis not present

## 2018-11-07 DIAGNOSIS — R569 Unspecified convulsions: Secondary | ICD-10-CM | POA: Diagnosis not present

## 2018-11-07 DIAGNOSIS — E538 Deficiency of other specified B group vitamins: Secondary | ICD-10-CM | POA: Diagnosis not present

## 2018-11-07 DIAGNOSIS — Z9114 Patient's other noncompliance with medication regimen: Secondary | ICD-10-CM

## 2018-11-07 DIAGNOSIS — R419 Unspecified symptoms and signs involving cognitive functions and awareness: Secondary | ICD-10-CM | POA: Diagnosis not present

## 2018-11-07 NOTE — Patient Instructions (Signed)
Your Plan:  We will do lab work today to assess for any underlying factors that could be causing memory loss  You will be called to schedule MRI to assess for any underlying factors that could be causing memory loss  Recommend undergoing neurocognitive evaluation as office testing likely not accurate due to education/intelligence level  We will provide short-term disability at this time  Continue Keppra 500 mg twice daily for seizure prevention -very important for ongoing compliance  Follow up in 2 months or call earlier if needed      Thank you for coming to see [place] at [facility]. I hope we have been able to provide you high quality care today.  You may receive a patient satisfaction survey over the next few weeks. We would appreciate your feedback and comments so that we may continue to improve ourselves and the health of our patients.
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
## 2018-11-08 ENCOUNTER — Other Ambulatory Visit: Payer: Self-pay

## 2018-11-08 ENCOUNTER — Encounter: Payer: Self-pay | Admitting: Critical Care Medicine

## 2018-11-08 ENCOUNTER — Ambulatory Visit: Payer: Medicare Other | Attending: Critical Care Medicine | Admitting: Critical Care Medicine

## 2018-11-08 VITALS — BP 121/79 | HR 72 | Temp 98.3°F | Wt 167.8 lb

## 2018-11-08 DIAGNOSIS — Z7901 Long term (current) use of anticoagulants: Secondary | ICD-10-CM | POA: Insufficient documentation

## 2018-11-08 DIAGNOSIS — Z720 Tobacco use: Secondary | ICD-10-CM | POA: Diagnosis not present

## 2018-11-08 DIAGNOSIS — I480 Paroxysmal atrial fibrillation: Secondary | ICD-10-CM | POA: Diagnosis not present

## 2018-11-08 DIAGNOSIS — R9389 Abnormal findings on diagnostic imaging of other specified body structures: Secondary | ICD-10-CM | POA: Diagnosis not present

## 2018-11-08 DIAGNOSIS — G8929 Other chronic pain: Secondary | ICD-10-CM | POA: Diagnosis not present

## 2018-11-08 DIAGNOSIS — K029 Dental caries, unspecified: Secondary | ICD-10-CM | POA: Diagnosis not present

## 2018-11-08 DIAGNOSIS — M545 Low back pain, unspecified: Secondary | ICD-10-CM | POA: Insufficient documentation

## 2018-11-08 DIAGNOSIS — R918 Other nonspecific abnormal finding of lung field: Secondary | ICD-10-CM

## 2018-11-08 DIAGNOSIS — Z791 Long term (current) use of non-steroidal anti-inflammatories (NSAID): Secondary | ICD-10-CM | POA: Diagnosis not present

## 2018-11-08 DIAGNOSIS — R911 Solitary pulmonary nodule: Secondary | ICD-10-CM | POA: Insufficient documentation

## 2018-11-08 DIAGNOSIS — Z79899 Other long term (current) drug therapy: Secondary | ICD-10-CM | POA: Insufficient documentation

## 2018-11-08 DIAGNOSIS — Z1159 Encounter for screening for other viral diseases: Secondary | ICD-10-CM | POA: Diagnosis not present

## 2018-11-08 DIAGNOSIS — I739 Peripheral vascular disease, unspecified: Secondary | ICD-10-CM | POA: Diagnosis not present

## 2018-11-08 DIAGNOSIS — I10 Essential (primary) hypertension: Secondary | ICD-10-CM | POA: Diagnosis not present

## 2018-11-08 DIAGNOSIS — R569 Unspecified convulsions: Secondary | ICD-10-CM

## 2018-11-08 DIAGNOSIS — Z1211 Encounter for screening for malignant neoplasm of colon: Secondary | ICD-10-CM

## 2018-11-08 DIAGNOSIS — F17210 Nicotine dependence, cigarettes, uncomplicated: Secondary | ICD-10-CM | POA: Insufficient documentation

## 2018-11-08 DIAGNOSIS — G40909 Epilepsy, unspecified, not intractable, without status epilepticus: Secondary | ICD-10-CM | POA: Diagnosis not present

## 2018-11-08 DIAGNOSIS — Z950 Presence of cardiac pacemaker: Secondary | ICD-10-CM | POA: Insufficient documentation

## 2018-11-08 DIAGNOSIS — J438 Other emphysema: Secondary | ICD-10-CM | POA: Diagnosis not present

## 2018-11-08 DIAGNOSIS — Z23 Encounter for immunization: Secondary | ICD-10-CM

## 2018-11-08 DIAGNOSIS — J439 Emphysema, unspecified: Secondary | ICD-10-CM | POA: Diagnosis not present

## 2018-11-08 HISTORY — DX: Low back pain, unspecified: M54.50

## 2018-11-08 HISTORY — DX: Other chronic pain: G89.29

## 2018-11-08 NOTE — Assessment & Plan Note (Signed)
Chronic low back pain without radiculopathy  The patient will continue to use the Voltaren gel and as needed Tylenol and will obtain lumbar films

## 2018-11-08 NOTE — Assessment & Plan Note (Signed)
Paroxysmal atrial fibrillation currently now in sinus rhythm and not on maintenance medication

## 2018-11-08 NOTE — Assessment & Plan Note (Signed)
Patient has significant dental caries and is in need of dental hygiene and likely even full extraction of all the teeth coordinating with cardiology in the warfarin clinic  A list of dentists was given to the patient to choose from he does have Medicare and should be able to get access to a dentist

## 2018-11-08 NOTE — Assessment & Plan Note (Signed)
Lung nodule in the right upper lobe needs further follow-up with active smoking  Repeat CT scan of the chest will be obtained
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
## 2018-11-08 NOTE — Assessment & Plan Note (Signed)
Essential hypertension currently well controlled at this time and not on particular Lee any medication  Will observe for now

## 2018-11-08 NOTE — Assessment & Plan Note (Signed)
Seizure disorder  Continue Keppra per neurology and follow-up with neurology's recommendations to evaluate the patient's memory loss which may be related to [place] but on the other hand may be a form of dementia.

## 2018-11-08 NOTE — Assessment & Plan Note (Signed)
History of emphysema of the lung currently actively smoking  I spent about 10 minutes going through smoking cessation counseling with this patient

## 2018-11-08 NOTE — Patient Instructions (Signed)
Please get your MRI done of your brain as scheduled and keep your neurocognitive testing appointments  Please stay on your Keppra twice daily  Focus on tobacco cessation  No change in your other medications  Repeat CT scan of the chest to follow-up your lung nodule and also lumbar x-rays will be obtained  A Pneumovax was given for pneumonia vaccine  We will obtain a hepatitis C screen and also give you a stool card to check for blood in the stool for colon cancer screening  A list of dentists were given to you please make a dental appointment to have your teeth pulled and coordinate that with the warfarin clinic  Return to see Dr. Margarita Rana in 2 months to further establish her primary care

## 2018-11-08 NOTE — Assessment & Plan Note (Signed)
Begin smoking cessation counseling was given to the patient

## 2018-11-09 LAB — DEMENTIA PANEL
Homocysteine: 12.8 umol/L (ref 0.0–17.2)
RPR Ser Ql: NONREACTIVE
TSH: 1.03 u[IU]/mL (ref 0.450–4.500)
Vitamin B-12: 731 pg/mL (ref 232–1245)

## 2018-11-09 LAB — LEVETIRACETAM LEVEL: Levetiracetam Lvl: 23.8 ug/mL (ref 10.0–40.0)

## 2018-11-09 LAB — HEPATITIS C ANTIBODY: Hep C Virus Ab: <0.1 s/co ratio (ref 0.0–0.9)

## 2018-11-12 NOTE — Progress Notes (Signed)
Made any corrections needed, and agree with history, physical, neuro exam,assessment and plan as stated.     Press Casale, MD [facility]  

## 2018-11-15 ENCOUNTER — Telehealth: Payer: Self-pay

## 2018-11-15 NOTE — Telephone Encounter (Signed)
DMV form copy given to sister, ST disability form ([employer]) completed and given to MR to fax.

## 2018-11-15 NOTE — Telephone Encounter (Signed)
Called to inform the Pt/relative that the pt lab results are normal. Pts sister inquired about the referral to MRI.

## 2018-11-15 NOTE — Telephone Encounter (Signed)
Called pt/his sister on [place]. Returned the call to her question about Referral to MRI. MRI has been trying to reach her/the patient. To give [facility] imaging a call.

## 2018-11-16 ENCOUNTER — Telehealth: Payer: Self-pay

## 2018-11-16 NOTE — Telephone Encounter (Signed)
-----   Message from Elsie Stain, MD sent at 11/09/2018  4:46 PM EDT ----- Pls let Patrick Kelly know his Hep C was NEG

## 2018-11-16 NOTE — Telephone Encounter (Signed)
Patient name and DOB has been verified Patient was informed of lab results. Patient had no questions.  

## 2018-11-19 ENCOUNTER — Other Ambulatory Visit: Payer: Self-pay | Admitting: Critical Care Medicine

## 2018-11-19 ENCOUNTER — Other Ambulatory Visit: Payer: Self-pay

## 2018-11-19 ENCOUNTER — Ambulatory Visit (HOSPITAL_COMMUNITY)
Admission: RE | Admit: 2018-11-19 | Discharge: 2018-11-19 | Disposition: A | Discharge status: Home / Self Care | Payer: Medicare Other | Source: Ambulatory Visit | Attending: Critical Care Medicine | Admitting: Critical Care Medicine

## 2018-11-19 DIAGNOSIS — G8929 Other chronic pain: Secondary | ICD-10-CM | POA: Insufficient documentation

## 2018-11-19 DIAGNOSIS — M545 Low back pain, unspecified: Secondary | ICD-10-CM

## 2018-11-19 DIAGNOSIS — S32020A Wedge compression fracture of second lumbar vertebra, initial encounter for closed fracture: Secondary | ICD-10-CM | POA: Diagnosis not present

## 2018-11-19 DIAGNOSIS — R918 Other nonspecific abnormal finding of lung field: Secondary | ICD-10-CM | POA: Diagnosis not present

## 2018-11-19 DIAGNOSIS — J439 Emphysema, unspecified: Secondary | ICD-10-CM | POA: Diagnosis not present

## 2018-11-19 DIAGNOSIS — S32010A Wedge compression fracture of first lumbar vertebra, initial encounter for closed fracture: Secondary | ICD-10-CM | POA: Diagnosis not present

## 2018-11-19 DIAGNOSIS — R911 Solitary pulmonary nodule: Secondary | ICD-10-CM | POA: Diagnosis not present

## 2018-11-19 NOTE — Telephone Encounter (Signed)
When you get a chance can you put a new MRI order in for [facility].

## 2018-11-19 NOTE — Telephone Encounter (Signed)
STD forms given to DS in MR 11-15-18.  Can you call sister and let her know please.

## 2018-11-19 NOTE — Telephone Encounter (Signed)
Faxed pacemaker info to [facility] if it is MRI compatible to reach out to the patient to schedule.

## 2018-11-19 NOTE — Telephone Encounter (Signed)
New order placed to undergo MRI at [facility] due to pacemaker.  Disability forms were completed and placed on desk of [place], [place].

## 2018-11-19 NOTE — Telephone Encounter (Signed)
Pt's sister Drema Balzarine (on [place]) called to f/u on the MRI order. She stated she spoke with GI extensively on 9/10. Pt cannot have MRI d/t pacemaker and it will need to be sent to a hospital for completion. She also asked if the ST disability forms that were completed on 11/15/2018 were faxed to [place] yet. She would like a call back to confirm. She understands the office was closed on Friday. She verbalized appreciation.

## 2018-11-19 NOTE — Addendum Note (Signed)
Addended by: Mal Misty on: 11/19/2018 12:07 PM   Modules accepted: Orders

## 2018-11-20 ENCOUNTER — Telehealth: Payer: Self-pay | Admitting: Critical Care Medicine

## 2018-11-20 DIAGNOSIS — S32020A Wedge compression fracture of second lumbar vertebra, initial encounter for closed fracture: Secondary | ICD-10-CM

## 2018-11-20 HISTORY — DX: Wedge compression fracture of second lumbar vertebra, initial encounter for closed fracture: S32.020A

## 2018-11-20 NOTE — Telephone Encounter (Signed)
I spoke to sister about CT chest results and lumbar spine films  Will refer to Dr Melina Schools of [facility] for lumbar disease  ? May need Vit D /calcium supp?  Please call sister with appt information as pt has memory issues

## 2018-11-20 NOTE — Telephone Encounter (Signed)
Patient MRI is scheduled at [facility] for 12/07/18.

## 2018-11-21 NOTE — Telephone Encounter (Signed)
Informed patient sister Bethena Roys with the name of the doctor at [facility] for her to also call them to sch appt for patient.

## 2018-11-22 ENCOUNTER — Telehealth: Payer: Self-pay | Admitting: Family Medicine

## 2018-11-22 NOTE — Telephone Encounter (Signed)
Referral has not been done yet patient will be called once referral is complete.

## 2018-11-22 NOTE — Telephone Encounter (Signed)
Patient sister Bethena Roys called to check on the status of patients referral for [facility]. Please follow up.

## 2018-11-23 ENCOUNTER — Other Ambulatory Visit: Payer: Self-pay

## 2018-11-23 ENCOUNTER — Ambulatory Visit (INDEPENDENT_AMBULATORY_CARE_PROVIDER_SITE_OTHER): Payer: Medicare Other | Admitting: *Deleted

## 2018-11-23 DIAGNOSIS — I480 Paroxysmal atrial fibrillation: Secondary | ICD-10-CM

## 2018-11-23 DIAGNOSIS — Z5181 Encounter for therapeutic drug level monitoring: Secondary | ICD-10-CM

## 2018-11-23 LAB — POCT INR: INR: 3.3 — AB (ref 2.0–3.0)

## 2018-11-23 NOTE — Patient Instructions (Signed)
Description    Hold tomorrow, then continue on same dosage 1/2 tablet daily except 1 tablet on Mondays, Wednesdays, and Fridays. Be consistent with your green vegetable intake. Recheck INR 3 weeks. Call Coumadin clinic with new medications [phone]

## 2018-12-07 ENCOUNTER — Ambulatory Visit (HOSPITAL_COMMUNITY)
Admission: RE | Admit: 2018-12-07 | Discharge: 2018-12-07 | Disposition: A | Discharge status: Home / Self Care | Payer: Medicare Other | Source: Ambulatory Visit | Attending: Adult Health | Admitting: Adult Health

## 2018-12-07 ENCOUNTER — Other Ambulatory Visit: Payer: Self-pay

## 2018-12-07 DIAGNOSIS — R413 Other amnesia: Secondary | ICD-10-CM | POA: Diagnosis not present

## 2018-12-07 LAB — CREATININE, SERUM
Creatinine, Ser: 0.95 mg/dL (ref 0.61–1.24)
GFR calc Af Amer: >60 mL/min (ref >60)
GFR calc non Af Amer: >60 mL/min (ref >60)

## 2018-12-07 MED ORDER — GADOBUTROL 1 MMOL/ML IV SOLN
7.0000 mL | Freq: Once | INTRAVENOUS | Status: AC | PRN
  Administered 2018-12-07: 7 mL via INTRAVENOUS

## 2018-12-10 ENCOUNTER — Other Ambulatory Visit: Payer: Self-pay | Admitting: *Deleted

## 2018-12-10 ENCOUNTER — Telehealth: Payer: Self-pay | Admitting: *Deleted

## 2018-12-10 DIAGNOSIS — R413 Other amnesia: Secondary | ICD-10-CM

## 2018-12-10 DIAGNOSIS — R419 Unspecified symptoms and signs involving cognitive functions and awareness: Secondary | ICD-10-CM

## 2018-12-10 DIAGNOSIS — R569 Unspecified convulsions: Secondary | ICD-10-CM

## 2018-12-10 NOTE — Telephone Encounter (Signed)
-----   Message from Frann Rider, NP sent at 12/10/2018  8:36 AM EDT ----- Please advise patient that his recent imaging did not show any new abnormalities

## 2018-12-10 NOTE — Telephone Encounter (Signed)
I spoke to [facility] Neurology and Dr. Hazle Coca is seeing new pts depending on pts insurance  (01/07/19).  I placed referral, this appt would replace Dr. Harrel Carina appt in 03/2019.  I called Bethena Roys, sister of pt and she was informed that pt was already called about the appt.  She is aware that this is replacement.  She still would like to have Janett Billow or Dr. Jaynee Eagles call her and discuss with her all of the issues that are going on.  (relating to LTD, etc)

## 2018-12-10 NOTE — Telephone Encounter (Signed)
I called pts sister, Bethena Roys, [place].   I gave  the MRI results to her (basically no change from previous MRI done  No 2019).  No abnormalities.  She verbalized understanding.  She then relayed that STD is 8 wk into 11wks timeframe).  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  He is now unemployed. He will be filing for [employer].  She is asking to have this done so no gap in income.  He would need to be seen mid oct of can schedule.  He is now scheduled for 01/29/19.  Psych appt in 03/2019.  Please advise.

## 2018-12-10 NOTE — Telephone Encounter (Signed)
Will need formal evaluation by neuropsychology for formal diagnosis of potential cognitive impairment prior to filling out any type of LTD from a neurological perspective as subjective cognitive impairment/memory loss may not be neurologic etiology.  Recommended speaking with his PCP in regards to [facility]

## 2018-12-11 NOTE — Addendum Note (Signed)
Addended by: Mal Misty on: 12/11/2018 07:44 AM   Modules accepted: Orders

## 2018-12-11 NOTE — Telephone Encounter (Signed)
Spoke to patient's sister, Patrick Kelly, on [place].  She is not aware of any reoccurring episodes consisting of zoning out with possible loss of consciousness as described at prior visit.  He has been consistently taking his Keppra 500 mg twice daily.  Memory/cognition has been stable without any worsening.  Per sister, he is not eligible for unemployment after his recent employer/company closed down as he was on STD during this time.  His employer and insurance company advised to pursue LTD due to this.  Advised sister that he was placed on STD through this office due to difficulty performing job adequately and until further evaluation could be obtained such as MRI and neuropsych evaluation with possible improvement of the symptoms with ongoing compliance of Keppra in hopes of being able to return to his job.  Advised sister that we will not be able to provide any type of LTD at this time from a neurological perspective until formal neuropsych evaluation is completed.  Also spoke about evaluation by epileptologist for further evaluation of these episodes and possible need of more long-term monitoring.  She was agreeable to this referral which will be placed to [facility] neurology Dr. Delice Lesch.  I did advise her to speak to PCP in regards to LTD.  She verbalized understanding and appreciation for phone call.  Advised her to notify office with any additional episodes and may consider increasing Keppra dosage.

## 2018-12-14 ENCOUNTER — Ambulatory Visit (INDEPENDENT_AMBULATORY_CARE_PROVIDER_SITE_OTHER): Payer: Medicare Other

## 2018-12-14 ENCOUNTER — Other Ambulatory Visit: Payer: Self-pay

## 2018-12-14 DIAGNOSIS — Z5181 Encounter for therapeutic drug level monitoring: Secondary | ICD-10-CM

## 2018-12-14 DIAGNOSIS — I480 Paroxysmal atrial fibrillation: Secondary | ICD-10-CM | POA: Diagnosis not present

## 2018-12-14 LAB — POCT INR: INR: 2.4 (ref 2.0–3.0)

## 2018-12-14 NOTE — Patient Instructions (Signed)
Description   Continue on same dosage 1/2 tablet daily except 1 tablet on Mondays, Wednesdays, and Fridays. Be consistent with your green vegetable intake. Recheck INR 4 weeks. Call Coumadin clinic with new medications #[phone]      

## 2018-12-18 DIAGNOSIS — M4310 Spondylolisthesis, site unspecified: Secondary | ICD-10-CM | POA: Diagnosis not present

## 2018-12-18 DIAGNOSIS — M4856XA Collapsed vertebra, not elsewhere classified, lumbar region, initial encounter for fracture: Secondary | ICD-10-CM | POA: Diagnosis not present

## 2018-12-18 DIAGNOSIS — M545 Low back pain: Secondary | ICD-10-CM | POA: Diagnosis not present

## 2018-12-19 ENCOUNTER — Ambulatory Visit (INDEPENDENT_AMBULATORY_CARE_PROVIDER_SITE_OTHER): Payer: Medicare Other | Admitting: *Deleted

## 2018-12-19 DIAGNOSIS — I495 Sick sinus syndrome: Secondary | ICD-10-CM

## 2018-12-19 DIAGNOSIS — I480 Paroxysmal atrial fibrillation: Secondary | ICD-10-CM

## 2018-12-20 LAB — CUP PACEART REMOTE DEVICE CHECK
Battery Remaining Longevity: 155 mo
Battery Voltage: 3.03 V
Brady Statistic AP VP Percent: 0 %
Brady Statistic AP VS Percent: 2.86 %
Brady Statistic AS VP Percent: 0.03 %
Brady Statistic AS VS Percent: 97.11 %
Brady Statistic RA Percent Paced: 2.86 %
Brady Statistic RV Percent Paced: 0.03 %
Date Time Interrogation Session: 20201014050006
Implantable Lead Implant Date: 20180410
Implantable Lead Implant Date: 20180410
Implantable Lead Location: 753859
Implantable Lead Location: 753860
Implantable Lead Model: 3830
Implantable Lead Model: 5076
Implantable Pulse Generator Implant Date: 20180410
Lead Channel Impedance Value: 285 Ohm
Lead Channel Impedance Value: 304 Ohm
Lead Channel Impedance Value: 361 Ohm
Lead Channel Impedance Value: 494 Ohm
Lead Channel Pacing Threshold Amplitude: 0.375 V
Lead Channel Pacing Threshold Amplitude: 2 V
Lead Channel Pacing Threshold Pulse Width: 0.4 ms
Lead Channel Pacing Threshold Pulse Width: 0.4 ms
Lead Channel Sensing Intrinsic Amplitude: 2.75 mV
Lead Channel Sensing Intrinsic Amplitude: 2.75 mV
Lead Channel Sensing Intrinsic Amplitude: 8.625 mV
Lead Channel Sensing Intrinsic Amplitude: 8.625 mV
Lead Channel Setting Pacing Amplitude: 1.5 V
Lead Channel Setting Pacing Amplitude: 2 V
Lead Channel Setting Pacing Pulse Width: 1 ms
Lead Channel Setting Sensing Sensitivity: 2 mV

## 2018-12-26 ENCOUNTER — Ambulatory Visit: Payer: Medicare Other | Attending: Family Medicine | Admitting: Family Medicine

## 2018-12-26 ENCOUNTER — Other Ambulatory Visit: Payer: Self-pay

## 2018-12-26 ENCOUNTER — Encounter: Payer: Self-pay | Admitting: Family Medicine

## 2018-12-26 VITALS — BP 141/92 | HR 82 | Temp 98.0°F | Ht 70.0 in | Wt 169.2 lb

## 2018-12-26 DIAGNOSIS — Z950 Presence of cardiac pacemaker: Secondary | ICD-10-CM | POA: Insufficient documentation

## 2018-12-26 DIAGNOSIS — R569 Unspecified convulsions: Secondary | ICD-10-CM

## 2018-12-26 DIAGNOSIS — R5383 Other fatigue: Secondary | ICD-10-CM | POA: Insufficient documentation

## 2018-12-26 DIAGNOSIS — Z7901 Long term (current) use of anticoagulants: Secondary | ICD-10-CM | POA: Insufficient documentation

## 2018-12-26 DIAGNOSIS — I480 Paroxysmal atrial fibrillation: Secondary | ICD-10-CM | POA: Insufficient documentation

## 2018-12-26 DIAGNOSIS — G40909 Epilepsy, unspecified, not intractable, without status epilepticus: Secondary | ICD-10-CM | POA: Diagnosis not present

## 2018-12-26 DIAGNOSIS — Z79899 Other long term (current) drug therapy: Secondary | ICD-10-CM | POA: Insufficient documentation

## 2018-12-26 DIAGNOSIS — R413 Other amnesia: Secondary | ICD-10-CM | POA: Diagnosis not present

## 2018-12-26 DIAGNOSIS — M25551 Pain in right hip: Secondary | ICD-10-CM | POA: Insufficient documentation

## 2018-12-26 DIAGNOSIS — S32020D Wedge compression fracture of second lumbar vertebra, subsequent encounter for fracture with routine healing: Secondary | ICD-10-CM

## 2018-12-26 DIAGNOSIS — Z1321 Encounter for screening for nutritional disorder: Secondary | ICD-10-CM | POA: Diagnosis not present

## 2018-12-26 DIAGNOSIS — E559 Vitamin D deficiency, unspecified: Secondary | ICD-10-CM | POA: Diagnosis not present

## 2018-12-26 DIAGNOSIS — I10 Essential (primary) hypertension: Secondary | ICD-10-CM | POA: Insufficient documentation

## 2018-12-26 NOTE — Patient Instructions (Signed)

## 2018-12-26 NOTE — Progress Notes (Signed)
Patient is having pain in right hips and would like an xray.
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
## 2018-12-27 ENCOUNTER — Telehealth: Payer: Self-pay

## 2018-12-27 LAB — VITAMIN D 25 HYDROXY (VIT D DEFICIENCY, FRACTURES): Vit D, 25-Hydroxy: 19.4 ng/mL — ABNORMAL LOW (ref 30.0–100.0)

## 2018-12-27 MED ORDER — ERGOCALCIFEROL 1.25 MG (50000 UT) PO CAPS: 50000.0000 [IU] | ORAL_CAPSULE | ORAL | 1 refills | Status: DC

## 2018-12-27 NOTE — Telephone Encounter (Signed)
-----   Message from Charlott Rakes, MD sent at 12/27/2018  9:30 AM EDT ----- Vitamin D level is low which could explain his fatigue and I have sent a script to the pharmacy for replacement

## 2018-12-27 NOTE — Telephone Encounter (Signed)
Patient name and DOB has been verified Patient was informed of lab results. Patient had no questions.  

## 2018-12-28 ENCOUNTER — Telehealth: Payer: Self-pay

## 2018-12-28 ENCOUNTER — Ambulatory Visit (HOSPITAL_COMMUNITY)
Admission: RE | Admit: 2018-12-28 | Discharge: 2018-12-28 | Disposition: A | Discharge status: Home / Self Care | Payer: Medicare Other | Source: Ambulatory Visit | Attending: Family Medicine | Admitting: Family Medicine

## 2018-12-28 ENCOUNTER — Other Ambulatory Visit (HOSPITAL_COMMUNITY): Payer: Self-pay | Admitting: Family Medicine

## 2018-12-28 ENCOUNTER — Other Ambulatory Visit: Payer: Self-pay

## 2018-12-28 DIAGNOSIS — M25551 Pain in right hip: Secondary | ICD-10-CM | POA: Insufficient documentation

## 2018-12-28 DIAGNOSIS — M25552 Pain in left hip: Secondary | ICD-10-CM | POA: Diagnosis not present

## 2018-12-28 NOTE — Telephone Encounter (Signed)
Patient name and DOB has been verified Patient was informed of lab results. Patient had no questions.  

## 2018-12-28 NOTE — Telephone Encounter (Signed)
-----   Message from Charlott Rakes, MD sent at 12/28/2018  1:42 PM EDT ----- Hip x-ray is  negative for fracture or dislocation, reveals no acute abnormality

## 2019-01-01 LAB — FECAL OCCULT BLOOD, IMMUNOCHEMICAL

## 2019-01-02 ENCOUNTER — Encounter: Payer: Self-pay | Admitting: Family Medicine

## 2019-01-02 ENCOUNTER — Other Ambulatory Visit: Payer: Self-pay

## 2019-01-02 ENCOUNTER — Ambulatory Visit: Payer: Medicare Other | Attending: Family Medicine | Admitting: Family Medicine

## 2019-01-02 DIAGNOSIS — Z0289 Encounter for other administrative examinations: Secondary | ICD-10-CM | POA: Diagnosis not present

## 2019-01-02 DIAGNOSIS — G40909 Epilepsy, unspecified, not intractable, without status epilepticus: Secondary | ICD-10-CM

## 2019-01-02 DIAGNOSIS — M545 Low back pain: Secondary | ICD-10-CM | POA: Diagnosis not present

## 2019-01-02 DIAGNOSIS — S32020D Wedge compression fracture of second lumbar vertebra, subsequent encounter for fracture with routine healing: Secondary | ICD-10-CM | POA: Diagnosis not present

## 2019-01-02 DIAGNOSIS — M25551 Pain in right hip: Secondary | ICD-10-CM

## 2019-01-02 NOTE — Progress Notes (Signed)
Remote pacemaker transmission.   
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
## 2019-01-02 NOTE — Progress Notes (Signed)
Patient has been called and DOB has been verified. Patient has been screened and transferred to PCP to start phone visit.     

## 2019-01-07 ENCOUNTER — Ambulatory Visit: Payer: Medicare Other | Admitting: Psychology

## 2019-01-07 ENCOUNTER — Other Ambulatory Visit: Payer: Self-pay

## 2019-01-07 ENCOUNTER — Ambulatory Visit (INDEPENDENT_AMBULATORY_CARE_PROVIDER_SITE_OTHER): Payer: Medicare Other | Admitting: Psychology

## 2019-01-07 ENCOUNTER — Encounter: Payer: Self-pay | Admitting: Psychology

## 2019-01-07 DIAGNOSIS — R569 Unspecified convulsions: Secondary | ICD-10-CM | POA: Diagnosis not present

## 2019-01-07 DIAGNOSIS — R419 Unspecified symptoms and signs involving cognitive functions and awareness: Secondary | ICD-10-CM

## 2019-01-07 DIAGNOSIS — R4189 Other symptoms and signs involving cognitive functions and awareness: Secondary | ICD-10-CM | POA: Diagnosis not present
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
## 2019-01-07 NOTE — Progress Notes (Signed)
   Neuropsychology Note   IMER FOXWORTH completed 125 minutes of neuropsychological testing with technician, Milana Kidney, B.S., under the supervision of Dr. Christia Reading, Ph.D., licensed neuropsychologist. The patient did not appear overtly distressed by the testing session, per behavioral observation or via self-report to the technician. Rest breaks were offered.    In considering the patient's current level of functioning, level of presumed impairment, nature of symptoms, emotional and behavioral responses during the interview, level of literacy, and observed level of motivation/effort, a battery of tests was selected and communicated to the psychometrician.   Communication between the psychologist and technician was ongoing throughout the testing session and changes were made as deemed necessary based on patient performance on testing, technician observations and additional pertinent factors such as those listed above.   Leonie Green will return within approximately two weeks for an interactive feedback session with Dr. Melvyn Novas at which time his test performances, clinical impressions, and treatment recommendations will be reviewed in detail. The patient understands he can contact our office should he require our assistance before this time.   Full report to follow.  125 minutes were spent face-to-face with patient administering standardized tests and 15 minutes were spent scoring (technician). [CPT [id number], [id number]]

## 2019-01-09 NOTE — Progress Notes (Signed)
Thank you for this in-depth evaluation and recommendations Janett Billow, NP

## 2019-01-11 ENCOUNTER — Ambulatory Visit (INDEPENDENT_AMBULATORY_CARE_PROVIDER_SITE_OTHER): Payer: Medicare Other | Admitting: *Deleted

## 2019-01-11 ENCOUNTER — Other Ambulatory Visit: Payer: Self-pay

## 2019-01-11 DIAGNOSIS — Z5181 Encounter for therapeutic drug level monitoring: Secondary | ICD-10-CM

## 2019-01-11 DIAGNOSIS — I480 Paroxysmal atrial fibrillation: Secondary | ICD-10-CM | POA: Diagnosis not present

## 2019-01-11 LAB — POCT INR: INR: 3 (ref 2.0–3.0)

## 2019-01-11 NOTE — Patient Instructions (Signed)
Description   Continue on same dosage 1/2 tablet daily except 1 tablet on Mondays, Wednesdays, and Fridays. Be consistent with your green vegetable intake. Recheck INR 5 weeks. Call Coumadin clinic with new medications [phone]

## 2019-01-21 ENCOUNTER — Ambulatory Visit (INDEPENDENT_AMBULATORY_CARE_PROVIDER_SITE_OTHER): Payer: Medicare Other | Admitting: Psychology

## 2019-01-21 ENCOUNTER — Other Ambulatory Visit: Payer: Self-pay

## 2019-01-21 ENCOUNTER — Encounter: Payer: Self-pay | Admitting: Psychology

## 2019-01-21 DIAGNOSIS — R569 Unspecified convulsions: Secondary | ICD-10-CM

## 2019-01-21 DIAGNOSIS — R4189 Other symptoms and signs involving cognitive functions and awareness: Secondary | ICD-10-CM

## 2019-01-21 NOTE — Progress Notes (Signed)
   Neuropsychology Feedback Session Patrick Kelly. [facility] Department of Neurology  Reason for Referral:   Patrick Kelly a 68 y.o. Caucasian male referred by Sheliah Hatch, to characterize hiscurrent cognitive functioning and assist with diagnostic clarity and treatment planning in the context of a seizure disorder and subjective cognitive dysfunction.  Feedback:   Patrick Kelly completed a comprehensive neuropsychological evaluation on 01/07/2019. Please refer to that encounter for the full report. Briefly, results suggested neuropsychological functioning largely within normal limits. A noted weakness was exhibited across cognitive flexibility; however, other aspects of executive functioning were generally within normal limits. Additional performance variability/relative weaknesses were noted across visual encoding (i.e., learning) of novel information, and retrieval aspects across a single verbal memory (list learning) task. It certainly is possible that the primary etiology of Patrick Kelly relative weaknesses across cognitive testing are related to his seizure activity. Given the likely complex-partial seizure type, it is also possible that these may be occurring more frequently than Patrick Kelly realizes, creating additional cognitive and functional difficulties. Recommendations included ambulatory EEG studies (if not already performed) and assurance of appropriate medication adherence.  Patrick Kelly was accompanied by his sister. Content of the current session focused on the results of the current evaluation. Patrick Kelly was given the opportunity to ask questions and his questions were answered. He was also encouraged to reach out should additional questions arise. A copy of his report was provided at the conclusion of the visit.      A total of 20 minutes were spent with Patrick Kelly and his sister during the current feedback session.

## 2019-01-29 ENCOUNTER — Ambulatory Visit (INDEPENDENT_AMBULATORY_CARE_PROVIDER_SITE_OTHER): Payer: Medicare Other | Admitting: Adult Health

## 2019-01-29 ENCOUNTER — Other Ambulatory Visit: Payer: Self-pay

## 2019-01-29 ENCOUNTER — Encounter: Payer: Self-pay | Admitting: Adult Health

## 2019-01-29 VITALS — BP 136/72 | HR 70 | Temp 97.7°F | Ht 67.0 in | Wt 166.4 lb

## 2019-01-29 DIAGNOSIS — M4856XD Collapsed vertebra, not elsewhere classified, lumbar region, subsequent encounter for fracture with routine healing: Secondary | ICD-10-CM | POA: Diagnosis not present

## 2019-01-29 DIAGNOSIS — R569 Unspecified convulsions: Secondary | ICD-10-CM

## 2019-01-29 NOTE — Patient Instructions (Signed)
Your Plan:  Continue Keppra 500 mg twice daily  Please notify office with any additional episodes of "zoning out" episodes or possible seizure activity  Please continue to follow with your PCP regarding disability.  Prior episodes and cognitive concerns likely related to seizure activity and not taking Keppra as prescribed.  Please ensure you continue to take Keppra as prescribed.  No indication from a neurological standpoint to provide disability at this time.  No driving at least until February due to [facility] law as long as no additional seizure activities have occurred    Follow-up in 6 months or call earlier if needed       Thank you for coming to see [place] at [facility]. I hope we have been able to provide you high quality care today.  You may receive a patient satisfaction survey over the next few weeks. We would appreciate your feedback and comments so that we may continue to improve ourselves and the health of our patients.
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
## 2019-01-30 ENCOUNTER — Telehealth: Payer: Self-pay | Admitting: Adult Health

## 2019-01-30 NOTE — Telephone Encounter (Signed)
Bethena Roys called to inform that the [facility] has suspended the patients license due to him having seizures and will need a neurologist to fill out paperwork that will allow him to be reinstated for his license. States that his 6 month suspension is over by feb 6th. And would like to know if paperwork can be dropped off or if an appointment is needed. Please follow up.

## 2019-01-30 NOTE — Telephone Encounter (Signed)
No need for appt at this time unless any concerns around Feb. Will discuss with patient via telephone regarding how he is doing prior to return to driving or paperwork filled out

## 2019-01-30 NOTE — Telephone Encounter (Signed)
I called sister of pt.  She is asking about the DMV form that is required for pt when after 6 months he can drive.  I relayed that DL-78 (made copy and in file under DMv form) that can be filled out 6 months from last seizure.  He does not necessarily need an appt for this.  Last seen  01-29-19 no seizures.  Taking his keppra as ordered.   She will call 04-11-19 to let us know about form being filled out.  (no release needed as had previous one filled out (release on file). She was ok with this.

## 2019-02-15 ENCOUNTER — Other Ambulatory Visit: Payer: Self-pay

## 2019-02-15 ENCOUNTER — Ambulatory Visit (INDEPENDENT_AMBULATORY_CARE_PROVIDER_SITE_OTHER): Payer: Medicare Other | Admitting: *Deleted

## 2019-02-15 DIAGNOSIS — I480 Paroxysmal atrial fibrillation: Secondary | ICD-10-CM

## 2019-02-15 DIAGNOSIS — Z5181 Encounter for therapeutic drug level monitoring: Secondary | ICD-10-CM

## 2019-02-15 LAB — POCT INR: INR: 3.2 — AB (ref 2.0–3.0)

## 2019-02-15 NOTE — Patient Instructions (Signed)
Description   Hold tomorrow's dose then continue on same dosage 1/2 tablet daily except 1 tablet on Mondays, Wednesdays, and Fridays. Be consistent with your green vegetable intake. Recheck INR 5 weeks. Call Coumadin clinic with new medications [phone]

## 2019-02-27 ENCOUNTER — Other Ambulatory Visit: Payer: Self-pay | Admitting: Orthopedic Surgery

## 2019-02-27 DIAGNOSIS — M4856XD Collapsed vertebra, not elsewhere classified, lumbar region, subsequent encounter for fracture with routine healing: Secondary | ICD-10-CM

## 2019-03-04 ENCOUNTER — Telehealth: Payer: Self-pay | Admitting: *Deleted

## 2019-03-04 ENCOUNTER — Telehealth: Payer: Self-pay | Admitting: Nurse Practitioner

## 2019-03-04 NOTE — Telephone Encounter (Signed)
   [facility] Pre-operative Risk Assessment    Request for surgical clearance:  1. What type of surgery is being performed? MYELOGRAM   2. When is this surgery scheduled? TBD   3. What type of clearance is required (medical clearance vs. Pharmacy clearance to hold med vs. Both)? BOTH  4. Are there any medications that need to be held prior to surgery and how long? COUMADIN X 4 DAYS PRIOR   5. Practice name and name of physician performing surgery? [facility]   6. What is your office phone number [phone]    7.   What is your office fax number [phone]  8.   Anesthesia type (None, local, MAC, general) ? LOCAL   Julaine Hua 03/04/2019, 12:20 PM  _________________________________________________________________   (provider comments below)

## 2019-03-04 NOTE — Telephone Encounter (Signed)
Phone call to patient to verify medication list and allergies for myelogram procedure. Pt aware he will need to hold his coumadin for this procedure, pending approval and recommended hold time from his provider Dr. Lovena Le. Pt verbalized understanding. Pre and post procedure instructions reviewed with pt. Thinner hold request faxed to Dr. Lovena Le, awaiting reply.

## 2019-03-04 NOTE — Telephone Encounter (Signed)
Patient with diagnosis of afib on warfarin for anticoagulation.    Procedure: MYELOGRAM  Date of procedure: TBD  CHADS2-VASc score of  3 ( HTN, AGE,  PVD)  Per office protocol, patient can hold warfarin for 5 days prior to procedure.    Patient will NOT need bridging with Lovenox (enoxaparin) around procedure.

## 2019-03-04 NOTE — Telephone Encounter (Signed)
   Primary Cardiologist: Particia Lather  Chart reviewed as part of pre-operative protocol coverage. I spoke with the patient's sister who handles his affairs. Given past medical history and time since last visit, based on ACC/AHA guidelines, Patrick Kelly would be at acceptable risk for the planned procedure without further cardiovascular testing.   Our pharmacy recommends holding his Coumadin 5 days pre op and I relayed that information to the patient's sister.   I will route this recommendation to the requesting party via Epic fax function and remove from pre-op pool.  Please call with questions.  Kerin Ransom, PA-C 03/04/2019, 2:08 PM

## 2019-03-07 ENCOUNTER — Other Ambulatory Visit (HOSPITAL_COMMUNITY): Payer: Self-pay | Admitting: Orthopedic Surgery

## 2019-03-07 DIAGNOSIS — M545 Low back pain, unspecified: Secondary | ICD-10-CM

## 2019-03-12 ENCOUNTER — Other Ambulatory Visit: Payer: Medicare Other

## 2019-03-14 ENCOUNTER — Telehealth: Payer: Self-pay | Admitting: *Deleted

## 2019-03-14 NOTE — Telephone Encounter (Signed)
Received form from insurance co.  Per JM/NP note and speaking to JM/NP,  from neuro standpoint no disabillty from our end. I sent form back to MR, Stanton Kidney).

## 2019-03-18 ENCOUNTER — Other Ambulatory Visit: Payer: Self-pay

## 2019-03-18 ENCOUNTER — Ambulatory Visit (HOSPITAL_COMMUNITY)
Admission: RE | Admit: 2019-03-18 | Discharge: 2019-03-18 | Disposition: A | Discharge status: Home / Self Care | Payer: Medicare Other | Source: Ambulatory Visit | Attending: Orthopedic Surgery | Admitting: Orthopedic Surgery

## 2019-03-18 ENCOUNTER — Ambulatory Visit (INDEPENDENT_AMBULATORY_CARE_PROVIDER_SITE_OTHER): Payer: Medicare Other | Admitting: Pharmacist Clinician (PhC)/ Clinical Pharmacy Specialist

## 2019-03-18 DIAGNOSIS — Z5181 Encounter for therapeutic drug level monitoring: Secondary | ICD-10-CM | POA: Diagnosis not present

## 2019-03-18 DIAGNOSIS — M545 Low back pain, unspecified: Secondary | ICD-10-CM

## 2019-03-18 DIAGNOSIS — I480 Paroxysmal atrial fibrillation: Secondary | ICD-10-CM | POA: Diagnosis not present

## 2019-03-18 LAB — POCT INR: INR: 2 (ref 2.0–3.0)

## 2019-03-20 ENCOUNTER — Ambulatory Visit (INDEPENDENT_AMBULATORY_CARE_PROVIDER_SITE_OTHER): Payer: Medicare Other | Admitting: *Deleted

## 2019-03-20 DIAGNOSIS — I480 Paroxysmal atrial fibrillation: Secondary | ICD-10-CM

## 2019-03-20 LAB — CUP PACEART REMOTE DEVICE CHECK
Battery Remaining Longevity: 152 mo
Battery Voltage: 3.02 V
Brady Statistic AP VP Percent: 0 %
Brady Statistic AP VS Percent: 0.3 %
Brady Statistic AS VP Percent: 0.04 %
Brady Statistic AS VS Percent: 99.66 %
Brady Statistic RA Percent Paced: 0.3 %
Brady Statistic RV Percent Paced: 0.04 %
Date Time Interrogation Session: 20210113000047
Implantable Lead Implant Date: 20180410
Implantable Lead Implant Date: 20180410
Implantable Lead Location: 753859
Implantable Lead Location: 753860
Implantable Lead Model: 3830
Implantable Lead Model: 5076
Implantable Pulse Generator Implant Date: 20180410
Lead Channel Impedance Value: 285 Ohm
Lead Channel Impedance Value: 304 Ohm
Lead Channel Impedance Value: 361 Ohm
Lead Channel Impedance Value: 475 Ohm
Lead Channel Pacing Threshold Amplitude: 0.5 V
Lead Channel Pacing Threshold Amplitude: 1.625 V
Lead Channel Pacing Threshold Pulse Width: 0.4 ms
Lead Channel Pacing Threshold Pulse Width: 0.4 ms
Lead Channel Sensing Intrinsic Amplitude: 2.875 mV
Lead Channel Sensing Intrinsic Amplitude: 2.875 mV
Lead Channel Sensing Intrinsic Amplitude: 8.75 mV
Lead Channel Sensing Intrinsic Amplitude: 8.75 mV
Lead Channel Setting Pacing Amplitude: 1.5 V
Lead Channel Setting Pacing Amplitude: 2 V
Lead Channel Setting Pacing Pulse Width: 1 ms
Lead Channel Setting Sensing Sensitivity: 2 mV

## 2019-03-21 ENCOUNTER — Telehealth: Payer: Self-pay | Admitting: Internal Medicine

## 2019-03-21 NOTE — Telephone Encounter (Signed)
  1. Has your device fired? No  2. Is you device beeping? Yes   3. Are you experiencing draining or swelling at device site? No  4. Are you calling to see if we received your device transmission? Yes  5. Have you passed out? No  Patient is calling stating he had a home remote pacer check appointment yesterday 03/20/19 and he was not able to send in reading. He states the device won't hold battery and when he tries to send in a reading it won't go all the way through and states "[id number]" with a orange blinking light then says "set back down on receiver"    Please route to Device Clinic Pool

## 2019-03-22 NOTE — Telephone Encounter (Signed)
LMOVM to let the pt know he do not have to send manual transmissions. His monitor is design to send automatically. I gave him the number to [facility] tech support so they can trouble shoot the monitor since it gave him the error coed [phone]. I also left my direct office number in case he has any questions.

## 2019-03-28 ENCOUNTER — Ambulatory Visit: Payer: Medicare Other | Admitting: Psychology

## 2019-04-09 ENCOUNTER — Other Ambulatory Visit: Payer: Self-pay | Admitting: Internal Medicine

## 2019-04-09 DIAGNOSIS — I480 Paroxysmal atrial fibrillation: Secondary | ICD-10-CM

## 2019-04-09 DIAGNOSIS — M4310 Spondylolisthesis, site unspecified: Secondary | ICD-10-CM | POA: Diagnosis not present

## 2019-04-09 DIAGNOSIS — M545 Low back pain: Secondary | ICD-10-CM | POA: Diagnosis not present

## 2019-04-09 DIAGNOSIS — M4856XD Collapsed vertebra, not elsewhere classified, lumbar region, subsequent encounter for fracture with routine healing: Secondary | ICD-10-CM | POA: Diagnosis not present

## 2019-04-09 MED ORDER — WARFARIN SODIUM 5 MG PO TABS: 5.0000 mg | ORAL_TABLET | Freq: Every day | ORAL | 0 refills | Status: DC

## 2019-04-09 NOTE — Telephone Encounter (Signed)
*  STAT* If patient is at the pharmacy, call can be transferred to refill team.   1. Which medications need to be refilled? (please list name of each medication and dose if known) warfarin (COUMADIN) 5 MG tablet  2. Which pharmacy/location (including street and city if local pharmacy) is medication to be sent to? [facility] [address], [place] [address]   3. Do they need a 30 day or 90 day supply? 90 day

## 2019-04-11 ENCOUNTER — Telehealth: Payer: Self-pay | Admitting: Adult Health

## 2019-04-11 NOTE — Telephone Encounter (Signed)
[place]tt[place],Judy has called as a reminder to [place], RN to send the form out re: pt being able to drive again since his 6 mo. Suspension ended today for not being able to drive.  Please call pt's sister

## 2019-04-15 NOTE — Telephone Encounter (Signed)
I called sister of pt.  DMV to  Be filled out , reviewed and signed.  Please let sister know when done.  Please fax form to [facility].

## 2019-04-15 NOTE — Telephone Encounter (Signed)
Received note.  Copy of DMV form to be filled out.

## 2019-04-17 ENCOUNTER — Telehealth: Payer: Self-pay | Admitting: *Deleted

## 2019-04-17 NOTE — Telephone Encounter (Signed)
To medical records. Fax to [facility], call sister when done.

## 2019-04-17 NOTE — Telephone Encounter (Signed)
Completed and placed in out box.  Thank you for assistance.

## 2019-04-17 NOTE — Telephone Encounter (Signed)
Completed DMV, to JM/NP for review and signature

## 2019-04-24 ENCOUNTER — Other Ambulatory Visit: Payer: Self-pay | Admitting: Family Medicine

## 2019-04-26 ENCOUNTER — Other Ambulatory Visit: Payer: Self-pay

## 2019-04-26 ENCOUNTER — Ambulatory Visit (INDEPENDENT_AMBULATORY_CARE_PROVIDER_SITE_OTHER): Payer: Medicare Other | Admitting: *Deleted

## 2019-04-26 DIAGNOSIS — Z5181 Encounter for therapeutic drug level monitoring: Secondary | ICD-10-CM

## 2019-04-26 DIAGNOSIS — I480 Paroxysmal atrial fibrillation: Secondary | ICD-10-CM

## 2019-04-26 LAB — POCT INR: INR: 3.5 — AB (ref 2.0–3.0)

## 2019-04-26 NOTE — Patient Instructions (Addendum)
Description   Hold tomorrow, then continue with 1/2 tablet daily except 1 tablet on Mondays, Wednesdays, and Fridays. Be consistent with your green vegetable intake. Recheck INR 3 weeks. Call Coumadin clinic with new medications #[phone]

## 2019-05-10 ENCOUNTER — Telehealth: Payer: Self-pay | Admitting: Internal Medicine

## 2019-05-10 NOTE — Telephone Encounter (Signed)
I spoke with patient. He reports swelling in feet and ankles. Is in both legs. Started about a month ago. Does not weigh daily. Swelling is present all the time.  He has an appointment next week with PCP for evaluation of swelling.  I advised him to keep this appointment and if PCP felt he needed to see cardiology for swelling to call us and appointment could be scheduled at that time.

## 2019-05-10 NOTE — Telephone Encounter (Signed)
New Message  Pt c/o swelling: STAT is pt has developed SOB within 24 hours  1) How much weight have you gained and in what time span? N/A  2) If swelling, where is the swelling located? Both Feet and Ankles  3) Are you currently taking a fluid pill? No  4) Are you currently SOB? No  5) Do you have a log of your daily weights (if so, list)? N/A  6) Have you gained 3 pounds in a day or 5 pounds in a week? N/A  7) Have you traveled recently? No

## 2019-05-15 ENCOUNTER — Ambulatory Visit: Payer: Medicare Other | Attending: Family Medicine | Admitting: Physician Assistant

## 2019-05-15 ENCOUNTER — Other Ambulatory Visit: Payer: Self-pay

## 2019-05-15 ENCOUNTER — Other Ambulatory Visit: Payer: Self-pay | Admitting: Pharmacist

## 2019-05-15 VITALS — BP 144/89 | HR 65 | Temp 96.3°F | Resp 16 | Wt 169.8 lb

## 2019-05-15 DIAGNOSIS — R609 Edema, unspecified: Secondary | ICD-10-CM | POA: Insufficient documentation

## 2019-05-15 DIAGNOSIS — Z79899 Other long term (current) drug therapy: Secondary | ICD-10-CM | POA: Insufficient documentation

## 2019-05-15 DIAGNOSIS — I480 Paroxysmal atrial fibrillation: Secondary | ICD-10-CM | POA: Insufficient documentation

## 2019-05-15 DIAGNOSIS — Z7901 Long term (current) use of anticoagulants: Secondary | ICD-10-CM | POA: Diagnosis not present

## 2019-05-15 DIAGNOSIS — J439 Emphysema, unspecified: Secondary | ICD-10-CM | POA: Diagnosis not present

## 2019-05-15 DIAGNOSIS — Z950 Presence of cardiac pacemaker: Secondary | ICD-10-CM | POA: Diagnosis not present

## 2019-05-15 DIAGNOSIS — R600 Localized edema: Secondary | ICD-10-CM | POA: Diagnosis not present

## 2019-05-15 DIAGNOSIS — I10 Essential (primary) hypertension: Secondary | ICD-10-CM | POA: Insufficient documentation

## 2019-05-15 DIAGNOSIS — I739 Peripheral vascular disease, unspecified: Secondary | ICD-10-CM | POA: Diagnosis not present

## 2019-05-15 LAB — POCT INR: INR: 2.1 (ref 2.0–3.0)

## 2019-05-15 MED ORDER — FUROSEMIDE 20 MG PO TABS: ORAL_TABLET | ORAL | 1 refills | Status: DC

## 2019-05-15 MED FILL — FUROSEMIDE 20 MG TABS: 20 | 7 days supply | Qty: 7 | Fill #0

## 2019-05-15 NOTE — Progress Notes (Signed)
Patrick Kelly, is a 69 y.o. male  KDX:[id number]  [id number]  DOB - 02-24-1951  Subjective:  Chief Complaint and HPI: Patrick Kelly is a 69 y.o. male here today with 1 month h/o B leg swelling/edema.  He has been more sedentary over the last month or 2.  He denies CP/SOB.  About 4o yrs ago, he was on daily "fluid pill" but hasn't taken in a long time.  INR 2.1 today   ROS:   Constitutional:  No f/c, No night sweats, No unexplained weight loss. EENT:  No vision changes, No blurry vision, No hearing changes. No mouth, throat, or ear problems.  Respiratory: No cough, No SOB Cardiac: No CP, no palpitations GI:  No abd pain, No N/V/D. GU: No Urinary s/sx Musculoskeletal: No joint pain Neuro: No headache, no dizziness, no motor weakness.  Skin: No rash Endocrine:  No polydipsia. No polyuria.  Psych: Denies SI/HI  No problems updated.  ALLERGIES: No Known Allergies  PAST MEDICAL HISTORY: Past Medical History:  Diagnosis Date  . Abnormal CT of the chest    emphysemetous changes suggestive copd  . Chronic bilateral low back pain without sciatica 11/08/2018  . Compression fracture of L2 lumbar vertebra (HCC) 11/20/2018  . Dental caries   . Emphysema lung (HCC) 07/12/2016  . Essential hypertension   . Multiple fractures of ribs   . PAF (paroxysmal atrial fibrillation) (HCC)   . Peripheral vascular obstructive disease (HCC)   . Pulmonary nodule   . 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 Seizure (HCC) 03/05/2018  . Sinusitis   . Syncope   . Tobacco use     MEDICATIONS AT HOME: Prior to Admission medications   Medication Sig Start Date End Date Taking? Authorizing Provider  furosemide (LASIX) 20 MG tablet 1 daily for 1 week for  swelling prn 05/15/19   Anders Simmonds, PA-C  levETIRAcetam (KEPPRA) 500 MG tablet Take 1 tablet (500 mg total) by mouth 2 (two) times daily. 10/18/18   Ihor Austin, NP  vitamin B-12 (CYANOCOBALAMIN) 1000 MCG tablet Take 1 tablet  (1,000 mcg total) by mouth daily. 10/11/18   Angelita Ingles, MD  VITAMIN D PO Take by mouth once a week.    [provider]  Vitamin D, Ergocalciferol, (DRISDOL) 1.25 MG (50000 UNIT) CAPS capsule TAKE ONE CAPSULE BY MOUTH ONCE WEEKLY 04/24/19   Hoy Register, MD  warfarin (COUMADIN) 5 MG tablet Take 1 tablet (5 mg total) by mouth daily. Take 1/2 a tablet to 1 tablet daily as directed by the Coumadin Clinic 04/09/19   [name], MD     Objective:  EXAM:   Vitals:   05/15/19 1059  BP: (!) 144/89  Pulse: 65  Resp: 16  Temp: (!) 96.3 F (35.7 C)  SpO2: 95%  Weight: 169 lb 12.8 oz (77 kg)    General appearance : A&OX3. NAD. Non-toxic-appearing HEENT: Atraumatic and Normocephalic.  PERRLA. EOM intact.   Neck: supple, no JVD. No cervical lymphadenopathy. No thyromegaly Chest/Lungs:  Breathing-non-labored, Good air entry bilaterally, breath sounds normal without rales, rhonchi, or wheezing  CVS: S1 S2 regular, no murmurs, gallops, rubs  Extremities: Bilateral Lower Ext shows 2-3+ edema, no open/broekn skin both legs are warm to touch with = pulse throughout Neurology:  CN II-XII grossly intact, Non focal.   Psych:  TP linear. J/I WNL. Normal speech. Appropriate eye contact and affect.  Skin:  No Rash  Data Review Lab Results  Component Value Date  HGBA1C 4.6 (L) 03/04/2018   HGBA1C 4.9 12/27/2017   HGBA1C 5.0 06/11/2016     Assessment & Plan   1. PAF (paroxysmal atrial fibrillation) (HCC) - INR - Comprehensive metabolic panel - Brain natriuretic peptide  2. Edema, unspecified type - Comprehensive metabolic panel - Brain natriuretic peptide - furosemide (LASIX) 20 MG tablet; 1 daily for 1 week for  swelling prn  Dispense: 7 tablet; Refill: 1  3. Peripheral vascular obstructive disease (HCC) - Brain natriuretic peptide - furosemide (LASIX) 20 MG tablet; 1 daily for 1 week for  swelling prn  Dispense: 7 tablet; Refill: 1  4. Essential  hypertension Continue current med regimen.  We have discussed target BP range and blood pressure goal. I have advised patient to check BP regularly and to call us back or report to clinic if the numbers are consistently higher than 140/90. We discussed the importance of compliance with medical therapy and DASH diet recommended, consequences of uncontrolled hypertension discussed.  - Brain natriuretic peptide     Patient have been counseled extensively about nutrition and exercise  Return in about 3 months (around 08/15/2019) for with PCP;  chronic conditions.  The patient was given clear instructions to go to ER or return to medical center if symptoms don't improve, worsen or new problems develop. The patient verbalized understanding. The patient was told to call to get lab results if they haven't heard anything in the next week.     Freeman Caldron, PA-C [facility] and [place], [place]   05/15/2019, 11:18 AMPatient ID: Patrick Kelly, male   DOB: 1950/03/20, 69 y.o.   MRN: [id number]

## 2019-05-15 NOTE — Patient Instructions (Signed)
Edema  Edema is when you have too much fluid in your body or under your skin. Edema may make your legs, feet, and ankles swell up. Swelling is also common in looser tissues, like around your eyes. This is a common condition. It gets more common as you get older. There are many possible causes of edema. Eating too much salt (sodium) and being on your feet or sitting for a long time can cause edema in your legs, feet, and ankles. Hot weather may make edema worse. Edema is usually painless. Your skin may look swollen or shiny. Follow these instructions at home:  Keep the swollen body part raised (elevated) above the level of your heart when you are sitting or lying down.  Do not sit still or stand for a long time.  Do not wear tight clothes. Do not wear garters on your upper legs.  Exercise your legs. This can help the swelling go down.  Wear elastic bandages or support stockings as told by your doctor.  Eat a low-salt (low-sodium) diet to reduce fluid as told by your doctor.  Depending on the cause of your swelling, you may need to limit how much fluid you drink (fluid restriction).  Take over-the-counter and prescription medicines only as told by your doctor. Contact a doctor if:  Treatment is not working.  You have heart, liver, or kidney disease and have symptoms of edema.  You have sudden and unexplained weight gain. Get help right away if:  You have shortness of breath or chest pain.  You cannot breathe when you lie down.  You have pain, redness, or warmth in the swollen areas.  You have heart, liver, or kidney disease and get edema all of a sudden.  You have a fever and your symptoms get worse all of a sudden. Summary  Edema is when you have too much fluid in your body or under your skin.  Edema may make your legs, feet, and ankles swell up. Swelling is also common in looser tissues, like around your eyes.  Raise (elevate) the swollen body part above the level of your  heart when you are sitting or lying down.  Follow your doctor's instructions about diet and how much fluid you can drink (fluid restriction). This information is not intended to replace advice given to you by your health care provider. Make sure you discuss any questions you have with your health care provider. Document Revised: 02/24/2017 Document Reviewed: 03/11/2016 Elsevier Patient Education  2020 Elsevier Inc.  

## 2019-05-16 LAB — BRAIN NATRIURETIC PEPTIDE: BNP: 58.7 pg/mL (ref 0.0–100.0)

## 2019-05-16 LAB — COMPREHENSIVE METABOLIC PANEL
ALT: 5 IU/L (ref 0–44)
AST: 14 IU/L (ref 0–40)
Albumin/Globulin Ratio: 2 (ref 1.2–2.2)
Albumin: 3.9 g/dL (ref 3.8–4.8)
Alkaline Phosphatase: 120 IU/L — ABNORMAL HIGH (ref 39–117)
BUN/Creatinine Ratio: 13 (ref 10–24)
BUN: 13 mg/dL (ref 8–27)
Bilirubin Total: 0.7 mg/dL (ref 0.0–1.2)
CO2: 24 mmol/L (ref 20–29)
Calcium: 9.1 mg/dL (ref 8.6–10.2)
Chloride: 102 mmol/L (ref 96–106)
Creatinine, Ser: 1 mg/dL (ref 0.76–1.27)
GFR calc Af Amer: 89 mL/min/{1.73_m2} (ref >59)
GFR calc non Af Amer: 77 mL/min/{1.73_m2} (ref >59)
Globulin, Total: 2 g/dL (ref 1.5–4.5)
Glucose: 76 mg/dL (ref 65–99)
Potassium: 4.6 mmol/L (ref 3.5–5.2)
Sodium: 138 mmol/L (ref 134–144)
Total Protein: 5.9 g/dL — ABNORMAL LOW (ref 6.0–8.5)

## 2019-05-31 ENCOUNTER — Telehealth: Payer: Self-pay | Admitting: Family Medicine

## 2019-05-31 ENCOUNTER — Telehealth: Payer: Self-pay | Admitting: Student

## 2019-05-31 ENCOUNTER — Ambulatory Visit (INDEPENDENT_AMBULATORY_CARE_PROVIDER_SITE_OTHER): Payer: Medicare Other | Admitting: *Deleted

## 2019-05-31 ENCOUNTER — Other Ambulatory Visit: Payer: Self-pay

## 2019-05-31 DIAGNOSIS — Z5181 Encounter for therapeutic drug level monitoring: Secondary | ICD-10-CM | POA: Diagnosis not present

## 2019-05-31 DIAGNOSIS — M545 Low back pain: Secondary | ICD-10-CM | POA: Diagnosis not present

## 2019-05-31 DIAGNOSIS — I480 Paroxysmal atrial fibrillation: Secondary | ICD-10-CM

## 2019-05-31 LAB — POCT INR: INR: 2.8 (ref 2.0–3.0)

## 2019-05-31 NOTE — Patient Instructions (Signed)
Description   Continue with 1/2 tablet daily except 1 tablet on Mondays, Wednesdays, and Fridays. Be consistent with your green vegetable intake. Recheck INR 4 weeks. Call Coumadin clinic with new medications #[phone]

## 2019-05-31 NOTE — Telephone Encounter (Signed)
  Presenting rhythm on carelink alert with AF with variable rate control.   Discussed with patient who is feeling well with no current complaints. He has known intermittent episodes of AF.   He states he will call back if he would like to schedule an appointment, as his sister is doing all the driving currently. Otherwise, he will keep his normal follow up and we will continue to follow remotely.   Casimiro Needle [address]" [place], PA-C  05/31/2019 11:23 AM

## 2019-05-31 NOTE — Telephone Encounter (Signed)
Chanel from [employer] called and requested for a copy of the patients long term disability paperwork. A good call back number for her is [phone], Fax #:[phone]. She requested for the claim number to be put in as the ATTN, claim number [id number]. Please follow up at your earliest convenience.

## 2019-06-03 NOTE — Telephone Encounter (Signed)
Paperwork has been faxed over to provided number.

## 2019-06-05 ENCOUNTER — Telehealth: Payer: Self-pay

## 2019-06-05 NOTE — Telephone Encounter (Signed)
Lpm informing him that he may come in earlier for appointment on 4/1.

## 2019-06-05 NOTE — Telephone Encounter (Signed)
Patient called back returning Patrick Kelly's call. I advised Patrick Kelly it was in regards to offering him a sooner appt than his original scheduled for 06/06/19 at 1 PM. Patrick Kelly stated he would prefer to keep the one he has, his sister is bringing him to the appt and worked her scheduled around 1 PM for him.

## 2019-06-06 ENCOUNTER — Ambulatory Visit (INDEPENDENT_AMBULATORY_CARE_PROVIDER_SITE_OTHER): Payer: Medicare Other | Admitting: Student

## 2019-06-06 ENCOUNTER — Encounter: Payer: Self-pay | Admitting: Student

## 2019-06-06 ENCOUNTER — Other Ambulatory Visit: Payer: Self-pay

## 2019-06-06 ENCOUNTER — Encounter (INDEPENDENT_AMBULATORY_CARE_PROVIDER_SITE_OTHER): Payer: Self-pay

## 2019-06-06 VITALS — BP 118/74 | HR 69 | Ht 67.0 in | Wt 163.8 lb

## 2019-06-06 DIAGNOSIS — I480 Paroxysmal atrial fibrillation: Secondary | ICD-10-CM | POA: Diagnosis not present

## 2019-06-06 DIAGNOSIS — I495 Sick sinus syndrome: Secondary | ICD-10-CM | POA: Diagnosis not present

## 2019-06-06 DIAGNOSIS — I10 Essential (primary) hypertension: Secondary | ICD-10-CM

## 2019-06-06 DIAGNOSIS — Z950 Presence of cardiac pacemaker: Secondary | ICD-10-CM | POA: Diagnosis not present

## 2019-06-06 LAB — CUP PACEART INCLINIC DEVICE CHECK
Battery Remaining Longevity: 149 mo
Battery Voltage: 3.02 V
Brady Statistic AP VP Percent: 0.01 %
Brady Statistic AP VS Percent: 4.37 %
Brady Statistic AS VP Percent: 0.03 %
Brady Statistic AS VS Percent: 95.59 %
Brady Statistic RA Percent Paced: 4.36 %
Brady Statistic RV Percent Paced: 0.04 %
Date Time Interrogation Session: 20210401141840
Implantable Lead Implant Date: 20180410
Implantable Lead Implant Date: 20180410
Implantable Lead Location: 753859
Implantable Lead Location: 753860
Implantable Lead Model: 3830
Implantable Lead Model: 5076
Implantable Pulse Generator Implant Date: 20180410
Lead Channel Impedance Value: 323 Ohm
Lead Channel Impedance Value: 323 Ohm
Lead Channel Impedance Value: 418 Ohm
Lead Channel Impedance Value: 532 Ohm
Lead Channel Pacing Threshold Amplitude: 0.5 V
Lead Channel Pacing Threshold Amplitude: 1.5 V
Lead Channel Pacing Threshold Pulse Width: 0.4 ms
Lead Channel Pacing Threshold Pulse Width: 0.4 ms
Lead Channel Sensing Intrinsic Amplitude: 11 mV
Lead Channel Sensing Intrinsic Amplitude: 3.125 mV
Lead Channel Sensing Intrinsic Amplitude: 4 mV
Lead Channel Sensing Intrinsic Amplitude: 7.75 mV
Lead Channel Setting Pacing Amplitude: 1.5 V
Lead Channel Setting Pacing Amplitude: 2 V
Lead Channel Setting Pacing Pulse Width: 1 ms
Lead Channel Setting Sensing Sensitivity: 2 mV

## 2019-06-06 MED ORDER — METOPROLOL TARTRATE 25 MG PO TABS: 25.0000 mg | ORAL_TABLET | Freq: Two times a day (BID) | ORAL | 3 refills | Status: DC

## 2019-06-06 NOTE — Progress Notes (Signed)
Electrophysiology Office Note Date: 06/06/2019  ID:  Patrick Kelly, DOB [birth year], MRN [id number]  PCP: Hoy Register, MD Primary Cardiologist: No primary care provider on file. Electrophysiologist: Lewayne Bunting, MD   CC: Pacemaker follow-up  Patrick Kelly is a 69 y.o. male seen today for Dr. Ladona Kelly . he presents today for add on due to variable rate control with PAF. Since last being seen in our clinic, the patient reports doing very well.  he denies chest pain, palpitations, dyspnea, PND, orthopnea, nausea, vomiting, dizziness, syncope, edema, weight gain, or early satiety. He was recently started on lasix for peripheral edema. Which has greatly improved.   Device History: Medtronic Dual Chamber PPM implanted 06/2016 for SND  Past Medical History:  Diagnosis Date  . Abnormal CT of the chest    emphysemetous changes suggestive copd  . Chronic bilateral low back pain without sciatica 11/08/2018  . Compression fracture of L2 lumbar vertebra (HCC) 11/20/2018  . Dental caries   . Emphysema lung (HCC) 07/12/2016  . Essential hypertension   . Multiple fractures of ribs   . PAF (paroxysmal atrial fibrillation) (HCC)   . Peripheral vascular obstructive disease (HCC)   . Pulmonary nodule   . 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 Seizure (HCC) 03/05/2018  . Sinusitis   . Syncope   . Tobacco use    Past Surgical History:  Procedure Laterality Date  . PACEMAKER IMPLANT N/A 06/14/2016   Procedure: Pacemaker Implant-Dual chamber;  Surgeon: [name], MD;  [facility] INVASIVE CV LAB;  Service: Cardiovascular;  Laterality: N/A;    Current Outpatient Medications  Medication Sig Dispense Refill  . furosemide (LASIX) 20 MG tablet 1 daily for 1 week for  swelling prn 7 tablet 1  . levETIRAcetam (KEPPRA) 500 MG tablet Take 1 tablet (500 mg total) by mouth 2 (two) times daily. 180 tablet 3  . vitamin B-12 (CYANOCOBALAMIN) 1000 MCG tablet Take 1 tablet (1,000 mcg total) by  mouth daily. 30 tablet 0  . VITAMIN D PO Take by mouth once a week.    . Vitamin D, Ergocalciferol, (DRISDOL) 1.25 MG (50000 UNIT) CAPS capsule TAKE ONE CAPSULE BY MOUTH ONCE WEEKLY 9 capsule 0  . warfarin (COUMADIN) 5 MG tablet Take 1 tablet (5 mg total) by mouth daily. Take 1/2 a tablet to 1 tablet daily as directed by the Coumadin Clinic 75 tablet 0  . metoprolol tartrate (LOPRESSOR) 25 MG tablet Take 1 tablet (25 mg total) by mouth 2 (two) times daily. 180 tablet 3   No current facility-administered medications for this visit.    Allergies:   Patient has no known allergies.   Social History: Social History   Socioeconomic History  . Marital status: Single    Spouse name: Not on file  . Number of children: Not on file  . Years of education: 82  . Highest education level: Bachelor's degree (e.g., BA, AB, BS)  Occupational History  . Occupation: Charity fundraiser  Tobacco Use  . Smoking status: Current Every Day Smoker    Packs/day: 1.00    Types: Cigarettes  . Smokeless tobacco: Never Used  . Tobacco comment: Does not inhale cigarette smoke  Substance and Sexual Activity  . Alcohol use: No  . Drug use: Not Currently  . Sexual activity: Not on file  Other Topics Concern  . Not on file  Social History Narrative   Live Apt alone.  Working as a Charity fundraiser.  Barista.  Social Determinants of Health   Financial Resource Strain:   . Difficulty of Paying Living Expenses:   Food Insecurity:   . Worried About Charity fundraiser in the Last Year:   . Arboriculturist in the Last Year:   Transportation Needs:   . Film/video editor (Medical):   Marland Kitchen Lack of Transportation (Non-Medical):   Physical Activity:   . Days of Exercise per Week:   . Minutes of Exercise per Session:   Stress:   . Feeling of Stress :   Social Connections:   . Frequency of Communication with Friends and Family:   . Frequency of Social Gatherings with Friends and Family:   . Attends Religious Services:    . Active Member of Clubs or Organizations:   . Attends Archivist Meetings:   Marland Kitchen Marital Status:   Intimate Partner Violence:   . Fear of Current or Ex-Partner:   . Emotionally Abused:   Marland Kitchen Physically Abused:   . Sexually Abused:     Family History: Family History  Problem Relation Age of Onset  . Hypertension Sister   . Healthy Mother   . Seizures Neg Hx      Review of Systems: All other systems reviewed and are otherwise negative except as noted above.  Physical Exam: Vitals:   06/06/19 1302  BP: 118/74  Pulse: 69  SpO2: 93%  Weight: 163 lb 12.8 oz (74.3 kg)  Height: 5\' 7"  (1.702 m)     GEN- The patient is well appearing, alert and oriented x 3 today.   HEENT: normocephalic, atraumatic; sclera clear, conjunctiva pink; hearing intact; oropharynx clear; neck supple  Lungs- Clear to ausculation bilaterally, normal work of breathing.  No wheezes, rales, rhonchi Heart- Regular rate and rhythm, no murmurs, rubs or gallops  GI- soft, non-tender, non-distended, bowel sounds present  Extremities- no clubbing, cyanosis, or edema  MS- no significant deformity or atrophy Skin- warm and dry, no rash or lesion; PPM pocket well healed Psych- euthymic mood, full affect Neuro- strength and sensation are intact  PPM Interrogation- reviewed in detail today,  See PACEART report  EKG:  EKG is ordered today. The ekg ordered today shows NSR at 69 bpm  Recent Labs: 10/10/2018: Magnesium 2.1 10/11/2018: Hemoglobin 13.0; Platelets 124 11/07/2018: TSH 1.030 05/15/2019: ALT 5; BNP 58.7; BUN 13; Creatinine, Ser 1.00; Potassium 4.6; Sodium 138   Wt Readings from Last 3 Encounters:  06/06/19 163 lb 12.8 oz (74.3 kg)  05/15/19 169 lb 12.8 oz (77 kg)  01/29/19 166 lb 6.4 oz (75.5 kg)     Other studies Reviewed: Additional studies/ records that were reviewed today include: Echo 2018 shows LVEF 60-65%, Previous EP office notes, Previous remote checks, Most recent labwork.    Assessment and Plan:  1. SND s/p Medtronic PPM  Normal PPM function See Pace Art report No changes today  2. PAF with variable rate control Rates in 150-170s at times Add lopressor 25 mg BID Continue coumadin for CHA2DS2VASC of at least 4     Current medicines are reviewed at length with the patient today.   The patient does not have concerns regarding his medicines.  The following changes were made today:  Added Lopressor  Labs/ tests ordered today include:  Orders Placed This Encounter  Procedures  . Basic Metabolic Panel (BMET)  . CUP PACEART [place]  . EKG 12-Lead     Disposition:   Follow up with Dr. Lovena Le in 12  Months, sooner with symptoms.    Dustin Flock, PA-C  06/06/2019 2:34 PM  [facility] [address] [place] [address] [phone] (office) [phone] (fax)

## 2019-06-06 NOTE — Patient Instructions (Addendum)
Medication Instructions:  METOPROLOL TARTRATE (LOPRESSOR) 25 mg TWICE DAILY *If you need a refill on your cardiac medications before your next appointment, please call your pharmacy*   Lab Work:  TODAY BMET If you have labs (blood work) drawn today and your tests are completely normal, you will receive your results only by: Marland Kitchen MyChart Message (if you have MyChart) OR . A paper copy in the mail If you have any lab test that is abnormal or we need to change your treatment, we will call you to review the results.   Testing/Procedures: none   Follow-Up: At [facility], you and your health needs are our priority.  As part of our continuing mission to provide you with exceptional heart care, we have created designated Provider Care Teams.  These Care Teams include your primary Cardiologist (physician) and Advanced Practice Providers (APPs -  Physician Assistants and Nurse Practitioners) who all work together to provide you with the care you need, when you need it.  We recommend signing up for the patient portal called "MyChart".  Sign up information is provided on this After Visit Summary.  MyChart is used to connect with patients for Virtual Visits (Telemedicine).  Patients are able to view lab/test results, encounter notes, upcoming appointments, etc.  Non-urgent messages can be sent to your provider as well.   To learn more about what you can do with MyChart, go to [email or web address].    Your next appointment:   1 year(s)  The format for your next appointment:   Either In Person or Virtual  Provider:   Dr [name]   Other Instructions Remote monitoring is used to monitor your Pacemaker  from home. This monitoring reduces the number of office visits required to check your device to one time per year. It allows [place] to keep an eye on the functioning of your device to ensure it is working properly. You are scheduled for a device check from home on 06/19/19. You may send your  transmission at any time that day. If you have a wireless device, the transmission will be sent automatically. After your physician reviews your transmission, you will receive a postcard with your next transmission date.  Metoprolol Tablets What is this medicine? METOPROLOL (me TOE proe lole) is a beta blocker. It decreases the amount of work your heart has to do and helps your heart beat regularly. It is used to treat high blood pressure and/or prevent chest pain (also called angina). It is also used after a heart attack to prevent a second one. This medicine may be used for other purposes; ask your health care provider or pharmacist if you have questions. COMMON BRAND NAME(S): Lopressor What should I tell my health care provider before I take this medicine? They need to know if you have any of these conditions:  diabetes  heart or vessel disease like slow heart rate, worsening heart failure, heart block, sick sinus syndrome or Raynaud's disease  kidney disease  liver disease  lung or breathing disease, like asthma or emphysema  pheochromocytoma  thyroid disease  an unusual or allergic reaction to metoprolol, other beta-blockers, medicines, foods, dyes, or preservatives  pregnant or trying to get pregnant  breast-feeding How should I use this medicine? Take this drug by mouth with water. Take it as directed on the prescription label at the same time every day. You can take it with or without food. You should always take it the same way. Keep taking it unless your health care provider  tells you to stop. Talk to your health care provider about the use of this drug in children. Special care may be needed. Overdosage: If you think you have taken too much of this medicine contact a poison control center or emergency room at once. NOTE: This medicine is only for you. Do not share this medicine with others. What if I miss a dose? If you miss a dose, take it as soon as you can. If it is  almost time for your next dose, take only that dose. Do not take double or extra doses. What may interact with this medicine? This medicine may interact with the following medications:  certain medicines for blood pressure, heart disease, irregular heart beat  certain medicines for depression like monoamine oxidase (MAO) inhibitors, fluoxetine, or paroxetine  clonidine  dobutamine  epinephrine  isoproterenol  reserpine This list may not describe all possible interactions. Give your health care provider a list of all the medicines, herbs, non-prescription drugs, or dietary supplements you use. Also tell them if you smoke, drink alcohol, or use illegal drugs. Some items may interact with your medicine. What should I watch for while using this medicine? Visit your doctor or health care professional for regular check ups. Contact your doctor right away if your symptoms worsen. Check your blood pressure and pulse rate regularly. Ask your health care professional what your blood pressure and pulse rate should be, and when you should contact them. You may get drowsy or dizzy. Do not drive, use machinery, or do anything that needs mental alertness until you know how this medicine affects you. Do not sit or stand up quickly, especially if you are an older patient. This reduces the risk of dizzy or fainting spells. Contact your doctor if these symptoms continue. Alcohol may interfere with the effect of this medicine. Avoid alcoholic drinks. This medicine may increase blood sugar. Ask your healthcare provider if changes in diet or medicines are needed if you have diabetes. What side effects may I notice from receiving this medicine? Side effects that you should report to your doctor or health care professional as soon as possible:  allergic reactions like skin rash, itching or hives  cold or numb hands or feet  depression  difficulty breathing  faint  fever with sore throat  irregular  heartbeat, chest pain  rapid weight gain   signs and symptoms of high blood sugar such as being more thirsty or hungry or having to urinate more than normal. You may also feel very tired or have blurry vision.  swollen legs or ankles Side effects that usually do not require medical attention (report to your doctor or health care professional if they continue or are bothersome):  anxiety or nervousness  change in sex drive or performance  dry skin  headache  nightmares or trouble sleeping  short term memory loss  stomach upset or diarrhea This list may not describe all possible side effects. Call your doctor for medical advice about side effects. You may report side effects to FDA at 1-800-FDA-1088. Where should I keep my medicine? Keep out of the reach of children and pets. Store at room temperature between 15 and 30 degrees C (59 and 86 degrees F). Protect from moisture. Keep the container tightly closed. Throw away any unused drug after the expiration date. NOTE: This sheet is a summary. It may not cover all possible information. If you have questions about this medicine, talk to your doctor, pharmacist, or health care provider.  2020 Elsevier/Gold Standard (2018-10-04 17:21:17)

## 2019-06-07 LAB — BASIC METABOLIC PANEL
BUN/Creatinine Ratio: 13 (ref 10–24)
BUN: 13 mg/dL (ref 8–27)
CO2: 26 mmol/L (ref 20–29)
Calcium: 9.2 mg/dL (ref 8.6–10.2)
Chloride: 102 mmol/L (ref 96–106)
Creatinine, Ser: 0.98 mg/dL (ref 0.76–1.27)
GFR calc Af Amer: 91 mL/min/{1.73_m2} (ref >59)
GFR calc non Af Amer: 79 mL/min/{1.73_m2} (ref >59)
Glucose: 75 mg/dL (ref 65–99)
Potassium: 4 mmol/L (ref 3.5–5.2)
Sodium: 139 mmol/L (ref 134–144)

## 2019-06-14 DIAGNOSIS — M545 Low back pain: Secondary | ICD-10-CM | POA: Diagnosis not present

## 2019-06-19 ENCOUNTER — Ambulatory Visit (INDEPENDENT_AMBULATORY_CARE_PROVIDER_SITE_OTHER): Payer: Medicare Other | Admitting: *Deleted

## 2019-06-19 DIAGNOSIS — I480 Paroxysmal atrial fibrillation: Secondary | ICD-10-CM

## 2019-06-19 LAB — CUP PACEART REMOTE DEVICE CHECK
Battery Remaining Longevity: 149 mo
Battery Voltage: 3.02 V
Brady Statistic AP VP Percent: 0.02 %
Brady Statistic AP VS Percent: 17.95 %
Brady Statistic AS VP Percent: 0.03 %
Brady Statistic AS VS Percent: 82 %
Brady Statistic RA Percent Paced: 18.01 %
Brady Statistic RV Percent Paced: 0.05 %
Date Time Interrogation Session: 20210414010134
Implantable Lead Implant Date: 20180410
Implantable Lead Implant Date: 20180410
Implantable Lead Location: 753859
Implantable Lead Location: 753860
Implantable Lead Model: 3830
Implantable Lead Model: 5076
Implantable Pulse Generator Implant Date: 20180410
Lead Channel Impedance Value: 285 Ohm
Lead Channel Impedance Value: 285 Ohm
Lead Channel Impedance Value: 418 Ohm
Lead Channel Impedance Value: 475 Ohm
Lead Channel Pacing Threshold Amplitude: 0.5 V
Lead Channel Pacing Threshold Amplitude: 1.5 V
Lead Channel Pacing Threshold Pulse Width: 0.4 ms
Lead Channel Pacing Threshold Pulse Width: 0.4 ms
Lead Channel Sensing Intrinsic Amplitude: 3.375 mV
Lead Channel Sensing Intrinsic Amplitude: 3.375 mV
Lead Channel Sensing Intrinsic Amplitude: 6.75 mV
Lead Channel Sensing Intrinsic Amplitude: 6.75 mV
Lead Channel Setting Pacing Amplitude: 1.5 V
Lead Channel Setting Pacing Amplitude: 2 V
Lead Channel Setting Pacing Pulse Width: 1 ms
Lead Channel Setting Sensing Sensitivity: 2 mV

## 2019-06-19 NOTE — Progress Notes (Signed)
PPM Remote  

## 2019-06-28 ENCOUNTER — Ambulatory Visit (INDEPENDENT_AMBULATORY_CARE_PROVIDER_SITE_OTHER): Payer: Medicare Other | Admitting: *Deleted

## 2019-06-28 ENCOUNTER — Other Ambulatory Visit: Payer: Self-pay

## 2019-06-28 DIAGNOSIS — Z5181 Encounter for therapeutic drug level monitoring: Secondary | ICD-10-CM

## 2019-06-28 DIAGNOSIS — I480 Paroxysmal atrial fibrillation: Secondary | ICD-10-CM | POA: Diagnosis not present

## 2019-06-28 DIAGNOSIS — M545 Low back pain: Secondary | ICD-10-CM | POA: Diagnosis not present

## 2019-06-28 LAB — POCT INR: INR: 4.6 — AB (ref 2.0–3.0)

## 2019-06-28 NOTE — Patient Instructions (Signed)
Description   Hold 4/24 and 4/25 then continue with 1/2 tablet daily except 1 tablet on Mondays, Wednesdays, and Fridays. Be consistent with your green vegetable intake. Recheck INR 2 weeks. Call Coumadin clinic with new medications #[phone]

## 2019-07-08 ENCOUNTER — Other Ambulatory Visit: Payer: Self-pay | Admitting: Family Medicine

## 2019-07-08 ENCOUNTER — Other Ambulatory Visit: Payer: Self-pay | Admitting: Internal Medicine

## 2019-07-08 DIAGNOSIS — I480 Paroxysmal atrial fibrillation: Secondary | ICD-10-CM

## 2019-07-12 ENCOUNTER — Other Ambulatory Visit: Payer: Self-pay

## 2019-07-12 ENCOUNTER — Ambulatory Visit (INDEPENDENT_AMBULATORY_CARE_PROVIDER_SITE_OTHER): Payer: Medicare Other | Admitting: Pharmacist

## 2019-07-12 DIAGNOSIS — Z5181 Encounter for therapeutic drug level monitoring: Secondary | ICD-10-CM | POA: Diagnosis not present

## 2019-07-12 DIAGNOSIS — I480 Paroxysmal atrial fibrillation: Secondary | ICD-10-CM | POA: Diagnosis not present

## 2019-07-12 LAB — POCT INR: INR: 3.1 — AB (ref 2.0–3.0)

## 2019-07-12 NOTE — Patient Instructions (Signed)
Skip dose tomorrow then continue with 1/2 tablet daily except 1 tablet on Mondays, Wednesdays, and Fridays. Be consistent with your green vegetable intake. Recheck INR 2 weeks. Call Coumadin clinic with new medications #[phone]

## 2019-07-24 ENCOUNTER — Telehealth: Payer: Self-pay | Admitting: Adult Health

## 2019-07-24 NOTE — Telephone Encounter (Signed)
Chell @ [employer] called asking if the restriction form that was faxed here on 05-17 has been completed.  Chell is asking for a call back at [phone]

## 2019-07-25 NOTE — Telephone Encounter (Signed)
Called [employer], spoke with Chell and advised her that the NP stated from a neurological standpoint there is no disability. She verbalized understanding, appreciation, stated she will update their records.Marland Kitchen

## 2019-07-29 ENCOUNTER — Telehealth: Payer: Self-pay | Admitting: Adult Health

## 2019-07-29 ENCOUNTER — Ambulatory Visit: Payer: Medicare Other | Admitting: Adult Health

## 2019-07-29 NOTE — Telephone Encounter (Signed)
Pt called to verify if any updated paperwork has been received/completed from [facility] allowing pt to be able to drive

## 2019-08-01 NOTE — Telephone Encounter (Signed)
I spoke to [place] and no there paperwork had been received

## 2019-08-01 NOTE — Telephone Encounter (Signed)
I spoke to the patient. She is actually waiting on the DMV. Pt said he did not know if we received anything additional. He signed a form and mailed it back to the [facility] but he has not heard anything back from them.

## 2019-08-12 ENCOUNTER — Telehealth: Payer: Self-pay | Admitting: Internal Medicine

## 2019-08-12 NOTE — Telephone Encounter (Signed)
Patient called stating he received a letter to schedule an appointment in may. I informed him he was last seen in April of this year and is not due until April 2022 and to disregard the letter.

## 2019-08-15 ENCOUNTER — Ambulatory Visit (INDEPENDENT_AMBULATORY_CARE_PROVIDER_SITE_OTHER): Payer: Medicare Other | Admitting: Pharmacist

## 2019-08-15 ENCOUNTER — Other Ambulatory Visit: Payer: Self-pay

## 2019-08-15 DIAGNOSIS — Z5181 Encounter for therapeutic drug level monitoring: Secondary | ICD-10-CM

## 2019-08-15 DIAGNOSIS — I480 Paroxysmal atrial fibrillation: Secondary | ICD-10-CM | POA: Diagnosis not present

## 2019-08-15 LAB — POCT INR: INR: 2.8 (ref 2.0–3.0)

## 2019-08-15 NOTE — Patient Instructions (Signed)
Description   Continue with 1/2 tablet daily except 1 tablet on Mondays, Wednesdays, and Fridays. Be consistent with your green vegetable intake. Recheck INR 4 weeks. Call Coumadin clinic with new medications #[phone]

## 2019-08-19 NOTE — Telephone Encounter (Signed)
Pt is needing to discuss his DMV forms with RN. Please advise.

## 2019-08-20 NOTE — Telephone Encounter (Signed)
I spoke to Patrick Kelly, sister of pt and made appt with them 08-21-19 at 1445.  Needs appt for DMV form to be filled out so he can drive.  Issue with DMV (release form). Copy of Form to be given to them when in.  I will fill out ahead of time for them.

## 2019-08-20 NOTE — Telephone Encounter (Signed)
LM again for the patient to call back re" DMV forms.

## 2019-08-20 NOTE — Telephone Encounter (Signed)
I LVM for pt that have not received DMV at my desk, will ask MR.  Please call back re: how we can help.

## 2019-08-21 ENCOUNTER — Encounter: Payer: Self-pay | Admitting: Adult Health

## 2019-08-21 ENCOUNTER — Other Ambulatory Visit: Payer: Self-pay

## 2019-08-21 ENCOUNTER — Ambulatory Visit (INDEPENDENT_AMBULATORY_CARE_PROVIDER_SITE_OTHER): Payer: Medicare Other | Admitting: Adult Health

## 2019-08-21 VITALS — BP 107/74 | HR 71 | Ht 67.0 in | Wt 166.0 lb

## 2019-08-21 DIAGNOSIS — R569 Unspecified convulsions: Secondary | ICD-10-CM | POA: Diagnosis not present

## 2019-08-21 MED ORDER — LEVETIRACETAM 500 MG PO TABS: 500.0000 mg | ORAL_TABLET | Freq: Two times a day (BID) | ORAL | 3 refills | Status: DC

## 2019-08-21 NOTE — Patient Instructions (Signed)
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 I hope we have been able to provide you high quality care today.  You may receive a patient satisfaction survey over the next few weeks. We would appreciate your feedback and comments so that we may continue to improve ourselves and the health of our patients.

## 2019-08-21 NOTE — Telephone Encounter (Signed)
Pt in for visit.  FAX confirmation received with DMV completed and signed [phone].  Copy to MR.
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
## 2019-08-28 ENCOUNTER — Ambulatory Visit: Payer: Medicare Other | Attending: Family Medicine | Admitting: Family Medicine

## 2019-08-28 ENCOUNTER — Encounter: Payer: Self-pay | Admitting: Family Medicine

## 2019-08-28 ENCOUNTER — Other Ambulatory Visit: Payer: Self-pay

## 2019-08-28 VITALS — BP 128/79 | HR 73 | Ht 67.0 in | Wt 159.0 lb

## 2019-08-28 DIAGNOSIS — I480 Paroxysmal atrial fibrillation: Secondary | ICD-10-CM | POA: Diagnosis not present

## 2019-08-28 DIAGNOSIS — Z950 Presence of cardiac pacemaker: Secondary | ICD-10-CM | POA: Diagnosis not present

## 2019-08-28 DIAGNOSIS — E559 Vitamin D deficiency, unspecified: Secondary | ICD-10-CM | POA: Diagnosis not present

## 2019-08-28 DIAGNOSIS — R569 Unspecified convulsions: Secondary | ICD-10-CM

## 2019-08-28 DIAGNOSIS — Z79899 Other long term (current) drug therapy: Secondary | ICD-10-CM | POA: Diagnosis not present

## 2019-08-28 DIAGNOSIS — Z7901 Long term (current) use of anticoagulants: Secondary | ICD-10-CM | POA: Insufficient documentation

## 2019-08-28 DIAGNOSIS — M7502 Adhesive capsulitis of left shoulder: Secondary | ICD-10-CM | POA: Diagnosis not present

## 2019-08-28 DIAGNOSIS — J439 Emphysema, unspecified: Secondary | ICD-10-CM | POA: Diagnosis not present

## 2019-08-28 DIAGNOSIS — I10 Essential (primary) hypertension: Secondary | ICD-10-CM | POA: Diagnosis present

## 2019-08-28 DIAGNOSIS — G40909 Epilepsy, unspecified, not intractable, without status epilepticus: Secondary | ICD-10-CM | POA: Insufficient documentation

## 2019-08-28 DIAGNOSIS — Z8249 Family history of ischemic heart disease and other diseases of the circulatory system: Secondary | ICD-10-CM | POA: Insufficient documentation

## 2019-08-28 DIAGNOSIS — Z1211 Encounter for screening for malignant neoplasm of colon: Secondary | ICD-10-CM | POA: Insufficient documentation
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
## 2019-08-28 NOTE — Patient Instructions (Signed)
Adhesive Capsulitis  Adhesive capsulitis, also called frozen shoulder, causes the shoulder to become stiff and painful to move. This condition happens when there is inflammation of the tendons and ligaments that surround the shoulder joint (shoulder capsule). What are the causes? This condition may be caused by:  An injury to your shoulder joint.  Straining your shoulder.  Not moving your shoulder for a period of time. This can happen if your arm was injured or in a sling.  Long-standing conditions, such as: ? Diabetes. ? Thyroid problems. ? Heart disease. ? Stroke. ? Rheumatoid arthritis. ? Lung disease. In some cases, the cause is not known. What increases the risk? You are more likely to develop this condition if you are:  A woman.  Older than 69 years of age. What are the signs or symptoms? Symptoms of this condition include:  Pain in your shoulder when you move your arm. There may also be pain when parts of your shoulder are touched. The pain may be worse at night or when you are resting.  A sore or aching shoulder.  The inability to move your shoulder normally.  Muscle spasms. How is this diagnosed? This condition is diagnosed with a physical exam and imaging tests, such as an X-ray or MRI. How is this treated? This condition may be treated with:  Treatment of the underlying cause or condition.  Medicine. Medicine may be given to relieve pain, inflammation, or muscle spasms.  Steroid injections into the shoulder joint.  Physical therapy. This involves performing exercises to get the shoulder moving again.  Acupuncture. This is a type of treatment that involves stimulating specific points on your body by inserting thin needles through your skin.  Shoulder manipulation. This is a procedure to move the shoulder into another position. It is done after you are given a medicine to make you fall asleep (general anesthetic). The joint may also be injected with salt  water at high pressure to break down scarring.  Surgery. This may be done in severe cases when other treatments have failed. Although most people recover completely from adhesive capsulitis, some may not regain full shoulder movement. Follow these instructions at home: Managing pain, stiffness, and swelling      If directed, put ice on the injured area: ? Put ice in a plastic bag. ? Place a towel between your skin and the bag. ? Leave the ice on for 20 minutes, 2-3 times per day.  If directed, apply heat to the affected area before you exercise. Use the heat source that your health care provider recommends, such as a moist heat pack or a heating pad. ? Place a towel between your skin and the heat source. ? Leave the heat on for 20-30 minutes. ? Remove the heat if your skin turns bright red. This is especially important if you are unable to feel pain, heat, or cold. You may have a greater risk of getting burned. General instructions  Take over-the-counter and prescription medicines only as told by your health care provider.  If you are being treated with physical therapy, follow instructions from your physical therapist.  Avoid exercises that put a lot of demand on your shoulder, such as throwing. These exercises can make pain worse.  Keep all follow-up visits as told by your health care provider. This is important. Contact a health care provider if:  You develop new symptoms.  Your symptoms get worse. Summary  Adhesive capsulitis, also called frozen shoulder, causes the shoulder to become   stiff and painful to move.  You are more likely to have this condition if you are a woman and over age 40.  It is treated with physical therapy, medicines, and sometimes surgery. This information is not intended to replace advice given to you by your health care provider. Make sure you discuss any questions you have with your health care provider. Document Revised: 07/28/2017 Document  Reviewed: 07/28/2017 Elsevier Patient Education  2020 Elsevier Inc.  

## 2019-08-29 LAB — VITAMIN D 25 HYDROXY (VIT D DEFICIENCY, FRACTURES): Vit D, 25-Hydroxy: 72.2 ng/mL (ref 30.0–100.0)

## 2019-09-02 ENCOUNTER — Telehealth: Payer: Self-pay

## 2019-09-02 NOTE — Telephone Encounter (Signed)
Patient was called and a voicemail was left informing patient to return phone call for lab results. 

## 2019-09-02 NOTE — Telephone Encounter (Signed)
-----   Message from Hoy Register, MD sent at 08/29/2019 11:16 AM EDT ----- Please inform the patient that labs are normal. Thank you.

## 2019-09-05 ENCOUNTER — Telehealth: Payer: Self-pay

## 2019-09-05 NOTE — Telephone Encounter (Signed)
Patient was called and informed of lab results. 

## 2019-09-05 NOTE — Telephone Encounter (Signed)
-----   Message from Enobong Newlin, MD sent at 08/29/2019 11:16 AM EDT ----- °Please inform the patient that labs are normal. Thank you. °

## 2019-09-12 ENCOUNTER — Ambulatory Visit (INDEPENDENT_AMBULATORY_CARE_PROVIDER_SITE_OTHER): Payer: Medicare Other | Admitting: *Deleted

## 2019-09-12 ENCOUNTER — Other Ambulatory Visit: Payer: Self-pay

## 2019-09-12 DIAGNOSIS — Z5181 Encounter for therapeutic drug level monitoring: Secondary | ICD-10-CM

## 2019-09-12 DIAGNOSIS — I480 Paroxysmal atrial fibrillation: Secondary | ICD-10-CM | POA: Diagnosis not present

## 2019-09-12 LAB — POCT INR: INR: 3.6 — AB (ref 2.0–3.0)

## 2019-09-12 NOTE — Patient Instructions (Signed)
Description   Do not take any Warfarin tomorrow then continue with 1/2 tablet daily except 1 tablet on Mondays, Wednesdays, and Fridays. Be consistent with your green vegetable intake. Recheck INR 3 weeks. Call Coumadin clinic with new medications #[phone]

## 2019-09-18 ENCOUNTER — Ambulatory Visit (INDEPENDENT_AMBULATORY_CARE_PROVIDER_SITE_OTHER): Payer: Medicare Other | Admitting: *Deleted

## 2019-09-18 DIAGNOSIS — I480 Paroxysmal atrial fibrillation: Secondary | ICD-10-CM | POA: Diagnosis not present

## 2019-09-18 LAB — CUP PACEART REMOTE DEVICE CHECK
Battery Remaining Longevity: 146 mo
Battery Voltage: 3.02 V
Brady Statistic AP VP Percent: 0.02 %
Brady Statistic AP VS Percent: 26.7 %
Brady Statistic AS VP Percent: 0.04 %
Brady Statistic AS VS Percent: 73.25 %
Brady Statistic RA Percent Paced: 26.68 %
Brady Statistic RV Percent Paced: 0.09 %
Date Time Interrogation Session: 20210714010331
Implantable Lead Implant Date: 20180410
Implantable Lead Implant Date: 20180410
Implantable Lead Location: 753859
Implantable Lead Location: 753860
Implantable Lead Model: 3830
Implantable Lead Model: 5076
Implantable Pulse Generator Implant Date: 20180410
Lead Channel Impedance Value: 285 Ohm
Lead Channel Impedance Value: 304 Ohm
Lead Channel Impedance Value: 418 Ohm
Lead Channel Impedance Value: 456 Ohm
Lead Channel Pacing Threshold Amplitude: 0.5 V
Lead Channel Pacing Threshold Amplitude: 1.875 V
Lead Channel Pacing Threshold Pulse Width: 0.4 ms
Lead Channel Pacing Threshold Pulse Width: 0.4 ms
Lead Channel Sensing Intrinsic Amplitude: 3 mV
Lead Channel Sensing Intrinsic Amplitude: 3 mV
Lead Channel Sensing Intrinsic Amplitude: 7.875 mV
Lead Channel Sensing Intrinsic Amplitude: 7.875 mV
Lead Channel Setting Pacing Amplitude: 1.5 V
Lead Channel Setting Pacing Amplitude: 2 V
Lead Channel Setting Pacing Pulse Width: 1 ms
Lead Channel Setting Sensing Sensitivity: 2 mV

## 2019-09-19 ENCOUNTER — Ambulatory Visit: Payer: Medicare Other | Admitting: Adult Health

## 2019-09-19 NOTE — Progress Notes (Signed)
Remote pacemaker transmission.   

## 2019-09-20 DIAGNOSIS — M7542 Impingement syndrome of left shoulder: Secondary | ICD-10-CM | POA: Diagnosis not present

## 2019-09-20 DIAGNOSIS — M25512 Pain in left shoulder: Secondary | ICD-10-CM | POA: Diagnosis not present

## 2019-09-26 ENCOUNTER — Ambulatory Visit: Payer: Medicare Other | Admitting: Adult Health

## 2019-10-03 ENCOUNTER — Other Ambulatory Visit: Payer: Self-pay

## 2019-10-03 ENCOUNTER — Ambulatory Visit (INDEPENDENT_AMBULATORY_CARE_PROVIDER_SITE_OTHER): Payer: Medicare Other

## 2019-10-03 DIAGNOSIS — I480 Paroxysmal atrial fibrillation: Secondary | ICD-10-CM

## 2019-10-03 DIAGNOSIS — Z5181 Encounter for therapeutic drug level monitoring: Secondary | ICD-10-CM

## 2019-10-03 LAB — POCT INR: INR: 3 (ref 2.0–3.0)

## 2019-10-03 NOTE — Patient Instructions (Addendum)
Description   Continue on same dosage 1/2 tablet daily except 1 tablet on Mondays, Wednesdays, and Fridays. Be consistent with your green vegetable intake. Recheck INR 4 weeks. Call Coumadin clinic with new medications #[phone]

## 2019-10-06 ENCOUNTER — Other Ambulatory Visit: Payer: Self-pay | Admitting: Internal Medicine

## 2019-10-06 DIAGNOSIS — I480 Paroxysmal atrial fibrillation: Secondary | ICD-10-CM

## 2019-10-10 DIAGNOSIS — M25512 Pain in left shoulder: Secondary | ICD-10-CM | POA: Diagnosis not present

## 2019-10-15 ENCOUNTER — Other Ambulatory Visit: Payer: Self-pay | Admitting: Family Medicine

## 2019-10-15 DIAGNOSIS — M25512 Pain in left shoulder: Secondary | ICD-10-CM | POA: Diagnosis not present

## 2019-10-15 NOTE — Telephone Encounter (Signed)
Requested medication (s) are due for refill today: yes  Requested medication (s) are on the active medication list: yes  Last refill:  07/10/19 #9 0 refills  Future visit scheduled: no  Notes to clinic:  not delegated per protocol     Requested Prescriptions  Pending Prescriptions Disp Refills   Vitamin D, Ergocalciferol, (DRISDOL) 1.25 MG (50000 UNIT) CAPS capsule [Pharmacy Med Name: VIT D2 (ERGOCAL) 1.25MG (50,000U) CP] 9 capsule 0    Sig: TAKE ONE CAPSULE BY MOUTH ONCE WEEKLY      Endocrinology:  Vitamins - Vitamin D Supplementation Failed - 10/15/2019  5:46 PM      Failed - 50,000 IU strengths are not delegated      Failed - Phosphate in normal range and within 360 days    No results found for: PHOS        Passed - Ca in normal range and within 360 days    Calcium  Date Value Ref Range Status  06/06/2019 9.2 8.6 - 10.2 mg/dL Final   Calcium, Ion  Date Value Ref Range Status  03/04/2018 1.20 1.15 - 1.40 mmol/L Final          Passed - Vitamin D in normal range and within 360 days    Vit D, 25-Hydroxy  Date Value Ref Range Status  08/28/2019 72.2 30.0 - 100.0 ng/mL Final    Comment:    Vitamin D deficiency has been defined by the Institute of Medicine and an Endocrine Society practice guideline as a level of serum 25-OH vitamin D less than 20 ng/mL (1,2). The Endocrine Society went on to further define vitamin D insufficiency as a level between 21 and 29 ng/mL (2). 1. IOM (Institute of Medicine). 2010. Dietary reference    intakes for calcium and D. [place]: The    [employer]. 2. Holick MF, Binkley [place], Bischoff-Ferrari HA, et al.    Evaluation, treatment, and prevention of vitamin D    deficiency: an Endocrine Society clinical practice    guideline. JCEM. 2011 Jul; 96(7):1911-30.           Passed - Valid encounter within last 12 months    Recent Outpatient Visits           1 month ago Colon cancer screening   [facility] [place], Odette Horns, MD   5 months ago PAF (paroxysmal atrial fibrillation) [facility])   [facility] [place], [place], [place]   9 months ago Compression fracture of L2 vertebra with routine healing, subsequent encounter   [facility] Hoy Register, MD   9 months ago Compression fracture of L2 vertebra with routine healing, subsequent encounter   [facility] Hoy Register, MD   11 months ago Pulmonary nodule   [facility] Storm Frisk, MD

## 2019-10-22 DIAGNOSIS — M25512 Pain in left shoulder: Secondary | ICD-10-CM | POA: Diagnosis not present

## 2019-10-29 DIAGNOSIS — M25512 Pain in left shoulder: Secondary | ICD-10-CM | POA: Diagnosis not present

## 2019-10-31 ENCOUNTER — Other Ambulatory Visit: Payer: Self-pay

## 2019-10-31 ENCOUNTER — Ambulatory Visit (INDEPENDENT_AMBULATORY_CARE_PROVIDER_SITE_OTHER): Payer: Medicare Other | Admitting: Pharmacist

## 2019-10-31 DIAGNOSIS — I480 Paroxysmal atrial fibrillation: Secondary | ICD-10-CM

## 2019-10-31 DIAGNOSIS — Z5181 Encounter for therapeutic drug level monitoring: Secondary | ICD-10-CM | POA: Diagnosis not present

## 2019-10-31 LAB — POCT INR: INR: 4.5 — AB (ref 2.0–3.0)

## 2019-10-31 NOTE — Patient Instructions (Addendum)
Description   Skip your Coumadin for 2 days, then start taking 1/2 tablet daily except 1 tablet on Mondays and Fridays. Be consistent with your green vegetable intake. Recheck INR 2 weeks. Call Coumadin clinic with new medications #[phone]

## 2019-11-05 DIAGNOSIS — M25512 Pain in left shoulder: Secondary | ICD-10-CM | POA: Diagnosis not present

## 2019-11-07 DIAGNOSIS — M7542 Impingement syndrome of left shoulder: Secondary | ICD-10-CM | POA: Diagnosis not present

## 2019-11-07 DIAGNOSIS — M25512 Pain in left shoulder: Secondary | ICD-10-CM | POA: Diagnosis not present

## 2019-11-14 ENCOUNTER — Ambulatory Visit: Payer: Medicare Other | Attending: Physician Assistant | Admitting: Physician Assistant

## 2019-11-14 ENCOUNTER — Encounter: Payer: Self-pay | Admitting: Physician Assistant

## 2019-11-14 ENCOUNTER — Other Ambulatory Visit: Payer: Self-pay

## 2019-11-14 VITALS — BP 118/71 | HR 59 | Temp 97.7°F | Ht 67.0 in | Wt 165.0 lb

## 2019-11-14 DIAGNOSIS — E559 Vitamin D deficiency, unspecified: Secondary | ICD-10-CM | POA: Diagnosis not present

## 2019-11-14 DIAGNOSIS — Z950 Presence of cardiac pacemaker: Secondary | ICD-10-CM | POA: Insufficient documentation

## 2019-11-14 DIAGNOSIS — I10 Essential (primary) hypertension: Secondary | ICD-10-CM | POA: Diagnosis not present

## 2019-11-14 DIAGNOSIS — J439 Emphysema, unspecified: Secondary | ICD-10-CM | POA: Insufficient documentation

## 2019-11-14 DIAGNOSIS — Z79899 Other long term (current) drug therapy: Secondary | ICD-10-CM | POA: Diagnosis not present

## 2019-11-14 DIAGNOSIS — Z7901 Long term (current) use of anticoagulants: Secondary | ICD-10-CM | POA: Insufficient documentation

## 2019-11-14 DIAGNOSIS — L84 Corns and callosities: Secondary | ICD-10-CM | POA: Diagnosis not present

## 2019-11-14 DIAGNOSIS — I480 Paroxysmal atrial fibrillation: Secondary | ICD-10-CM | POA: Insufficient documentation

## 2019-11-14 NOTE — Progress Notes (Signed)
Patrick Kelly, is a 68 y.o. male  JHE:[id number]  JEH:[id number]  DOB - Oct 20, 1950  Subjective:  Chief Complaint and HPI: Patrick Kelly is a 69 y.o. male here today for vitamin D check and corns on his feet that are painful at times.  He has had these intermittently for years.  ROS:   Constitutional:  No f/c, No night sweats, No unexplained weight loss. EENT:  No vision changes, No blurry vision, No hearing changes. No mouth, throat, or ear problems.  Respiratory: No cough, No SOB Cardiac: No CP, no palpitations GI:  No abd pain, No N/V/D. GU: No Urinary s/sx Musculoskeletal: No joint pain Neuro: No headache, no dizziness, no motor weakness.  Skin: No rash Endocrine:  No polydipsia. No polyuria.  Psych: Denies SI/HI  No problems updated.  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 Authorizing Provider  levETIRAcetam (KEPPRA) 500 MG tablet Take 1 tablet (500 mg total) by mouth 2 (two) times daily. 08/21/19  Yes McCue, Shanda Bumps, NP  vitamin B-12 (CYANOCOBALAMIN) 1000 MCG tablet Take 1 tablet (1,000 mcg total) by mouth daily. 10/11/18  Yes Angelita Ingles, MD  Vitamin D, Ergocalciferol, (DRISDOL) 1.25 MG (50000 UNIT) CAPS capsule TAKE ONE CAPSULE BY MOUTH ONCE WEEKLY 07/10/19  Yes Hoy Register, MD  warfarin (COUMADIN) 5 MG tablet TAKE 1/2 TABLET BY MOUTH DAILY EXCEPT TAKE ONE TABLET ON MONDAY, WEDNESDAY, FRIDAY, OR AS DIRECTED BY ANTICOAGULATION CLINIC 10/07/19  Yes [name], MD  metoprolol tartrate (LOPRESSOR) 25 MG tablet Take 1 tablet (25 mg total) by mouth 2 (two) times daily. 06/06/19 09/04/19  Graciella Freer, PA-C     Objective:  EXAM:   Vitals:   11/14/19 1522  BP: 118/71  Pulse: (!) 59  Temp: 97.7 F (36.5 C)  TempSrc: Temporal  SpO2: 98%  Weight: 165 lb (74.8 kg)  Height: 5\' 7"  (1.702 m)    General appearance : A&OX3. NAD. Non-toxic-appearing HEENT: Atraumatic and Normocephalic.  PERRLA. EOM intact.  Chest/Lungs:  Breathing-non-labored, Good air entry bilaterally, breath sounds normal without rales, rhonchi, or wheezing  CVS: S1 S2 regular, no murmurs, gallops, rubs  Extremities: Bilateral Lower Ext shows no edema, both legs are warm to touch with = pulse throughout Neurology:  CN II-XII grossly intact, Non focal.   Psych:  TP linear. J/I WNL. Normal speech. Appropriate eye contact and affect.  Skin:  No Rash  Data Review Lab Results  Component Value Date   HGBA1C 4.6 (L) 03/04/2018   HGBA1C 4.9 12/27/2017   HGBA1C 5.0 06/11/2016     Assessment & Plan   1. Corns - Ambulatory referral to Podiatry  2. Vitamin D deficiency Last level was great 2 months ago!! - Vitamin D, 25-hydroxy  Patient have been counseled extensively about nutrition and exercise  Return in about 4 months (around 03/15/2020) for see pcp;  chronic conditions.  The patient was given clear instructions to go to ER or return to medical center if symptoms don't improve, worsen or new problems develop. The patient verbalized understanding. The patient was told to call to get lab results if they haven't heard anything in the next week.     Georgian Co, PA-C [facility] [place], [place] [phone]   11/14/2019, 3:45  PMPatient ID: Patrick Kelly, male   DOB: [birth year], 69 y.o.   MRN: [id number]

## 2019-11-15 ENCOUNTER — Ambulatory Visit (INDEPENDENT_AMBULATORY_CARE_PROVIDER_SITE_OTHER): Payer: Medicare Other | Admitting: *Deleted

## 2019-11-15 DIAGNOSIS — Z5181 Encounter for therapeutic drug level monitoring: Secondary | ICD-10-CM

## 2019-11-15 DIAGNOSIS — I480 Paroxysmal atrial fibrillation: Secondary | ICD-10-CM

## 2019-11-15 LAB — POCT INR: INR: 3.4 — AB (ref 2.0–3.0)

## 2019-11-15 LAB — VITAMIN D 25 HYDROXY (VIT D DEFICIENCY, FRACTURES): Vit D, 25-Hydroxy: 37.4 ng/mL (ref 30.0–100.0)

## 2019-11-15 NOTE — Patient Instructions (Signed)
Description   Skip your Coumadin tomorrow,  then start taking 1/2 tablet daily except 1 tablet on Mondays and Fridays. Be consistent with your green vegetable intake. Recheck INR 2 weeks. Call Coumadin clinic with new medications #[phone]

## 2019-11-29 ENCOUNTER — Ambulatory Visit (INDEPENDENT_AMBULATORY_CARE_PROVIDER_SITE_OTHER): Payer: Medicare Other | Admitting: Pharmacist

## 2019-11-29 ENCOUNTER — Other Ambulatory Visit: Payer: Self-pay

## 2019-11-29 DIAGNOSIS — Z5181 Encounter for therapeutic drug level monitoring: Secondary | ICD-10-CM | POA: Diagnosis not present

## 2019-11-29 DIAGNOSIS — I480 Paroxysmal atrial fibrillation: Secondary | ICD-10-CM

## 2019-11-29 LAB — POCT INR: INR: 2.8 (ref 2.0–3.0)

## 2019-11-29 NOTE — Patient Instructions (Signed)
Description   Continue taking 1/2 tablet daily except 1 tablet on Mondays and Fridays. Be consistent with your green vegetable intake. Recheck INR in 3-4 weeks. Call Coumadin clinic with new medications #[phone]

## 2019-12-03 ENCOUNTER — Other Ambulatory Visit: Payer: Self-pay

## 2019-12-03 ENCOUNTER — Ambulatory Visit (INDEPENDENT_AMBULATORY_CARE_PROVIDER_SITE_OTHER): Payer: Medicare Other | Admitting: Podiatry

## 2019-12-03 DIAGNOSIS — Q828 Other specified congenital malformations of skin: Secondary | ICD-10-CM

## 2019-12-03 DIAGNOSIS — B351 Tinea unguium: Secondary | ICD-10-CM | POA: Diagnosis not present

## 2019-12-03 DIAGNOSIS — D689 Coagulation defect, unspecified: Secondary | ICD-10-CM | POA: Diagnosis not present

## 2019-12-03 NOTE — Progress Notes (Signed)
°  Subjective:  Patient ID: Patrick Kelly, male    DOB: 14-Mar-1950,  MRN: [id number]  Chief Complaint  Patient presents with   Nail Problem    Nail trim 1-5 bilateral   Foot Problem    Bilateral plantar lesions, 2 right 1 left.   Foot Problem    "I have sweaty feet"   69 y.o. male presents with the above complaint. History confirmed with patient. On coumadin.  Objective:  Physical Exam: warm, good capillary refill, no trophic changes or ulcerative lesions, normal DP and PT pulses and normal sensory exam. HPKs right heel, forefoot, left submet 5, plantar heel x2  Assessment:   1. Onychomycosis   2. Porokeratosis   3. Coagulation defect [facility])    Plan:  Patient was evaluated and treated and all questions answered.  Onychomycosis and Coagulation Defect -Nails palliatively debrided secondary to pain  Procedure: Nail Debridement Rationale: Patient meets criteria for routine foot care due to coag Type of Debridement: manual, sharp debridement. Instrumentation: Nail nipper, rotary burr. Number of Nails: 10  Porokeratosis -Educated on etiology  Procedure: Paring of Lesion Rationale: painful hyperkeratotic lesion Type of Debridement: manual, sharp debridement. Instrumentation: 312 blade Number of Lesions: 6  No follow-ups on file.

## 2019-12-18 ENCOUNTER — Ambulatory Visit (INDEPENDENT_AMBULATORY_CARE_PROVIDER_SITE_OTHER): Payer: Medicare Other

## 2019-12-18 DIAGNOSIS — I495 Sick sinus syndrome: Secondary | ICD-10-CM

## 2019-12-20 LAB — CUP PACEART REMOTE DEVICE CHECK
Battery Remaining Longevity: 142 mo
Battery Voltage: 3.02 V
Brady Statistic AP VP Percent: 0.02 %
Brady Statistic AP VS Percent: 28.63 %
Brady Statistic AS VP Percent: 0.03 %
Brady Statistic AS VS Percent: 71.32 %
Brady Statistic RA Percent Paced: 28.63 %
Brady Statistic RV Percent Paced: 0.06 %
Date Time Interrogation Session: 20211013010326
Implantable Lead Implant Date: 20180410
Implantable Lead Implant Date: 20180410
Implantable Lead Location: 753859
Implantable Lead Location: 753860
Implantable Lead Model: 3830
Implantable Lead Model: 5076
Implantable Pulse Generator Implant Date: 20180410
Lead Channel Impedance Value: 304 Ohm
Lead Channel Impedance Value: 323 Ohm
Lead Channel Impedance Value: 456 Ohm
Lead Channel Impedance Value: 494 Ohm
Lead Channel Pacing Threshold Amplitude: 0.625 V
Lead Channel Pacing Threshold Amplitude: 1.5 V
Lead Channel Pacing Threshold Pulse Width: 0.4 ms
Lead Channel Pacing Threshold Pulse Width: 0.4 ms
Lead Channel Sensing Intrinsic Amplitude: 2.625 mV
Lead Channel Sensing Intrinsic Amplitude: 2.625 mV
Lead Channel Sensing Intrinsic Amplitude: 9.25 mV
Lead Channel Sensing Intrinsic Amplitude: 9.25 mV
Lead Channel Setting Pacing Amplitude: 1.5 V
Lead Channel Setting Pacing Amplitude: 2 V
Lead Channel Setting Pacing Pulse Width: 1 ms
Lead Channel Setting Sensing Sensitivity: 2 mV

## 2019-12-24 NOTE — Progress Notes (Signed)
Remote pacemaker transmission.   

## 2019-12-25 ENCOUNTER — Ambulatory Visit (INDEPENDENT_AMBULATORY_CARE_PROVIDER_SITE_OTHER): Payer: Medicare Other | Admitting: *Deleted

## 2019-12-25 ENCOUNTER — Other Ambulatory Visit: Payer: Self-pay

## 2019-12-25 DIAGNOSIS — Z5181 Encounter for therapeutic drug level monitoring: Secondary | ICD-10-CM | POA: Diagnosis not present

## 2019-12-25 DIAGNOSIS — I480 Paroxysmal atrial fibrillation: Secondary | ICD-10-CM

## 2019-12-25 LAB — POCT INR: INR: 3.3 — AB (ref 2.0–3.0)

## 2019-12-25 NOTE — Patient Instructions (Addendum)
Description   Do not take any Warfarin tomorrow then continue taking 1/2 tablet daily except 1 tablet on Mondays and Fridays. Be consistent with your green vegetable intake. Recheck INR in 3 weeks. Call Coumadin clinic with new medications #[phone]

## 2020-01-04 ENCOUNTER — Other Ambulatory Visit: Payer: Self-pay | Admitting: Internal Medicine

## 2020-01-04 DIAGNOSIS — I480 Paroxysmal atrial fibrillation: Secondary | ICD-10-CM

## 2020-01-15 ENCOUNTER — Other Ambulatory Visit: Payer: Self-pay

## 2020-01-15 ENCOUNTER — Ambulatory Visit (INDEPENDENT_AMBULATORY_CARE_PROVIDER_SITE_OTHER): Payer: Medicare Other | Admitting: *Deleted

## 2020-01-15 DIAGNOSIS — I480 Paroxysmal atrial fibrillation: Secondary | ICD-10-CM | POA: Diagnosis not present

## 2020-01-15 DIAGNOSIS — Z5181 Encounter for therapeutic drug level monitoring: Secondary | ICD-10-CM | POA: Diagnosis not present

## 2020-01-15 LAB — POCT INR: INR: 3 (ref 2.0–3.0)

## 2020-01-15 NOTE — Patient Instructions (Signed)
Description   Continue taking 1/2 tablet daily except 1 tablet on Mondays and Fridays. Add another leafy veggie to your diet and be consistent with your green vegetable intake. Recheck INR in 4 weeks. Call Coumadin clinic with new medications #[phone]

## 2020-02-12 ENCOUNTER — Ambulatory Visit (INDEPENDENT_AMBULATORY_CARE_PROVIDER_SITE_OTHER): Payer: Medicare Other | Admitting: *Deleted

## 2020-02-12 ENCOUNTER — Other Ambulatory Visit: Payer: Self-pay

## 2020-02-12 DIAGNOSIS — I480 Paroxysmal atrial fibrillation: Secondary | ICD-10-CM | POA: Diagnosis not present

## 2020-02-12 DIAGNOSIS — Z5181 Encounter for therapeutic drug level monitoring: Secondary | ICD-10-CM | POA: Diagnosis not present

## 2020-02-12 LAB — POCT INR: INR: 3.4 — AB (ref 2.0–3.0)

## 2020-02-12 NOTE — Patient Instructions (Signed)
Description   Do not take any Warfarin tomorrow (already taken today's dose) then continue taking 1/2 tablet daily except 1 tablet on Mondays and Fridays. Stay consistent with your green vegetable intake. Recheck INR in 4 weeks. Call Coumadin clinic with new medications #[phone]

## 2020-02-21 ENCOUNTER — Other Ambulatory Visit: Payer: Self-pay | Admitting: Internal Medicine

## 2020-02-21 DIAGNOSIS — I480 Paroxysmal atrial fibrillation: Secondary | ICD-10-CM

## 2020-03-11 ENCOUNTER — Ambulatory Visit (INDEPENDENT_AMBULATORY_CARE_PROVIDER_SITE_OTHER): Payer: Medicare Other | Admitting: *Deleted

## 2020-03-11 ENCOUNTER — Other Ambulatory Visit: Payer: Self-pay

## 2020-03-11 DIAGNOSIS — Z5181 Encounter for therapeutic drug level monitoring: Secondary | ICD-10-CM

## 2020-03-11 DIAGNOSIS — I480 Paroxysmal atrial fibrillation: Secondary | ICD-10-CM

## 2020-03-11 LAB — POCT INR: INR: 3.2 — AB (ref 2.0–3.0)

## 2020-03-11 NOTE — Patient Instructions (Addendum)
Description   Start taking warfarin 1/2  a tablet daily except for 1 tablet on Mondays.  Eat a extra serving of greens today. Recheck INR in 3 weeks. Call Coumadin clinic with new medications #[phone]

## 2020-03-18 ENCOUNTER — Ambulatory Visit (INDEPENDENT_AMBULATORY_CARE_PROVIDER_SITE_OTHER): Payer: Medicare Other

## 2020-03-18 DIAGNOSIS — I495 Sick sinus syndrome: Secondary | ICD-10-CM

## 2020-03-20 LAB — CUP PACEART REMOTE DEVICE CHECK
Battery Remaining Longevity: 139 mo
Battery Voltage: 3.02 V
Brady Statistic AP VP Percent: 0.01 %
Brady Statistic AP VS Percent: 16.65 %
Brady Statistic AS VP Percent: 0.03 %
Brady Statistic AS VS Percent: 83.3 %
Brady Statistic RA Percent Paced: 16.69 %
Brady Statistic RV Percent Paced: 0.04 %
Date Time Interrogation Session: 20220112000439
Implantable Lead Implant Date: 20180410
Implantable Lead Implant Date: 20180410
Implantable Lead Location: 753859
Implantable Lead Location: 753860
Implantable Lead Model: 3830
Implantable Lead Model: 5076
Implantable Pulse Generator Implant Date: 20180410
Lead Channel Impedance Value: 304 Ohm
Lead Channel Impedance Value: 323 Ohm
Lead Channel Impedance Value: 437 Ohm
Lead Channel Impedance Value: 475 Ohm
Lead Channel Pacing Threshold Amplitude: 0.5 V
Lead Channel Pacing Threshold Amplitude: 1.5 V
Lead Channel Pacing Threshold Pulse Width: 0.4 ms
Lead Channel Pacing Threshold Pulse Width: 0.4 ms
Lead Channel Sensing Intrinsic Amplitude: 3.375 mV
Lead Channel Sensing Intrinsic Amplitude: 3.375 mV
Lead Channel Sensing Intrinsic Amplitude: 8 mV
Lead Channel Sensing Intrinsic Amplitude: 8 mV
Lead Channel Setting Pacing Amplitude: 1.5 V
Lead Channel Setting Pacing Amplitude: 2 V
Lead Channel Setting Pacing Pulse Width: 1 ms
Lead Channel Setting Sensing Sensitivity: 2 mV

## 2020-03-31 ENCOUNTER — Encounter: Payer: Self-pay | Admitting: Podiatry

## 2020-03-31 ENCOUNTER — Other Ambulatory Visit: Payer: Self-pay

## 2020-03-31 ENCOUNTER — Ambulatory Visit (INDEPENDENT_AMBULATORY_CARE_PROVIDER_SITE_OTHER): Payer: Medicare Other | Admitting: Podiatry

## 2020-03-31 DIAGNOSIS — B351 Tinea unguium: Secondary | ICD-10-CM

## 2020-03-31 DIAGNOSIS — Q828 Other specified congenital malformations of skin: Secondary | ICD-10-CM

## 2020-03-31 DIAGNOSIS — D689 Coagulation defect, unspecified: Secondary | ICD-10-CM

## 2020-03-31 NOTE — Patient Instructions (Signed)
For extremely dry, cracked feet: moisturize feet once daily; do not apply between toes A. CeraVe Healing Ointment B. Aquaphor Healing Ointment C. Vaseline Petroleum Healing Jelly   If you have problems reaching your feet: apply to feet once daily; do not apply between toes A.  Aquaphor Advanced Therapy Ointment Body Spray B.  Vaseline Intensive Care Spray Lotion Advanced Repair   

## 2020-04-01 ENCOUNTER — Ambulatory Visit (INDEPENDENT_AMBULATORY_CARE_PROVIDER_SITE_OTHER): Payer: Medicare Other | Admitting: *Deleted

## 2020-04-01 DIAGNOSIS — Z5181 Encounter for therapeutic drug level monitoring: Secondary | ICD-10-CM | POA: Diagnosis not present

## 2020-04-01 DIAGNOSIS — I480 Paroxysmal atrial fibrillation: Secondary | ICD-10-CM

## 2020-04-01 LAB — POCT INR: INR: 2.4 (ref 2.0–3.0)

## 2020-04-01 NOTE — Patient Instructions (Addendum)
Description   Continue taking warfarin 1/2 tablet daily except for 1 tablet on Mondays. Recheck INR in 4 weeks. Call Coumadin clinic with new medications #[phone]

## 2020-04-01 NOTE — Progress Notes (Signed)
Remote pacemaker transmission.   

## 2020-04-04 NOTE — Progress Notes (Signed)
Subjective:  Patient ID: Patrick Kelly, male    DOB: Aug 14, 1950,  MRN: [id number]  Patrick Kelly presents to clinic today for at risk foot care. Patient has history of coagulation defect and painful porokeratotic lesion(s) b/l feet and painful mycotic toenails that limit ambulation. Painful toenails interfere with ambulation. Aggravating factors include wearing enclosed shoe gear. Pain is relieved with periodic professional debridement. Painful porokeratotic lesions are aggravated when weightbearing with and without shoegear. Pain is relieved with periodic professional debridement..  70 y.o. male presents with the above complaint.    Review of Systems: Negative except as noted in the HPI. Past Medical History:  Diagnosis Date  . Abnormal CT of the chest    emphysemetous changes suggestive copd  . Chronic bilateral low back pain without sciatica 11/08/2018  . Compression fracture of L2 lumbar vertebra (HCC) 11/20/2018  . Dental caries   . Emphysema lung (HCC) 07/12/2016  . Essential hypertension   . Multiple fractures of ribs   . PAF (paroxysmal atrial fibrillation) (HCC)   . Peripheral vascular obstructive disease (HCC)   . Pulmonary nodule   . 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 Seizure (HCC) 03/05/2018  . Sinusitis   . Syncope   . Tobacco use    Past Surgical History:  Procedure Laterality Date  . PACEMAKER IMPLANT N/A 06/14/2016   Procedure: Pacemaker Implant-Dual chamber;  Surgeon: [name], MD;  Location: [facility] INVASIVE CV LAB;  Service: Cardiovascular;  Laterality: N/A;    Current Outpatient Medications:  .  levETIRAcetam (KEPPRA) 500 MG tablet, Take 1 tablet (500 mg total) by mouth 2 (two) times daily., Disp: 180 tablet, Rfl: 3 .  metoprolol tartrate (LOPRESSOR) 25 MG tablet, Take 1 tablet (25 mg total) by mouth 2 (two) times daily., Disp: 180 tablet, Rfl: 3 .  vitamin B-12 (CYANOCOBALAMIN) 1000 MCG tablet, Take 1 tablet (1,000 mcg total) by mouth daily.,  Disp: 30 tablet, Rfl: 0 .  Vitamin D, Ergocalciferol, (DRISDOL) 1.25 MG (50000 UNIT) CAPS capsule, TAKE ONE CAPSULE BY MOUTH ONCE WEEKLY, Disp: 9 capsule, Rfl: 0 .  warfarin (COUMADIN) 5 MG tablet, TAKE ONE-HALF TO ONE TABLET BY MOUTH DAILY AS DIRECTED BY COUMADIN CLINIC, Disp: 80 tablet, Rfl: 1 No Known Allergies Social History   Occupational History  . Occupation: Charity fundraiser  Tobacco Use  . Smoking status: Current Every Day Smoker    Packs/day: 1.00    Types: Cigarettes  . Smokeless tobacco: Never Used  . Tobacco comment: Does not inhale cigarette smoke  Vaping Use  . Vaping Use: Never used  Substance and Sexual Activity  . Alcohol use: No  . Drug use: Not Currently  . Sexual activity: Not on file    Objective:   Constitutional Patrick Kelly is a pleasant 69 y.o. Caucasian male, in NAD. AAO x 3.   Vascular Capillary fill time to digits <3 seconds b/l lower extremities. Palpable pedal pulses b/l LE. Pedal hair sparse. Lower extremity skin temperature gradient within normal limits. No pain with calf compression b/l. No cyanosis or clubbing noted.  Neurologic Normal speech. Oriented to person, place, and time. Protective sensation intact 5/5 intact bilaterally with 10g monofilament b/l. Vibratory sensation intact b/l.  Dermatologic Pedal skin with normal turgor, texture and tone bilaterally. No open wounds bilaterally. No interdigital macerations bilaterally. Toenails 1-5 b/l elongated, discolored, dystrophic, thickened, crumbly with subungual debris and tenderness to dorsal palpation. Porokeratotic lesion(s) submet head 2 right foot, submet head 5 left foot and plantar  aspect b/l heel pads. No erythema, no edema, no drainage, no fluctuance.  Orthopedic: Normal muscle strength 5/5 to all lower extremity muscle groups bilaterally. No pain crepitus or joint limitation noted with ROM b/l. Patient ambulates independent of any assistive aids.   Radiographs: None Assessment:   1.  Onychomycosis   2. Porokeratosis   3. Coagulation defect [facility])    Plan:  Patient was evaluated and treated and all questions answered.  Onychomycosis with pain -Nails palliatively debridement as below -Educated on self-care  Procedure: Nail Debridement Rationale: Pain Type of Debridement: manual, sharp debridement. Instrumentation: Nail nipper, rotary burr. Number of Nails: 10 -Examined patient. -No new findings. No new orders. -Patient to continue soft, supportive shoe gear daily. -Toenails 1-5 b/l were debrided in length and girth with sterile nail nippers and dremel without iatrogenic bleeding.  -Painful porokeratotic lesion(s) submet head 2 right foot, submet head 5 left foot and plantar aspect b/l heel pads pared and enucleated with sterile scalpel blade without incident. -Patient to report any pedal injuries to medical professional immediately. -Patient/POA to call should there be question/concern in the interim.  Return in about 3 months (around 06/29/2020).  Freddie Breech, DPM

## 2020-04-10 IMAGING — DX DG CHEST 2V
2 series · 2 of 2 positions shown · non-contrast
Comparison: 06/15/2016

CLINICAL DATA: Syncope.

EXAM:
CHEST - 2 VIEW

[chest lat]
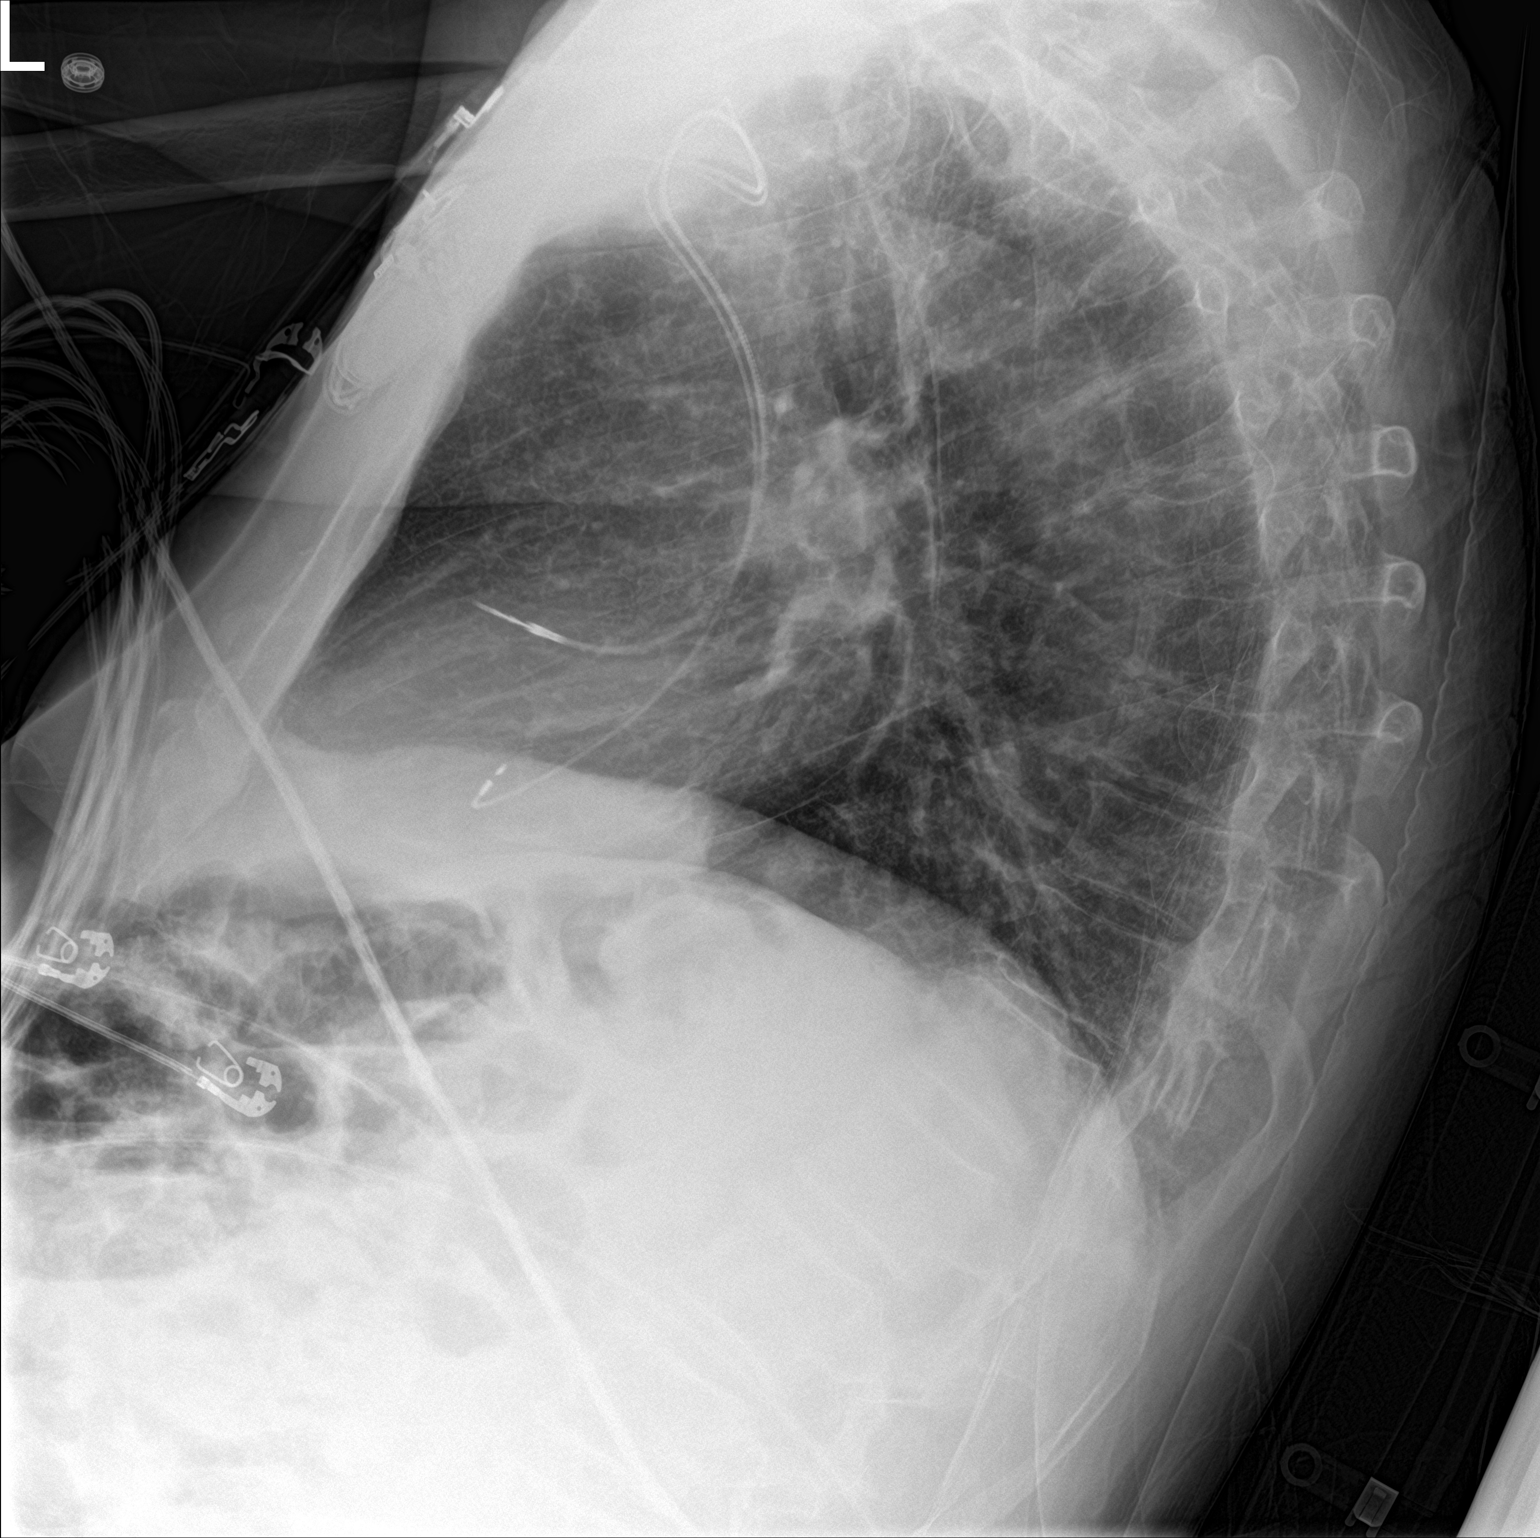

[chest ap]
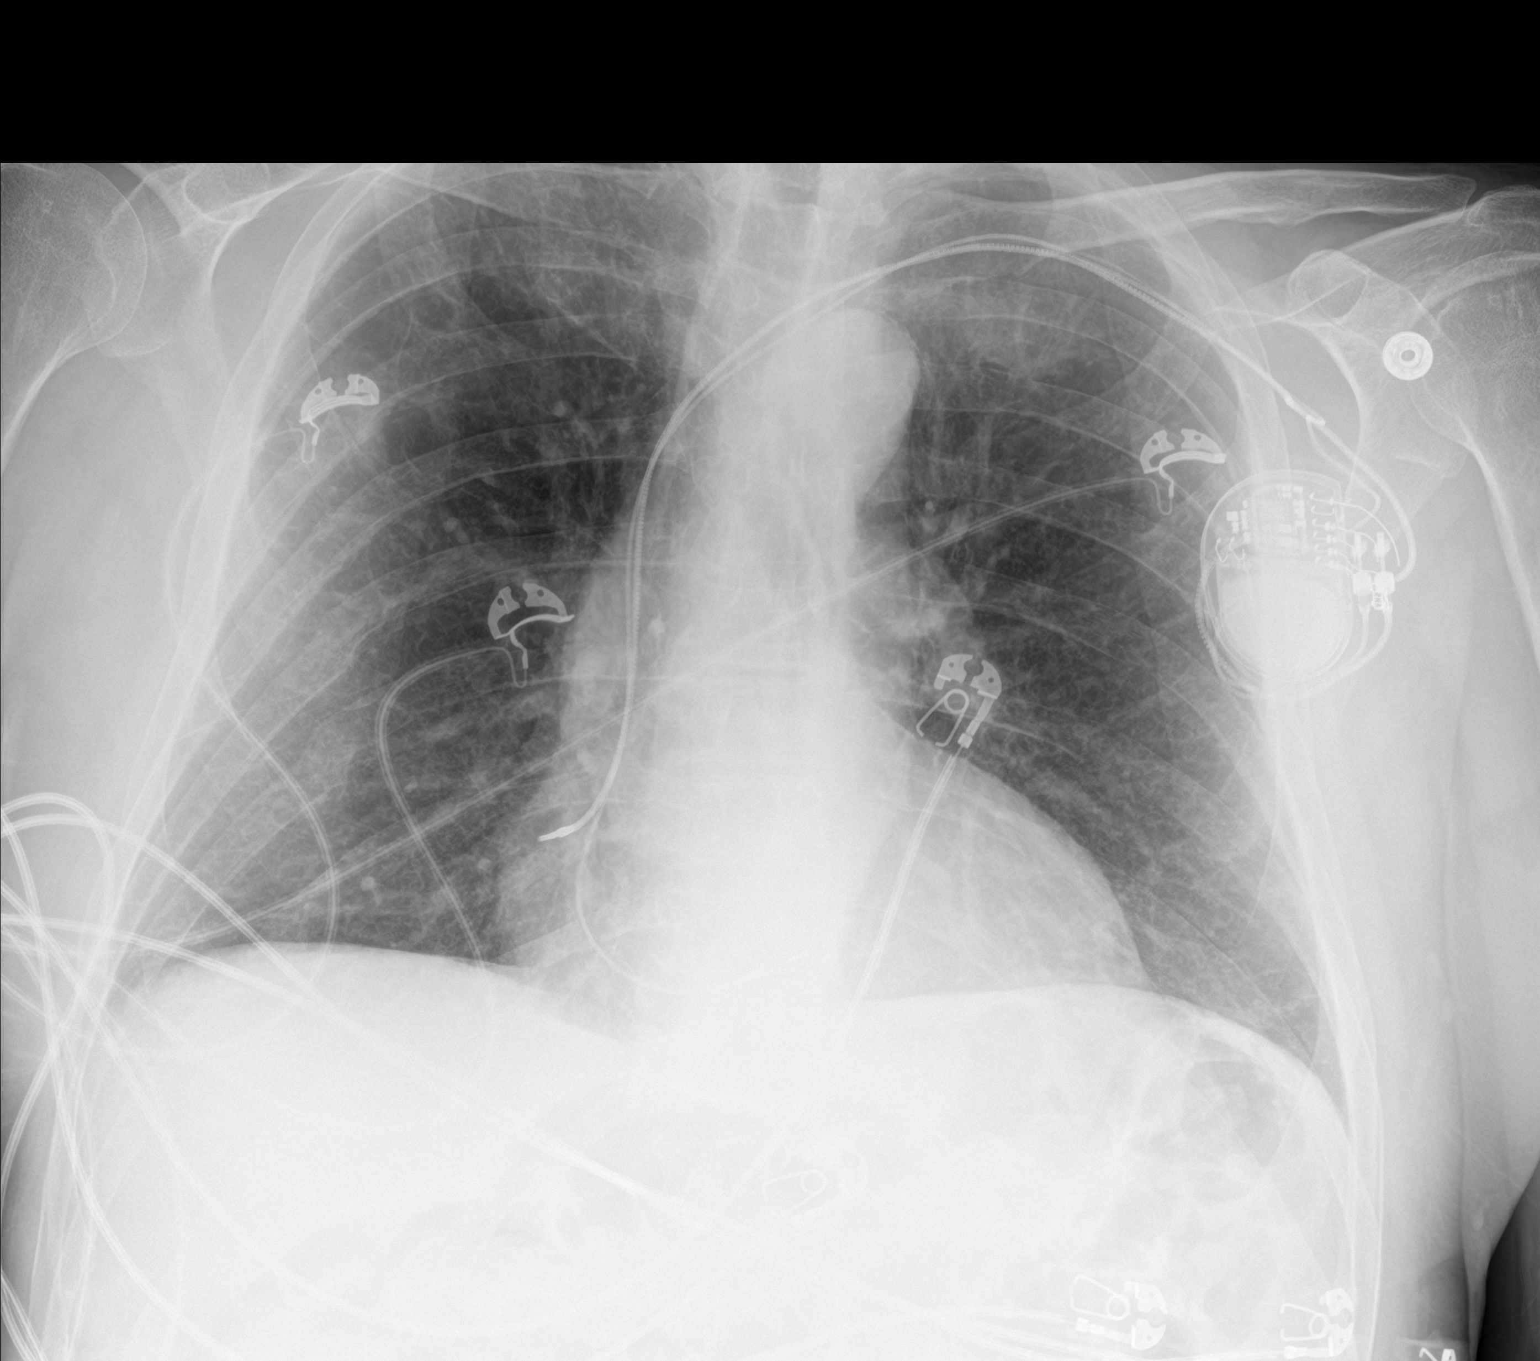

[2 of 2 positions shown; findings below may reference images not displayed]

FINDINGS: Left pacer remains in place, unchanged. Heart is normal size. No
confluent airspace opacities or effusions. No acute bony
abnormality.
IMPRESSION: No active cardiopulmonary disease.

## 2020-04-29 ENCOUNTER — Other Ambulatory Visit: Payer: Self-pay

## 2020-04-29 ENCOUNTER — Ambulatory Visit (INDEPENDENT_AMBULATORY_CARE_PROVIDER_SITE_OTHER): Payer: Medicare Other | Admitting: *Deleted

## 2020-04-29 DIAGNOSIS — I480 Paroxysmal atrial fibrillation: Secondary | ICD-10-CM

## 2020-04-29 DIAGNOSIS — Z5181 Encounter for therapeutic drug level monitoring: Secondary | ICD-10-CM | POA: Diagnosis not present

## 2020-04-29 LAB — POCT INR: INR: 2.5 (ref 2.0–3.0)

## 2020-04-29 NOTE — Patient Instructions (Signed)
Description   Continue taking warfarin 1/2 tablet daily except for 1 tablet on Mondays. Recheck INR in 5 weeks. Call Coumadin clinic with new medications #[phone]

## 2020-05-09 ENCOUNTER — Other Ambulatory Visit: Payer: Self-pay | Admitting: Internal Medicine

## 2020-05-09 DIAGNOSIS — I480 Paroxysmal atrial fibrillation: Secondary | ICD-10-CM

## 2020-05-31 ENCOUNTER — Other Ambulatory Visit: Payer: Self-pay | Admitting: Student

## 2020-05-31 DIAGNOSIS — I480 Paroxysmal atrial fibrillation: Secondary | ICD-10-CM

## 2020-06-03 ENCOUNTER — Other Ambulatory Visit: Payer: Self-pay | Admitting: Student

## 2020-06-03 DIAGNOSIS — I480 Paroxysmal atrial fibrillation: Secondary | ICD-10-CM

## 2020-06-10 ENCOUNTER — Other Ambulatory Visit: Payer: Self-pay

## 2020-06-10 ENCOUNTER — Ambulatory Visit (INDEPENDENT_AMBULATORY_CARE_PROVIDER_SITE_OTHER): Payer: Medicare Other | Admitting: *Deleted

## 2020-06-10 DIAGNOSIS — Z5181 Encounter for therapeutic drug level monitoring: Secondary | ICD-10-CM | POA: Diagnosis not present

## 2020-06-10 DIAGNOSIS — I480 Paroxysmal atrial fibrillation: Secondary | ICD-10-CM | POA: Diagnosis not present

## 2020-06-10 LAB — POCT INR: INR: 2.5 (ref 2.0–3.0)

## 2020-06-10 NOTE — Patient Instructions (Signed)
Description   Continue taking warfarin 1/2 tablet daily except for 1 tablet on Mondays. Recheck INR in 6 weeks. Call Coumadin clinic with new medications #[phone]      

## 2020-06-17 ENCOUNTER — Ambulatory Visit (INDEPENDENT_AMBULATORY_CARE_PROVIDER_SITE_OTHER): Payer: Medicare Other

## 2020-06-17 DIAGNOSIS — I495 Sick sinus syndrome: Secondary | ICD-10-CM

## 2020-06-19 LAB — CUP PACEART REMOTE DEVICE CHECK
Battery Remaining Longevity: 136 mo
Battery Voltage: 3.01 V
Brady Statistic AP VP Percent: 0.01 %
Brady Statistic AP VS Percent: 21.42 %
Brady Statistic AS VP Percent: 0.03 %
Brady Statistic AS VS Percent: 78.54 %
Brady Statistic RA Percent Paced: 21.33 %
Brady Statistic RV Percent Paced: 0.06 %
Date Time Interrogation Session: 20220413010620
Implantable Lead Implant Date: 20180410
Implantable Lead Implant Date: 20180410
Implantable Lead Location: 753859
Implantable Lead Location: 753860
Implantable Lead Model: 3830
Implantable Lead Model: 5076
Implantable Pulse Generator Implant Date: 20180410
Lead Channel Impedance Value: 285 Ohm
Lead Channel Impedance Value: 304 Ohm
Lead Channel Impedance Value: 456 Ohm
Lead Channel Impedance Value: 456 Ohm
Lead Channel Pacing Threshold Amplitude: 0.5 V
Lead Channel Pacing Threshold Amplitude: 1.5 V
Lead Channel Pacing Threshold Pulse Width: 0.4 ms
Lead Channel Pacing Threshold Pulse Width: 0.4 ms
Lead Channel Sensing Intrinsic Amplitude: 2.75 mV
Lead Channel Sensing Intrinsic Amplitude: 2.75 mV
Lead Channel Sensing Intrinsic Amplitude: 7.125 mV
Lead Channel Sensing Intrinsic Amplitude: 7.125 mV
Lead Channel Setting Pacing Amplitude: 1.5 V
Lead Channel Setting Pacing Amplitude: 2 V
Lead Channel Setting Pacing Pulse Width: 1 ms
Lead Channel Setting Sensing Sensitivity: 2 mV

## 2020-07-01 NOTE — Progress Notes (Signed)
Remote pacemaker transmission.   

## 2020-07-06 ENCOUNTER — Other Ambulatory Visit: Payer: Self-pay | Admitting: Internal Medicine

## 2020-07-06 DIAGNOSIS — I480 Paroxysmal atrial fibrillation: Secondary | ICD-10-CM

## 2020-07-10 ENCOUNTER — Ambulatory Visit (INDEPENDENT_AMBULATORY_CARE_PROVIDER_SITE_OTHER): Payer: Medicare Other | Admitting: Podiatry

## 2020-07-10 ENCOUNTER — Encounter: Payer: Self-pay | Admitting: Podiatry

## 2020-07-10 ENCOUNTER — Other Ambulatory Visit: Payer: Self-pay

## 2020-07-10 DIAGNOSIS — D689 Coagulation defect, unspecified: Secondary | ICD-10-CM | POA: Diagnosis not present

## 2020-07-10 DIAGNOSIS — Q828 Other specified congenital malformations of skin: Secondary | ICD-10-CM

## 2020-07-10 DIAGNOSIS — B351 Tinea unguium: Secondary | ICD-10-CM | POA: Diagnosis not present

## 2020-07-14 ENCOUNTER — Encounter: Payer: Medicare Other | Admitting: Student

## 2020-07-18 NOTE — Progress Notes (Signed)
Subjective: Patrick Kelly is a pleasant 70 y.o. male patient seen today with h/o coagulation defect, corns and painful thick toenails that are difficult to trim. Pain interferes with ambulation. Aggravating factors include wearing enclosed shoe gear. Pain is relieved with periodic professional debridement.  PCP is Dr. Risa Grill.  Last visit was August 28, 2019.  He voices no new pedal problems on today's visit.  No Known Allergies  Objective: Physical Exam  General: Mr. Konrad Dolores is a 70 year old Caucasian male, well developed, well nourished in no acute distress.  Alert, awake and oriented x3.  Vascular examination: Capillary refill time is less than 3 seconds to all digits bilaterally.  Dorsalis pedis and posterior tibial pulses are palpable bilaterally.  Pedal hair is sparse bilaterally.  Skin temperature gradient is within normal limits bilaterally.  There is no pain with calf compression bilaterally.  Dermatological examination: Skin is noted to possess normal turgor texture and tone bilaterally.  There are no open wounds bilaterally.  No interdigital macerations noted bilaterally.  Toenails 1 through 5 bilaterally are noted to be elongated, dystrophic, discolored, with subungual debris and tenderness to palpation.  He also has porokeratotic lesions submetatarsal head to the right foot submetatarsal head 5 of the left foot and the plantar aspect of both heels bilaterally.  Musculoskeletal examination: Muscle strength is noted to be 5/5 to all muscle groups bilaterally.  There is no pain, crepitus or joint limitation noted bilaterally.  He does ambulate without assistive devices.  Neurological examination: Protective sensation is noted to be intact 5/5 bilaterally as tested with 10 g monofilament.  Assessment and Plan:  1. Onychomycosis   2. Porokeratosis   3. Coagulation defect [facility])      Patient was examined on today's visit. Painful mycotic nails 1 through 5 bilaterally were  debrided in length and girth without complication. Painful porokeratotic lesions submetatarsal head 2 right foot, submetatarsal head 5 left foot and plantar aspect of both heels 4.  Enucleated with sterile scalpel blade without incident.  He is to continue soft supportive shoe gear daily.  Patient is to report any new pedal injuries to a medical professional immediately. Patient is to call the office if he has any questions or concerns in the interim before his next appointment.  Return in about 3 months (around 10/10/2020).  Freddie Breech, DPM

## 2020-07-22 ENCOUNTER — Ambulatory Visit (INDEPENDENT_AMBULATORY_CARE_PROVIDER_SITE_OTHER): Payer: Medicare Other | Admitting: *Deleted

## 2020-07-22 ENCOUNTER — Other Ambulatory Visit: Payer: Self-pay

## 2020-07-22 DIAGNOSIS — I480 Paroxysmal atrial fibrillation: Secondary | ICD-10-CM

## 2020-07-22 DIAGNOSIS — Z5181 Encounter for therapeutic drug level monitoring: Secondary | ICD-10-CM

## 2020-07-22 LAB — POCT INR: INR: 3 (ref 2.0–3.0)

## 2020-07-22 NOTE — Patient Instructions (Signed)
Description   Continue taking warfarin 1/2 tablet daily except for 1 tablet on Mondays. Recheck INR in 6 weeks. Call Coumadin clinic with new medications #[phone]      

## 2020-07-29 ENCOUNTER — Ambulatory Visit (INDEPENDENT_AMBULATORY_CARE_PROVIDER_SITE_OTHER): Payer: Medicare Other | Admitting: Student

## 2020-07-29 ENCOUNTER — Other Ambulatory Visit: Payer: Self-pay

## 2020-07-29 ENCOUNTER — Encounter: Payer: Self-pay | Admitting: Student

## 2020-07-29 VITALS — BP 130/78 | HR 60 | Ht 67.0 in | Wt 170.6 lb

## 2020-07-29 DIAGNOSIS — F17210 Nicotine dependence, cigarettes, uncomplicated: Secondary | ICD-10-CM

## 2020-07-29 DIAGNOSIS — I10 Essential (primary) hypertension: Secondary | ICD-10-CM | POA: Diagnosis not present

## 2020-07-29 DIAGNOSIS — I480 Paroxysmal atrial fibrillation: Secondary | ICD-10-CM

## 2020-07-29 DIAGNOSIS — I495 Sick sinus syndrome: Secondary | ICD-10-CM | POA: Diagnosis not present

## 2020-07-29 LAB — CUP PACEART INCLINIC DEVICE CHECK
Battery Remaining Longevity: 134 mo
Battery Voltage: 3.01 V
Brady Statistic AP VP Percent: 0.02 %
Brady Statistic AP VS Percent: 24.26 %
Brady Statistic AS VP Percent: 0.03 %
Brady Statistic AS VS Percent: 75.7 %
Brady Statistic RA Percent Paced: 24.24 %
Brady Statistic RV Percent Paced: 0.06 %
Date Time Interrogation Session: 20220525131031
Implantable Lead Implant Date: 20180410
Implantable Lead Implant Date: 20180410
Implantable Lead Location: 753859
Implantable Lead Location: 753860
Implantable Lead Model: 3830
Implantable Lead Model: 5076
Implantable Pulse Generator Implant Date: 20180410
Lead Channel Impedance Value: 304 Ohm
Lead Channel Impedance Value: 323 Ohm
Lead Channel Impedance Value: 475 Ohm
Lead Channel Impedance Value: 532 Ohm
Lead Channel Pacing Threshold Amplitude: 0.5 V
Lead Channel Pacing Threshold Amplitude: 1.375 V
Lead Channel Pacing Threshold Pulse Width: 0.4 ms
Lead Channel Pacing Threshold Pulse Width: 0.4 ms
Lead Channel Sensing Intrinsic Amplitude: 3.125 mV
Lead Channel Sensing Intrinsic Amplitude: 8.75 mV
Lead Channel Setting Pacing Amplitude: 1.5 V
Lead Channel Setting Pacing Amplitude: 2 V
Lead Channel Setting Pacing Pulse Width: 1 ms
Lead Channel Setting Sensing Sensitivity: 2 mV

## 2020-07-29 LAB — CBC
Hematocrit: 40.7 % (ref 37.5–51.0)
Hemoglobin: 14 g/dL (ref 13.0–17.7)
MCH: 33.8 pg — ABNORMAL HIGH (ref 26.6–33.0)
MCHC: 34.4 g/dL (ref 31.5–35.7)
MCV: 98 fL — ABNORMAL HIGH (ref 79–97)
Platelets: 158 10*3/uL (ref 150–450)
RBC: 4.14 x10E6/uL (ref 4.14–5.80)
RDW: 11.9 % (ref 11.6–15.4)
WBC: 5.3 10*3/uL (ref 3.4–10.8)

## 2020-07-29 LAB — BASIC METABOLIC PANEL
BUN/Creatinine Ratio: 14 (ref 10–24)
BUN: 13 mg/dL (ref 8–27)
CO2: 25 mmol/L (ref 20–29)
Calcium: 8.7 mg/dL (ref 8.6–10.2)
Chloride: 101 mmol/L (ref 96–106)
Creatinine, Ser: 0.96 mg/dL (ref 0.76–1.27)
Glucose: 69 mg/dL (ref 65–99)
Potassium: 4.1 mmol/L (ref 3.5–5.2)
Sodium: 138 mmol/L (ref 134–144)
eGFR: 85 mL/min/{1.73_m2} (ref >59)

## 2020-07-29 MED ORDER — METOPROLOL TARTRATE 25 MG PO TABS: 25.0000 mg | ORAL_TABLET | Freq: Two times a day (BID) | ORAL | 3 refills | Status: DC

## 2020-07-29 NOTE — Patient Instructions (Signed)
Medication Instructions:  Your physician recommends that you continue on your current medications as directed. Please refer to the Current Medication list given to you today.  *If you need a refill on your cardiac medications before your next appointment, please call your pharmacy*   Lab Work: TODAY: BMET, CBC  If you have labs (blood work) drawn today and your tests are completely normal, you will receive your results only by: Marland Kitchen MyChart Message (if you have MyChart) OR . A paper copy in the mail If you have any lab test that is abnormal or we need to change your treatment, we will call you to review the results.   Follow-Up: At [facility], you and your health needs are our priority.  As part of our continuing mission to provide you with exceptional heart care, we have created designated Provider Care Teams.  These Care Teams include your primary Cardiologist (physician) and Advanced Practice Providers (APPs -  Physician Assistants and Nurse Practitioners) who all work together to provide you with the care you need, when you need it.  We recommend signing up for the patient portal called "MyChart".  Sign up information is provided on this After Visit Summary.  MyChart is used to connect with patients for Virtual Visits (Telemedicine).  Patients are able to view lab/test results, encounter notes, upcoming appointments, etc.  Non-urgent messages can be sent to your provider as well.   To learn more about what you can do with MyChart, go to [email or web address].    Your next appointment:   1 year(s)  The format for your next appointment:   In Person  Provider:    You may see Lewayne Bunting, MD or one of the following Advanced Practice Providers on your designated Care Team:    Casimiro Needle "Mardelle Matte" [place], [place]

## 2020-07-29 NOTE — Progress Notes (Signed)
Electrophysiology Office Note Date: 07/29/2020  ID:  Patrick Kelly, DOB 02-Sep-1950, MRN [id number]  PCP: Hoy Register, MD Primary Cardiologist: None Electrophysiologist: Lewayne Bunting, MD   CC: Pacemaker follow-up  Patrick Kelly is a 70 y.o. male seen today for Lewayne Bunting, MD for routine electrophysiology followup.  Since last being seen in our clinic the patient reports doing very well overall.  he denies chest pain, palpitations, dyspnea, PND, orthopnea, nausea, vomiting, dizziness, syncope, edema, weight gain, or early satiety. He continues to smoke but "does not inhale".  Device History: Medtronic Dual Chamber PPM implanted 06/2016 for SND  Past Medical History:  Diagnosis Date  . Abnormal CT of the chest    emphysemetous changes suggestive copd  . Chronic bilateral low back pain without sciatica 11/08/2018  . Compression fracture of L2 lumbar vertebra (HCC) 11/20/2018  . Dental caries   . Emphysema lung (HCC) 07/12/2016  . Essential hypertension   . Multiple fractures of ribs   . PAF (paroxysmal atrial fibrillation) (HCC)   . Peripheral vascular obstructive disease (HCC)   . Pulmonary nodule   . 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 Seizure (HCC) 03/05/2018  . Sinusitis   . Syncope   . Tobacco use    Past Surgical History:  Procedure Laterality Date  . PACEMAKER IMPLANT N/A 06/14/2016   Procedure: Pacemaker Implant-Dual chamber;  Surgeon: [name], MD;  Location: [facility] INVASIVE CV LAB;  Service: Cardiovascular;  Laterality: N/A;    Current Outpatient Medications  Medication Sig Dispense Refill  . levETIRAcetam (KEPPRA) 500 MG tablet Take 1 tablet (500 mg total) by mouth 2 (two) times daily. 180 tablet 3  . vitamin B-12 (CYANOCOBALAMIN) 1000 MCG tablet Take 1 tablet (1,000 mcg total) by mouth daily. 30 tablet 0  . warfarin (COUMADIN) 5 MG tablet TAKE 1/2 TO 1 TABLET BY MOUTH DAILY AS DIRECTED BY COUMADIN CLINIC 70 tablet 1  . metoprolol tartrate  (LOPRESSOR) 25 MG tablet Take 1 tablet (25 mg total) by mouth 2 (two) times daily. 180 tablet 3   No current facility-administered medications for this visit.    Allergies:   Patient has no known allergies.   Social History: Social History   Socioeconomic History  . Marital status: Single    Spouse name: Not on file  . Number of children: Not on file  . Years of education: 51  . Highest education level: Bachelor's degree (e.g., BA, AB, BS)  Occupational History  . Occupation: Charity fundraiser  Tobacco Use  . Smoking status: Current Every Day Smoker    Packs/day: 1.00    Types: Cigarettes  . Smokeless tobacco: Never Used  . Tobacco comment: Does not inhale cigarette smoke  Vaping Use  . Vaping Use: Never used  Substance and Sexual Activity  . Alcohol use: No  . Drug use: Not Currently  . Sexual activity: Not on file  Other Topics Concern  . Not on file  Social History Narrative   Live Apt alone.  Working as a Charity fundraiser.  Barista.      Social Determinants of Health   Financial Resource Strain: Not on file  Food Insecurity: Not on file  Transportation Needs: Not on file  Physical Activity: Not on file  Stress: Not on file  Social Connections: Not on file  Intimate Partner Violence: Not on file    Family History: Family History  Problem Relation Age of Onset  . Hypertension Sister   . Healthy Mother   .  Seizures Neg Hx      Review of Systems: All other systems reviewed and are otherwise negative except as noted above.  Physical Exam: Vitals:   07/29/20 1125  BP: 130/78  Pulse: 60  SpO2: 99%  Weight: 170 lb 9.6 oz (77.4 kg)  Height: 5\' 7"  (1.702 m)     GEN- The patient is well appearing, alert and oriented x 3 today.   HEENT: normocephalic, atraumatic; sclera clear, conjunctiva pink; hearing intact; oropharynx clear; neck supple  Lungs- Clear to ausculation bilaterally, normal work of breathing.  No wheezes, rales, rhonchi Heart- Regular rate and rhythm,  no murmurs, rubs or gallops  GI- soft, non-tender, non-distended, bowel sounds present  Extremities- no clubbing or cyanosis. No edema MS- no significant deformity or atrophy Skin- warm and dry, no rash or lesion; PPM pocket well healed Psych- euthymic mood, full affect Neuro- strength and sensation are intact  PPM Interrogation- reviewed in detail today,  See PACEART report  EKG:  EKG is ordered today. The ekg ordered today shows A pacing at 60 bpm  Recent Labs: No results found for requested labs within last 8760 hours.   Wt Readings from Last 3 Encounters:  07/29/20 170 lb 9.6 oz (77.4 kg)  11/14/19 165 lb (74.8 kg)  08/28/19 159 lb (72.1 kg)     Other studies Reviewed: Additional studies/ records that were reviewed today include: Previous EP office notes, Previous remote checks, Most recent labwork.   Assessment and Plan:  1. SND s/p Medtronic PPM  Normal PPM function See PaceArt report No changes today.   2. PAF with variable rate control NSR today. Longest recent episode ~ 2 hours.   Continue lopressor 25 mg BID Daily alert burden increased today.  Continue coumadin for CHA2DS2VASC of at least 4    3. Secondary Hypercoagulable State (ICD10:  D68.69) The patient is at high risk of stroke/thromboembolism with CHA2DS2VASC of at least 4.    Continue coumadin per coumadin clinic.  Denies bleeding.  4. Tobacco abuse Encouraged complete cessation.  Current medicines are reviewed at length with the patient today.   The patient does not have concerns regarding his medicines.  The following changes were made today:  none  Labs/ tests ordered today include:  Orders Placed This Encounter  Procedures  . Basic metabolic panel  . CBC  . EKG 12-Lead   Disposition:   Follow up with Dr. 08/30/19 in 12 Months    Signed, [name]  07/29/2020 11:54 AM  [facility] [address] [place] [id number]  (office) [phone] (fax)

## 2020-08-23 ENCOUNTER — Other Ambulatory Visit: Payer: Self-pay | Admitting: Internal Medicine

## 2020-08-23 DIAGNOSIS — I480 Paroxysmal atrial fibrillation: Secondary | ICD-10-CM

## 2020-08-24 ENCOUNTER — Other Ambulatory Visit: Payer: Self-pay | Admitting: Adult Health

## 2020-08-24 ENCOUNTER — Encounter: Payer: Self-pay | Admitting: Adult Health

## 2020-08-24 ENCOUNTER — Ambulatory Visit (INDEPENDENT_AMBULATORY_CARE_PROVIDER_SITE_OTHER): Payer: Medicare Other | Admitting: Adult Health

## 2020-08-24 VITALS — BP 119/77 | HR 60 | Ht 68.0 in | Wt 167.0 lb

## 2020-08-24 DIAGNOSIS — R569 Unspecified convulsions: Secondary | ICD-10-CM | POA: Diagnosis not present

## 2020-08-24 DIAGNOSIS — E538 Deficiency of other specified B group vitamins: Secondary | ICD-10-CM

## 2020-08-24 DIAGNOSIS — E559 Vitamin D deficiency, unspecified: Secondary | ICD-10-CM

## 2020-08-24 MED ORDER — LEVETIRACETAM 500 MG PO TABS: 500.0000 mg | ORAL_TABLET | Freq: Two times a day (BID) | ORAL | 3 refills | Status: DC

## 2020-08-24 NOTE — Telephone Encounter (Signed)
Outpatient Medication Detail   Disp Refills Start End   metoprolol tartrate (LOPRESSOR) 25 MG tablet 180 tablet 3 07/29/2020    Sig - Route: Take 1 tablet (25 mg total) by mouth 2 (two) times daily. - Oral   Sent to pharmacy as: metoprolol tartrate (LOPRESSOR) 25 MG tablet   E-Prescribing Status: Receipt confirmed by pharmacy (07/29/2020 11:53 AM EDT)     Associated Diagnoses  PAF (paroxysmal atrial fibrillation) [facility])      Pharmacy  [facility] [id number] - [place], [place] - [address]

## 2020-08-24 NOTE — Patient Instructions (Signed)
Your Plan:  Continue keppra 500mg  twice daily for seizure prevention  We will check vit B12 and Vit D deficiency    Follow up in 1 year or call earlier if needed      Thank you for coming to see at [facility]. I hope we have been able to provide you high quality care today.  You may receive a patient satisfaction survey over the next few weeks. We would appreciate your feedback and comments so that we may continue to improve ourselves and the health of our patients.
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
## 2020-08-25 LAB — VITAMIN D 25 HYDROXY (VIT D DEFICIENCY, FRACTURES): Vit D, 25-Hydroxy: 33.4 ng/mL (ref 30.0–100.0)

## 2020-08-25 LAB — VITAMIN B12: Vitamin B-12: 1350 pg/mL — ABNORMAL HIGH (ref 232–1245)

## 2020-09-02 ENCOUNTER — Ambulatory Visit: Payer: Medicare Other | Admitting: *Deleted

## 2020-09-02 ENCOUNTER — Other Ambulatory Visit: Payer: Self-pay

## 2020-09-02 DIAGNOSIS — Z5181 Encounter for therapeutic drug level monitoring: Secondary | ICD-10-CM | POA: Diagnosis not present

## 2020-09-02 DIAGNOSIS — I480 Paroxysmal atrial fibrillation: Secondary | ICD-10-CM

## 2020-09-02 LAB — POCT INR: INR: 3.3 — AB (ref 2.0–3.0)

## 2020-09-02 NOTE — Patient Instructions (Signed)
Description   Do not take any Warfarin tomorrow, then continue taking warfarin 1/2 tablet daily except for 1 tablet on Mondays. Recheck INR in 4 weeks. Call Coumadin clinic with new medications #[phone]

## 2020-09-09 ENCOUNTER — Telehealth: Payer: Self-pay

## 2020-09-09 NOTE — Telephone Encounter (Signed)
DMV pp completed and signed by JM, NP.  Paper work given to medical records for processing. Copy made for filling as well.

## 2020-09-10 ENCOUNTER — Telehealth: Payer: Self-pay | Admitting: *Deleted

## 2020-09-10 NOTE — Telephone Encounter (Signed)
Pt dmv form faxed on 09/10/20. Pt copy @ the front desk for p/u

## 2020-09-16 ENCOUNTER — Ambulatory Visit (INDEPENDENT_AMBULATORY_CARE_PROVIDER_SITE_OTHER): Payer: Medicare Other

## 2020-09-16 DIAGNOSIS — I495 Sick sinus syndrome: Secondary | ICD-10-CM

## 2020-09-16 LAB — CUP PACEART REMOTE DEVICE CHECK
Battery Remaining Longevity: 133 mo
Battery Voltage: 3.01 V
Brady Statistic AP VP Percent: 0.02 %
Brady Statistic AP VS Percent: 27.31 %
Brady Statistic AS VP Percent: 0.03 %
Brady Statistic AS VS Percent: 72.65 %
Brady Statistic RA Percent Paced: 26.8 %
Brady Statistic RV Percent Paced: 0.18 %
Date Time Interrogation Session: 20220713010340
Implantable Lead Implant Date: 20180410
Implantable Lead Implant Date: 20180410
Implantable Lead Location: 753859
Implantable Lead Location: 753860
Implantable Lead Model: 3830
Implantable Lead Model: 5076
Implantable Pulse Generator Implant Date: 20180410
Lead Channel Impedance Value: 304 Ohm
Lead Channel Impedance Value: 304 Ohm
Lead Channel Impedance Value: 437 Ohm
Lead Channel Impedance Value: 475 Ohm
Lead Channel Pacing Threshold Amplitude: 0.5 V
Lead Channel Pacing Threshold Amplitude: 1.375 V
Lead Channel Pacing Threshold Pulse Width: 0.4 ms
Lead Channel Pacing Threshold Pulse Width: 0.4 ms
Lead Channel Sensing Intrinsic Amplitude: 3.375 mV
Lead Channel Sensing Intrinsic Amplitude: 3.375 mV
Lead Channel Sensing Intrinsic Amplitude: 7.75 mV
Lead Channel Sensing Intrinsic Amplitude: 7.75 mV
Lead Channel Setting Pacing Amplitude: 1.5 V
Lead Channel Setting Pacing Amplitude: 2 V
Lead Channel Setting Pacing Pulse Width: 1 ms
Lead Channel Setting Sensing Sensitivity: 2 mV

## 2020-09-17 ENCOUNTER — Ambulatory Visit: Payer: Medicare Other

## 2020-09-21 ENCOUNTER — Telehealth: Payer: Self-pay | Admitting: Internal Medicine

## 2020-09-21 NOTE — Telephone Encounter (Signed)
Spoke with pt, he reports feeling fine now. But yesterday he was awakened by fast heart rate.  States his body was trembling from it.  It only lasted about a half hour, afterwards he was very weak.  He stayed in bed for rest of day.  Today he feels fine, denies any cardiac symptoms.    Attempted to assist pt with sending a manual transmission, his handheld battery needs to be charged.  He will try again in an hour.  Aldo provided tech support number in case he is not able to get device to work.

## 2020-09-21 NOTE — Telephone Encounter (Signed)
Patients states that he wasn't in no pain on yesterday but his heart was racing so fast that he never experience. Just want to talk to the nurse about it to see why that would happen. He is not experiencing it now. Please advise

## 2020-09-21 NOTE — Telephone Encounter (Signed)
Spoke with patient monitor not working still and I have called tech support and ordered him a new one and it will be here 7-10 business days. Patient will call when it comes in

## 2020-09-30 ENCOUNTER — Ambulatory Visit: Payer: Medicare Other | Admitting: *Deleted

## 2020-09-30 ENCOUNTER — Other Ambulatory Visit: Payer: Self-pay

## 2020-09-30 DIAGNOSIS — I480 Paroxysmal atrial fibrillation: Secondary | ICD-10-CM

## 2020-09-30 DIAGNOSIS — Z5181 Encounter for therapeutic drug level monitoring: Secondary | ICD-10-CM

## 2020-09-30 LAB — POCT INR: INR: 2.6 (ref 2.0–3.0)

## 2020-09-30 NOTE — Patient Instructions (Signed)
Description   Continue taking warfarin 1/2 tablet daily except for 1 tablet on Mondays. Recheck INR in 5 weeks. Call Coumadin clinic with new medications #[phone]

## 2020-10-09 ENCOUNTER — Other Ambulatory Visit: Payer: Self-pay

## 2020-10-09 ENCOUNTER — Ambulatory Visit (INDEPENDENT_AMBULATORY_CARE_PROVIDER_SITE_OTHER): Payer: Medicare Other | Admitting: Podiatry

## 2020-10-09 ENCOUNTER — Encounter: Payer: Self-pay | Admitting: Podiatry

## 2020-10-09 DIAGNOSIS — Q828 Other specified congenital malformations of skin: Secondary | ICD-10-CM

## 2020-10-09 DIAGNOSIS — M79675 Pain in left toe(s): Secondary | ICD-10-CM | POA: Diagnosis not present

## 2020-10-09 DIAGNOSIS — D689 Coagulation defect, unspecified: Secondary | ICD-10-CM | POA: Diagnosis not present

## 2020-10-09 DIAGNOSIS — B351 Tinea unguium: Secondary | ICD-10-CM

## 2020-10-09 DIAGNOSIS — M79674 Pain in right toe(s): Secondary | ICD-10-CM | POA: Diagnosis not present

## 2020-10-09 NOTE — Progress Notes (Signed)
Remote pacemaker transmission.   

## 2020-10-11 NOTE — Progress Notes (Signed)
  Subjective:  Patient ID: Patrick Kelly, male    DOB: 1951-02-24,  MRN: [id number]  Patrick Kelly presents to clinic today for at risk foot care with h/o coagulopathy and painful porokeratotic lesion(s) right heel and painful mycotic toenails that limit ambulation. Painful toenails interfere with ambulation. Aggravating factors include wearing enclosed shoe gear. Pain is relieved with periodic professional debridement. Painful porokeratotic lesions are aggravated when weightbearing with and without shoegear. Pain is relieved with periodic professional debridement.  He voices no new pedal concerns on today's visit.  PCP is Hoy Register, MD , and last visit was a year ago.  No Known Allergies  Review of Systems: Negative except as noted in the HPI. Objective:   Constitutional Patrick Kelly is a pleasant 70 y.o. Caucasian male, in NAD. AAO x 3.   Vascular Capillary fill time to digits <3 seconds b/l lower extremities. Palpable DP pulse(s) b/l lower extremities Palpable PT pulse(s) b/l lower extremities Pedal hair sparse. Lower extremity skin temperature gradient within normal limits. No pain with calf compression b/l. No cyanosis or clubbing noted.  Neurologic Normal speech. Oriented to person, place, and time. Protective sensation intact 5/5 intact bilaterally with 10g monofilament b/l.  Dermatologic Pedal skin with normal turgor, texture and tone b/l lower extremities. No open wounds b/l lower extremities. No interdigital macerations b/l lower extremities. Toenails 1-5 b/l elongated, discolored, dystrophic, thickened, crumbly with subungual debris and tenderness to dorsal palpation. Porokeratotic lesion(s) plantar aspect of heel right foot. No erythema, no edema, no drainage, no fluctuance.  Orthopedic: Normal muscle strength 5/5 to all lower extremity muscle groups bilaterally. No pain crepitus or joint limitation noted with ROM b/l lower extremities. No gross bony deformities b/l lower  extremities.   Radiographs: None Assessment:   1. Onychomycosis   2. Porokeratosis   3. Coagulation defect (HCC)    Plan:  -Examined patient. -Patient to continue soft, supportive shoe gear daily. -Toenails 1-5 b/l were debrided in length and girth with sterile nail nippers and dremel without iatrogenic bleeding.  -Painful porokeratotic lesion(s) plantar aspect of heel right foot pared and enucleated with sterile scalpel blade without incident. Total number of lesions debrided=1. -Patient to report any pedal injuries to medical professional immediately. -Patient/POA to call should there be question/concern in the interim.  Return in about 3 months (around 01/09/2021).  Freddie Breech, DPM

## 2020-10-15 DIAGNOSIS — Z20822 Contact with and (suspected) exposure to covid-19: Secondary | ICD-10-CM | POA: Diagnosis not present

## 2020-11-04 ENCOUNTER — Ambulatory Visit: Payer: Medicare Other

## 2020-11-04 ENCOUNTER — Other Ambulatory Visit: Payer: Self-pay

## 2020-11-04 DIAGNOSIS — I480 Paroxysmal atrial fibrillation: Secondary | ICD-10-CM

## 2020-11-04 DIAGNOSIS — Z5181 Encounter for therapeutic drug level monitoring: Secondary | ICD-10-CM

## 2020-11-04 LAB — POCT INR: INR: 2.2 (ref 2.0–3.0)

## 2020-11-04 NOTE — Patient Instructions (Signed)
Description   Continue taking warfarin 1/2 tablet daily except for 1 tablet on Mondays. Recheck INR in 6 weeks. Call Coumadin clinic with new medications #[phone]      

## 2020-12-16 ENCOUNTER — Ambulatory Visit (INDEPENDENT_AMBULATORY_CARE_PROVIDER_SITE_OTHER): Payer: Medicare Other

## 2020-12-16 DIAGNOSIS — I480 Paroxysmal atrial fibrillation: Secondary | ICD-10-CM | POA: Diagnosis not present

## 2020-12-18 ENCOUNTER — Other Ambulatory Visit: Payer: Self-pay

## 2020-12-18 ENCOUNTER — Ambulatory Visit: Payer: Medicare Other | Admitting: *Deleted

## 2020-12-18 DIAGNOSIS — Z5181 Encounter for therapeutic drug level monitoring: Secondary | ICD-10-CM | POA: Diagnosis not present

## 2020-12-18 DIAGNOSIS — I480 Paroxysmal atrial fibrillation: Secondary | ICD-10-CM | POA: Diagnosis not present

## 2020-12-18 LAB — CUP PACEART REMOTE DEVICE CHECK
Battery Remaining Longevity: 129 mo
Battery Voltage: 3.01 V
Brady Statistic AP VP Percent: 0.02 %
Brady Statistic AP VS Percent: 36.43 %
Brady Statistic AS VP Percent: 0.02 %
Brady Statistic AS VS Percent: 63.53 %
Brady Statistic RA Percent Paced: 36.51 %
Brady Statistic RV Percent Paced: 0.04 %
Date Time Interrogation Session: 20221012010650
Implantable Lead Implant Date: 20180410
Implantable Lead Implant Date: 20180410
Implantable Lead Location: 753859
Implantable Lead Location: 753860
Implantable Lead Model: 3830
Implantable Lead Model: 5076
Implantable Pulse Generator Implant Date: 20180410
Lead Channel Impedance Value: 304 Ohm
Lead Channel Impedance Value: 323 Ohm
Lead Channel Impedance Value: 437 Ohm
Lead Channel Impedance Value: 475 Ohm
Lead Channel Pacing Threshold Amplitude: 0.5 V
Lead Channel Pacing Threshold Amplitude: 1.25 V
Lead Channel Pacing Threshold Pulse Width: 0.4 ms
Lead Channel Pacing Threshold Pulse Width: 0.4 ms
Lead Channel Sensing Intrinsic Amplitude: 2.875 mV
Lead Channel Sensing Intrinsic Amplitude: 2.875 mV
Lead Channel Sensing Intrinsic Amplitude: 8.75 mV
Lead Channel Sensing Intrinsic Amplitude: 8.75 mV
Lead Channel Setting Pacing Amplitude: 1.5 V
Lead Channel Setting Pacing Amplitude: 2 V
Lead Channel Setting Pacing Pulse Width: 1 ms
Lead Channel Setting Sensing Sensitivity: 2 mV

## 2020-12-18 LAB — POCT INR: INR: 2.9 (ref 2.0–3.0)

## 2020-12-18 NOTE — Patient Instructions (Signed)
Description   Continue taking warfarin 1/2 tablet daily except for 1 tablet on Mondays. Recheck INR in 6 weeks. Call Coumadin clinic with new medications #[phone]      

## 2020-12-24 NOTE — Progress Notes (Signed)
Remote pacemaker transmission.   

## 2021-01-11 ENCOUNTER — Ambulatory Visit: Payer: Medicare Other | Admitting: Podiatry

## 2021-01-21 ENCOUNTER — Telehealth: Payer: Self-pay | Admitting: *Deleted

## 2021-01-21 NOTE — Telephone Encounter (Signed)
Patient is calling to cancel his upcoming appointment on 01/22/21, please return his call back to reschedule.

## 2021-01-22 ENCOUNTER — Ambulatory Visit: Payer: Medicare Other | Admitting: Podiatry

## 2021-02-10 ENCOUNTER — Other Ambulatory Visit: Payer: Self-pay

## 2021-02-10 ENCOUNTER — Ambulatory Visit: Payer: Medicare Other | Admitting: *Deleted

## 2021-02-10 DIAGNOSIS — Z5181 Encounter for therapeutic drug level monitoring: Secondary | ICD-10-CM

## 2021-02-10 DIAGNOSIS — I480 Paroxysmal atrial fibrillation: Secondary | ICD-10-CM | POA: Diagnosis not present

## 2021-02-10 LAB — POCT INR: INR: 2.1 (ref 2.0–3.0)

## 2021-02-10 NOTE — Patient Instructions (Signed)
Description   Continue taking warfarin 1/2 tablet daily except for 1 tablet on Mondays. Recheck INR in 6 weeks. Call Coumadin clinic with new medications #[phone]      

## 2021-03-17 ENCOUNTER — Ambulatory Visit (INDEPENDENT_AMBULATORY_CARE_PROVIDER_SITE_OTHER): Payer: Medicare Other

## 2021-03-17 DIAGNOSIS — I495 Sick sinus syndrome: Secondary | ICD-10-CM | POA: Diagnosis not present

## 2021-03-17 LAB — CUP PACEART REMOTE DEVICE CHECK
Battery Remaining Longevity: 126 mo
Battery Voltage: 3.01 V
Brady Statistic AP VP Percent: 0.01 %
Brady Statistic AP VS Percent: 26.28 %
Brady Statistic AS VP Percent: 0.03 %
Brady Statistic AS VS Percent: 73.68 %
Brady Statistic RA Percent Paced: 26.18 %
Brady Statistic RV Percent Paced: 0.1 %
Date Time Interrogation Session: 20230111000540
Implantable Lead Implant Date: 20180410
Implantable Lead Implant Date: 20180410
Implantable Lead Location: 753859
Implantable Lead Location: 753860
Implantable Lead Model: 3830
Implantable Lead Model: 5076
Implantable Pulse Generator Implant Date: 20180410
Lead Channel Impedance Value: 323 Ohm
Lead Channel Impedance Value: 323 Ohm
Lead Channel Impedance Value: 437 Ohm
Lead Channel Impedance Value: 494 Ohm
Lead Channel Pacing Threshold Amplitude: 0.5 V
Lead Channel Pacing Threshold Amplitude: 1.125 V
Lead Channel Pacing Threshold Pulse Width: 0.4 ms
Lead Channel Pacing Threshold Pulse Width: 0.4 ms
Lead Channel Sensing Intrinsic Amplitude: 3.375 mV
Lead Channel Sensing Intrinsic Amplitude: 3.375 mV
Lead Channel Sensing Intrinsic Amplitude: 8.125 mV
Lead Channel Sensing Intrinsic Amplitude: 8.125 mV
Lead Channel Setting Pacing Amplitude: 1.5 V
Lead Channel Setting Pacing Amplitude: 2 V
Lead Channel Setting Pacing Pulse Width: 1 ms
Lead Channel Setting Sensing Sensitivity: 2 mV

## 2021-03-24 ENCOUNTER — Other Ambulatory Visit: Payer: Self-pay

## 2021-03-24 ENCOUNTER — Ambulatory Visit: Payer: Medicare Other | Admitting: *Deleted

## 2021-03-24 DIAGNOSIS — Z5181 Encounter for therapeutic drug level monitoring: Secondary | ICD-10-CM | POA: Diagnosis not present

## 2021-03-24 DIAGNOSIS — I480 Paroxysmal atrial fibrillation: Secondary | ICD-10-CM

## 2021-03-24 LAB — POCT INR: INR: 3.7 — AB (ref 2.0–3.0)

## 2021-03-24 NOTE — Patient Instructions (Signed)
Description   Do not take any Warfarin tomorrow then continue taking warfarin 1/2 tablet daily except for 1 tablet on Mondays. Recheck INR in 4 weeks. Call Coumadin clinic with new medications #[phone]

## 2021-03-26 ENCOUNTER — Other Ambulatory Visit: Payer: Self-pay | Admitting: *Deleted

## 2021-03-26 DIAGNOSIS — I480 Paroxysmal atrial fibrillation: Secondary | ICD-10-CM

## 2021-03-26 MED ORDER — WARFARIN SODIUM 5 MG PO TABS: ORAL_TABLET | ORAL | 1 refills | Status: DC

## 2021-03-26 NOTE — Progress Notes (Signed)
Remote pacemaker transmission.   

## 2021-04-21 ENCOUNTER — Ambulatory Visit: Payer: Medicare Other | Admitting: *Deleted

## 2021-04-21 ENCOUNTER — Other Ambulatory Visit: Payer: Self-pay

## 2021-04-21 DIAGNOSIS — I480 Paroxysmal atrial fibrillation: Secondary | ICD-10-CM | POA: Diagnosis not present

## 2021-04-21 DIAGNOSIS — Z5181 Encounter for therapeutic drug level monitoring: Secondary | ICD-10-CM | POA: Diagnosis not present

## 2021-04-21 LAB — POCT INR: INR: 2.5 (ref 2.0–3.0)

## 2021-04-21 NOTE — Patient Instructions (Signed)
Description   Continue taking warfarin 1/2 tablet daily except for 1 tablet on Mondays. Recheck INR in 5 weeks. Call Coumadin clinic with new medications #[phone]     

## 2021-04-28 IMAGING — CR DG LUMBAR SPINE COMPLETE 4+V
5 series · 5 of 5 positions shown · non-contrast
Comparison: CT chest and chest x-ray 06/18/2018. CT abdomen
06/09/2016.

CLINICAL DATA: Back pain.

EXAM:
LUMBAR SPINE - COMPLETE 4+ VIEW

[l-spine ap]
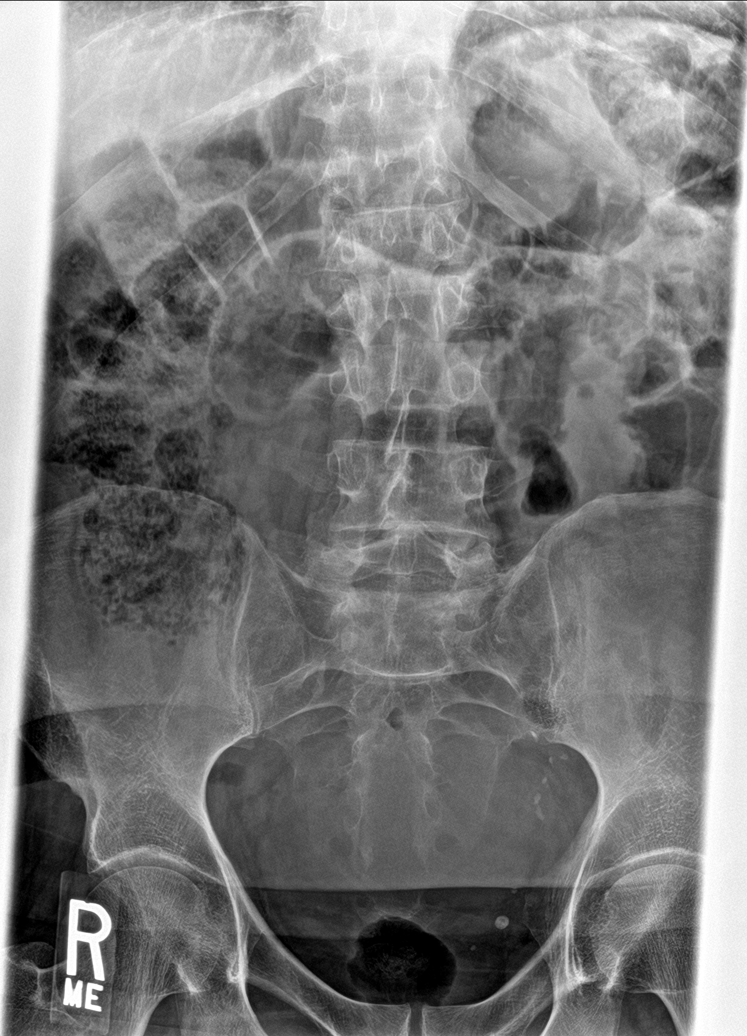

[l-spine obl (1 of 2)]
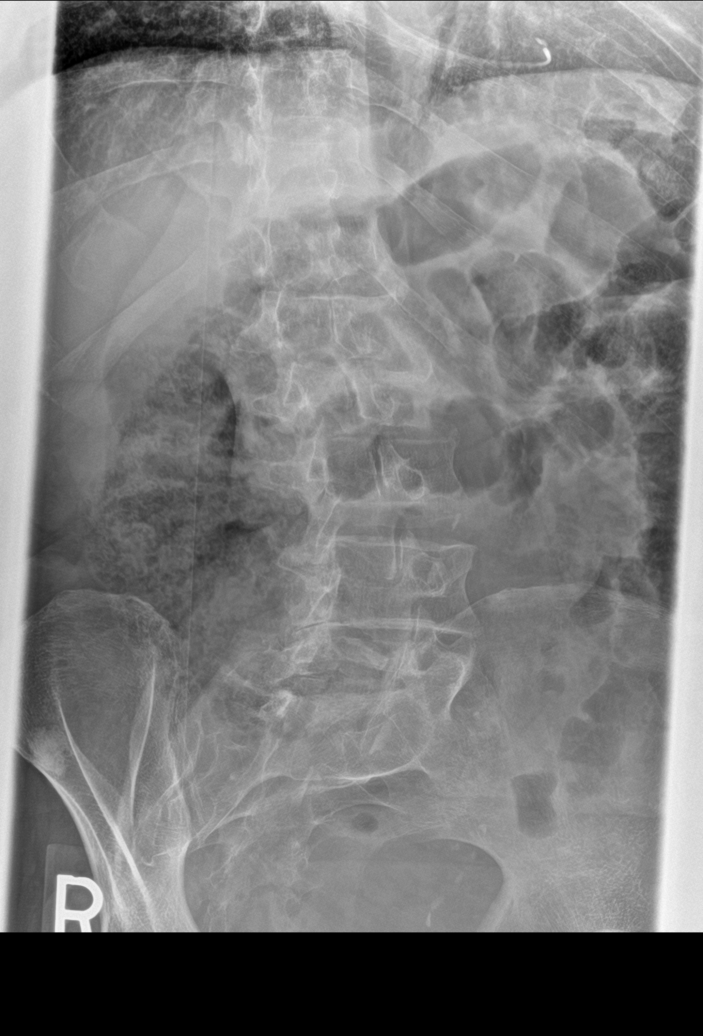

[l-spine obl (2 of 2)]
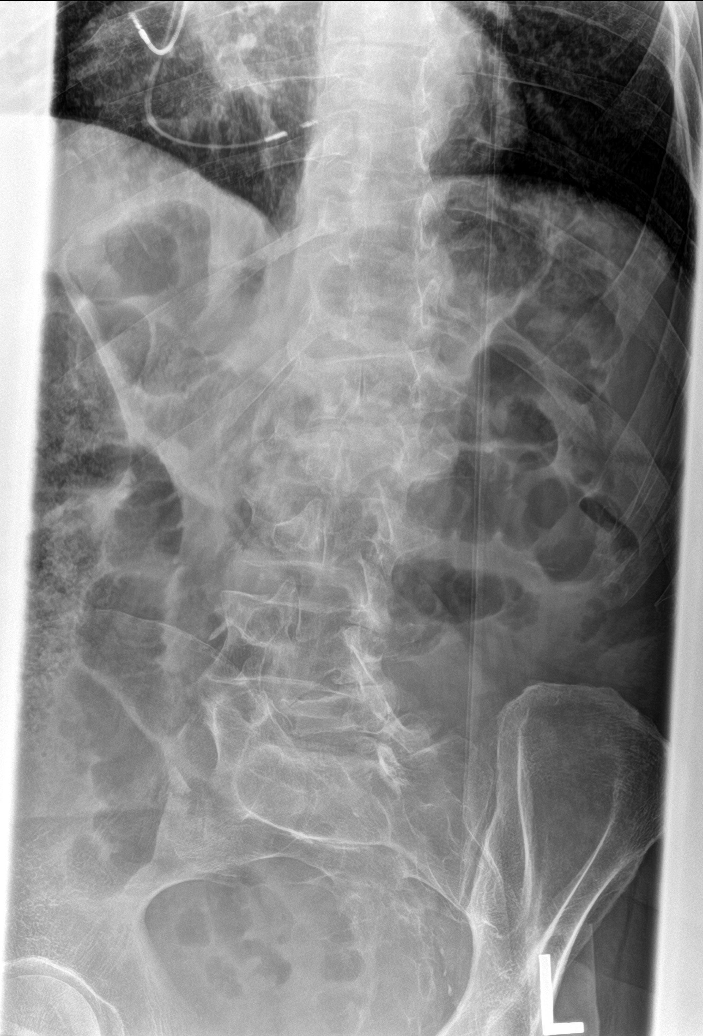

[l-spine lat]
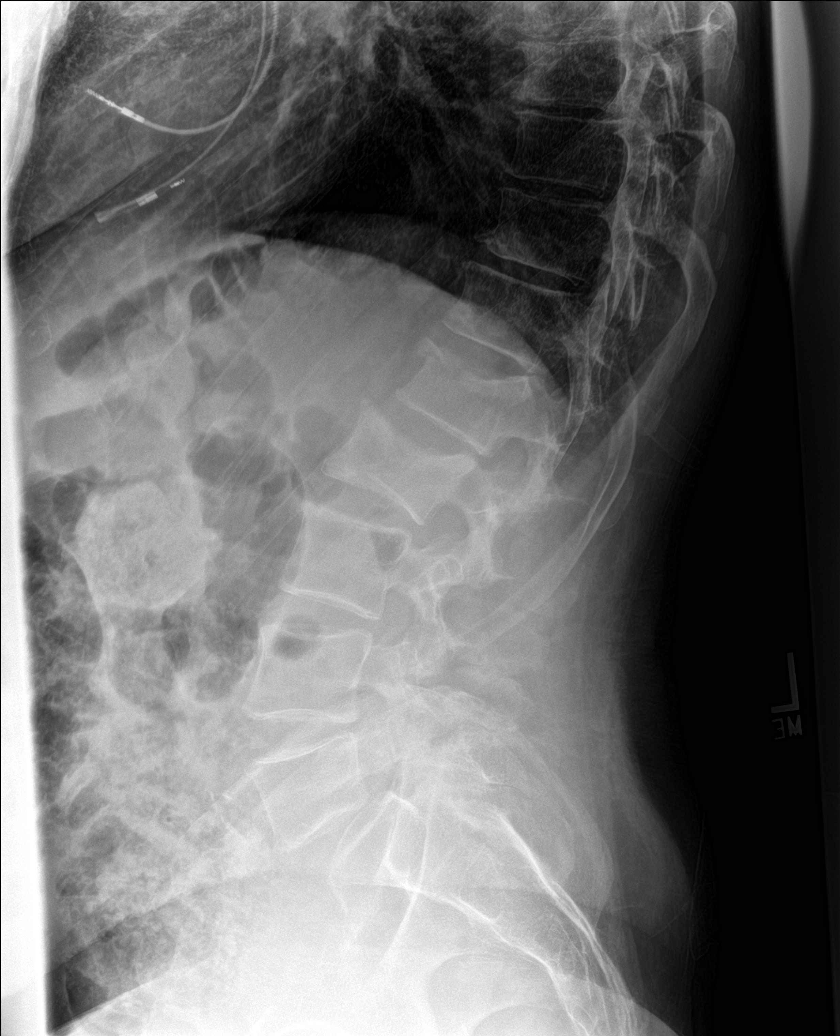

[l-spine spot]
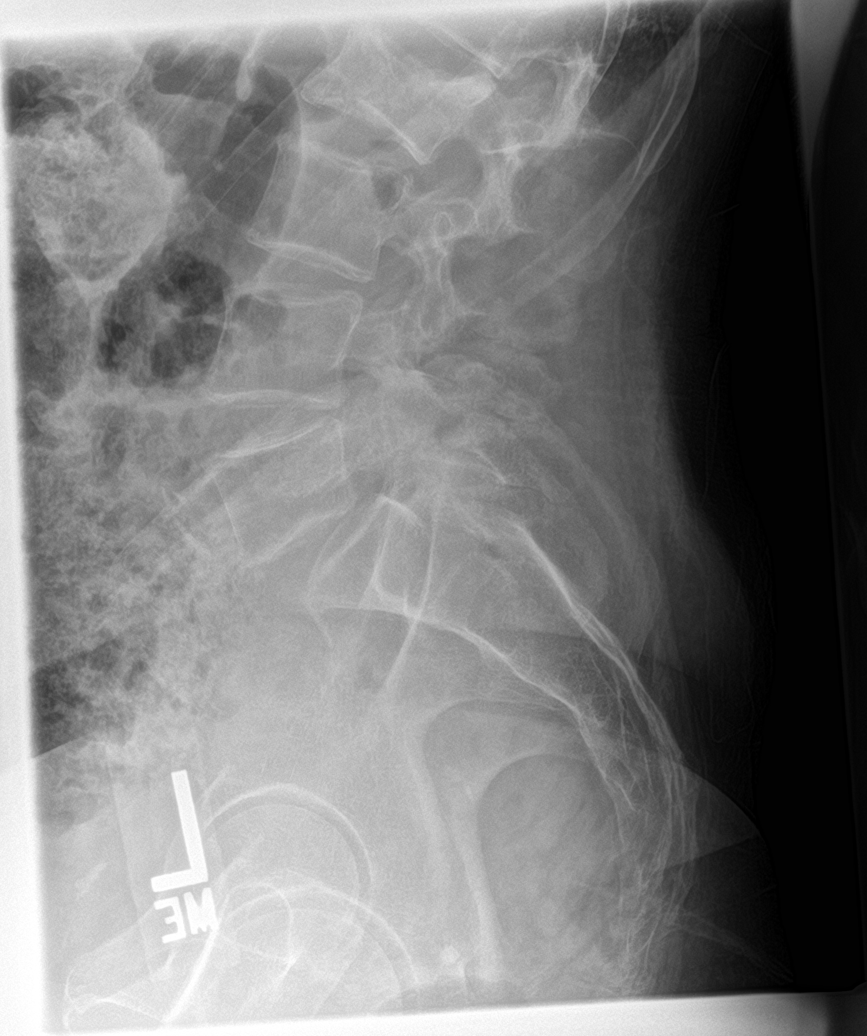

[5 of 5 positions shown; findings below may reference images not displayed]

FINDINGS: Lumbar spine numbered the lowest segmented appearing lumbar shaped
vertebrae on lateral view as L5. L1 and L2 moderate compression
fractures noted. Stable grade 1 anterolisthesis L5 on S1. These
appear new from prior CT of 06/09/2016. Vascular calcifications in
the pelvis.
IMPRESSION: 1. L1 and L2 moderate compression fractures. These appear new from
prior CT of 06/09/2016.

[DATE].  Stable grade 1 anterolisthesis L5 on S1.

## 2021-05-26 ENCOUNTER — Other Ambulatory Visit: Payer: Self-pay

## 2021-05-26 ENCOUNTER — Ambulatory Visit: Payer: Medicare Other | Admitting: *Deleted

## 2021-05-26 DIAGNOSIS — Z5181 Encounter for therapeutic drug level monitoring: Secondary | ICD-10-CM

## 2021-05-26 DIAGNOSIS — I480 Paroxysmal atrial fibrillation: Secondary | ICD-10-CM

## 2021-05-26 LAB — POCT INR: INR: 3 (ref 2.0–3.0)

## 2021-05-26 NOTE — Patient Instructions (Signed)
Description   Continue taking warfarin 1/2 tablet daily except for 1 tablet on Mondays. Recheck INR in 6 weeks. Call Coumadin clinic with new medications #[phone]      

## 2021-06-16 ENCOUNTER — Ambulatory Visit (INDEPENDENT_AMBULATORY_CARE_PROVIDER_SITE_OTHER): Payer: Medicare Other

## 2021-06-16 DIAGNOSIS — I495 Sick sinus syndrome: Secondary | ICD-10-CM | POA: Diagnosis not present

## 2021-06-17 LAB — CUP PACEART REMOTE DEVICE CHECK
Battery Remaining Longevity: 124 mo
Battery Voltage: 3 V
Brady Statistic AP VP Percent: 0.03 %
Brady Statistic AP VS Percent: 34.68 %
Brady Statistic AS VP Percent: 0.03 %
Brady Statistic AS VS Percent: 65.27 %
Brady Statistic RA Percent Paced: 34.3 %
Brady Statistic RV Percent Paced: 0.22 %
Date Time Interrogation Session: 20230412090258
Implantable Lead Implant Date: 20180410
Implantable Lead Implant Date: 20180410
Implantable Lead Location: 753859
Implantable Lead Location: 753860
Implantable Lead Model: 3830
Implantable Lead Model: 5076
Implantable Pulse Generator Implant Date: 20180410
Lead Channel Impedance Value: 323 Ohm
Lead Channel Impedance Value: 323 Ohm
Lead Channel Impedance Value: 475 Ohm
Lead Channel Impedance Value: 494 Ohm
Lead Channel Pacing Threshold Amplitude: 0.625 V
Lead Channel Pacing Threshold Amplitude: 1.25 V
Lead Channel Pacing Threshold Pulse Width: 0.4 ms
Lead Channel Pacing Threshold Pulse Width: 0.4 ms
Lead Channel Sensing Intrinsic Amplitude: 3.5 mV
Lead Channel Sensing Intrinsic Amplitude: 3.5 mV
Lead Channel Sensing Intrinsic Amplitude: 9.75 mV
Lead Channel Sensing Intrinsic Amplitude: 9.75 mV
Lead Channel Setting Pacing Amplitude: 1.5 V
Lead Channel Setting Pacing Amplitude: 2 V
Lead Channel Setting Pacing Pulse Width: 1 ms
Lead Channel Setting Sensing Sensitivity: 2 mV

## 2021-06-28 DIAGNOSIS — Z20822 Contact with and (suspected) exposure to covid-19: Secondary | ICD-10-CM | POA: Diagnosis not present

## 2021-07-02 NOTE — Progress Notes (Signed)
Remote pacemaker transmission.   

## 2021-07-07 ENCOUNTER — Ambulatory Visit (INDEPENDENT_AMBULATORY_CARE_PROVIDER_SITE_OTHER): Payer: Medicare Other | Admitting: *Deleted

## 2021-07-07 DIAGNOSIS — Z5181 Encounter for therapeutic drug level monitoring: Secondary | ICD-10-CM | POA: Diagnosis not present

## 2021-07-07 DIAGNOSIS — I480 Paroxysmal atrial fibrillation: Secondary | ICD-10-CM

## 2021-07-07 LAB — POCT INR: INR: 2.9 (ref 2.0–3.0)

## 2021-07-07 NOTE — Patient Instructions (Signed)
Description   Continue taking warfarin 1/2 tablet daily except for 1 tablet on Mondays. Recheck INR in 6 weeks. Call Coumadin clinic with new medications #[phone]      

## 2021-07-22 ENCOUNTER — Ambulatory Visit (INDEPENDENT_AMBULATORY_CARE_PROVIDER_SITE_OTHER): Payer: Medicare Other | Admitting: *Deleted

## 2021-07-22 DIAGNOSIS — Z0000 Encounter for general adult medical examination without abnormal findings: Secondary | ICD-10-CM | POA: Diagnosis not present

## 2021-07-22 NOTE — Progress Notes (Addendum)
Subjective:   Patrick Kelly is a 71 y.o. male who presents for an Initial Medicare Annual Wellness Visit.  I discussed the limitations of evaluation and management by telemedicine and the availability of in person appointments. Patient expressed understanding and agreed to proceed.   Visit performed using audio  Patient:home Provider:home   Review of Systems    Defer to provider  Cardiac Risk Factors include: advanced age (>19men, >84 women);male gender     Objective:    There were no vitals filed for this visit. There is no height or weight on file to calculate BMI.     07/23/2021   10:47 AM 08/28/2019    1:43 PM 10/10/2018    6:00 AM 03/04/2018   10:35 AM 11/01/2017   10:02 PM 10/18/2016    1:58 PM 07/12/2016    3:09 PM  Advanced Directives  Does Patient Have a Medical Advance Directive? No No Unable to assess, patient is non-responsive or altered mental status No No No No  Would patient like information on creating a medical advance directive? No - Patient declined    No - Patient declined      Current Medications (verified) Outpatient Encounter Medications as of 07/22/2021  Medication Sig   levETIRAcetam (KEPPRA) 500 MG tablet Take 1 tablet (500 mg total) by mouth 2 (two) times daily.   metoprolol tartrate (LOPRESSOR) 25 MG tablet Take 1 tablet (25 mg total) by mouth 2 (two) times daily.   vitamin B-12 (CYANOCOBALAMIN) 1000 MCG tablet Take 1 tablet (1,000 mcg total) by mouth daily.   warfarin (COUMADIN) 5 MG tablet TAKE 1/2 TO 1 TABLET BY MOUTH DAILY AS DIRECTED BY COUMADIN CLINIC   No facility-administered encounter medications on file as of 07/22/2021.    Allergies (verified) Patient has no known allergies.   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   Seizure (HCC) 03/05/2018   Sinusitis    Syncope    Tobacco use    Past Surgical History:  Procedure Laterality Date   PACEMAKER IMPLANT N/A 06/14/2016   Procedure: Pacemaker Implant-Dual chamber;  Surgeon: [name], MD;  Location: [facility];  Service: Cardiovascular;  Laterality: N/A;   Family History  Problem Relation Age of Onset   Hypertension Sister    Healthy Mother    Seizures Neg Hx    Social History   Socioeconomic History   Marital status: Single    Spouse name: Not on file   Number of children: Not on file   Years of education: 16   Highest education level: Bachelor's degree (e.g., BA, AB, BS)  Occupational History   Occupation: Chemist  Tobacco Use   Smoking status: Every Day    Packs/day: 1.00    Types: Cigarettes   Smokeless tobacco: Never   Tobacco comments:    Does not inhale cigarette smoke  Vaping Use   Vaping Use: Never used  Substance and Sexual Activity   Alcohol use: No   Drug  use: Not Currently   Sexual activity: Not on file  Other Topics Concern   Not on file  Social History Narrative   Live Apt alone.  Working as a Charity fundraiser.  Barista.      Social Determinants of Health   Financial Resource Strain: Low Risk    Difficulty of Paying Living Expenses: Not hard at all  Food Insecurity: No Food Insecurity   Worried About Programme researcher, broadcasting/film/video in the Last Year: Never true   Ran Out of Food in the Last Year: Never true  Transportation Needs: No Transportation Needs   Lack of Transportation (Medical): No   Lack of Transportation (Non-Medical): No  Physical Activity: Sufficiently Active   Days of Exercise per Week: 4 days   Minutes of Exercise per Session: 40 min  Stress: No  Stress Concern Present   Feeling of Stress : Not at all  Social Connections: Socially Isolated   Frequency of Communication with Friends and Family: More than three times a week   Frequency of Social Gatherings with Friends and Family: More than three times a week   Attends Religious Services: Never   Database administrator or Organizations: No   Attends Engineer, structural: Never   Marital Status: Never married    Tobacco Counseling Ready to quit: Not Answered Counseling given: Not Answered Tobacco comments: Does not inhale cigarette smoke   Clinical Intake:  Pre-visit preparation completed: Yes  Pain : No/denies pain     Nutritional Risks: None Diabetes: No  How often do you need to have someone help you when you read instructions, pamphlets, or other written materials from your doctor or pharmacy?: 1 - Never What is the last grade level you completed in school?: 12  Diabetic?no  Interpreter Needed?: No  Information entered by :: Melody Comas, CMA   Activities of Daily Living    07/23/2021   10:48 AM  In your present state of health, do you have any difficulty performing the following activities:  Hearing? 0  Vision? 0  Difficulty concentrating or making decisions? 0  Walking or climbing stairs? 0  Dressing or bathing? 0  Doing errands, shopping? 0  Preparing Food and eating ? N  Using the Toilet? N  In the past six months, have you accidently leaked urine? N  Do you have problems with loss of bowel control? N  Managing your Medications? N  Managing your Finances? N  Housekeeping or managing your Housekeeping? N    Patient Care Team: Hoy Register, MD as PCP - General (Family Medicine) [name], MD as PCP - Electrophysiology (Cardiology)  Indicate any recent Medical Services you may have received from other than [facility] providers in the past year (date may be approximate).     Assessment:   This is a routine wellness examination  for Patrick Kelly.  Hearing/Vision screen No results found.  Dietary issues and exercise activities discussed: Current Exercise Habits: Home exercise routine, Type of exercise: walking, Time (Minutes): 40, Frequency (Times/Week): 4, Weekly Exercise (Minutes/Week): 160, Intensity: Mild, Exercise limited by: None identified   Goals Addressed   None    Depression Screen    07/23/2021   10:48 AM 07/23/2021   10:45 AM 11/14/2019    3:27 PM 08/28/2019    1:44 PM 12/26/2018    2:07 PM 10/18/2016    1:59 PM 07/12/2016    3:29 PM  PHQ 2/9 Scores  PHQ - 2 Score 0 0 0 0 0  0 0  PHQ- 9 Score   1 0 0 0 0    Fall Risk    07/23/2021   10:48 AM 07/22/2021    4:15 PM 08/28/2019    1:44 PM 01/02/2019   10:05 AM 12/26/2018    2:05 PM  Fall Risk   Falls in the past year? 0 0 0 0 0  Number falls in past yr: 0 0     Injury with Fall? 0 0     Risk for fall due to : No Fall Risks  No Fall Risks    Follow up Falls evaluation completed        FALL RISK PREVENTION PERTAINING TO THE HOME:  Any stairs in or around the home? No  If so, are there any without handrails? No  Home free of loose throw rugs in walkways, pet beds, electrical cords, etc? Yes  Adequate lighting in your home to reduce risk of falls? Yes   ASSISTIVE DEVICES UTILIZED TO PREVENT FALLS:  Life alert? No  Use of a cane, walker or w/c? No  Grab bars in the bathroom? No  Shower chair or bench in shower? No  Elevated toilet seat or a handicapped toilet? No   TIMED UP AND GO:  Was the test performed? No .  Length of time to ambulate NA      Cognitive Function:    07/23/2021   10:49 AM 11/07/2018    2:04 PM  MMSE - Mini Mental State Exam  Not completed: Unable to complete   Orientation to time  5  Orientation to Place  4  Registration  3  Attention/ Calculation  5  Recall  3  Language- name 2 objects  2  Language- repeat  1  Language- follow 3 step command  3  Language- read & follow direction  1  Write a sentence  1  Copy  design  1  Total score  29        07/23/2021   10:49 AM  6CIT Screen  What Year? 0 points  What month? 0 points  What time? 0 points  Count back from 20 0 points  Months in reverse 0 points  Repeat phrase 0 points  Total Score 0 points    Immunizations Immunization History  Administered Date(s) Administered   Pneumococcal Polysaccharide-23 11/08/2018   Tdap 06/09/2016    TDAP status: Up to date  Flu Vaccine status: Declined, Education has been provided regarding the importance of this vaccine but patient still declined. Advised may receive this vaccine at local pharmacy or Health Dept. Aware to provide a copy of the vaccination record if obtained from local pharmacy or Health Dept. Verbalized acceptance and understanding.  Pneumococcal vaccine status: Up to date  Covid-19 vaccine status: Declined, Education has been provided regarding the importance of this vaccine but patient still declined. Advised may receive this vaccine at local pharmacy or Health Dept.or vaccine clinic. Aware to provide a copy of the vaccination record if obtained from local pharmacy or Health Dept. Verbalized acceptance and understanding.  Qualifies for Shingles Vaccine? Yes   Zostavax completed No   Shingrix Completed?: No.    Education has been provided regarding the importance of this vaccine. Patient has been advised to call insurance company to determine out of pocket expense if they have not yet received this vaccine. Advised may also receive vaccine at local pharmacy or Health Dept. Verbalized acceptance and understanding.  Screening Tests Health Maintenance  Topic Date  Due   Zoster Vaccines- Shingrix (1 of 2) Never done   COLONOSCOPY (Pts 45-65yrs Insurance coverage will need to be confirmed)  Never done   Pneumonia Vaccine 2+ Years old (2 - PCV) 11/08/2019   COVID-19 Vaccine (1) 08/07/2021 (Originally 01/22/1951)   INFLUENZA VACCINE  10/05/2021   TETANUS/TDAP  06/10/2026   Hepatitis C  Screening  Completed   HPV VACCINES  Aged Out    Health Maintenance  Health Maintenance Due  Topic Date Due   Zoster Vaccines- Shingrix (1 of 2) Never done   COLONOSCOPY (Pts 45-86yrs Insurance coverage will need to be confirmed)  Never done   Pneumonia Vaccine 26+ Years old (2 - PCV) 11/08/2019    Patient declines colonoscopy   Lung Cancer Screening: (Low Dose CT Chest recommended if Age 17-80 years, 30 pack-year currently smoking OR have quit w/in 15years.) does not qualify.   Lung Cancer Screening was don on 11/19/2018  Additional Screening:  Hepatitis C Screening: does not qualify; Completed 11/09/2018  Vision Screening: Recommended annual ophthalmology exams for early detection of glaucoma and other disorders of the eye. Is the patient up to date with their annual eye exam?  No  Who is the provider or what is the name of the office in which the patient attends annual eye exams? Patient does not have provider  If pt is not established with a provider, would they like to be referred to a provider to establish care?  Patient wants to hold off for now .   Dental Screening: Recommended annual dental exams for proper oral hygiene  Community Resource Referral / Chronic Care Management: CRR required this visit?  No   CCM required this visit?  No      Plan:     I have personally reviewed and noted the following in the patient's chart:   Medical and social history Use of alcohol, tobacco or illicit drugs  Current medications and supplements including opioid prescriptions. Patient is not currently taking opioid prescriptions. Functional ability and status Nutritional status Physical activity Advanced directives List of other physicians Hospitalizations, surgeries, and ER visits in previous 12 months Vitals Screenings to include cognitive, depression, and falls Referrals and appointments  In addition, I have reviewed and discussed with patient certain preventive  protocols, quality metrics, and best practice recommendations. A written personalized care plan for preventive services as well as general preventive health recommendations were provided to patient.     Melody Comas, [place]   07/23/2021   Nurse Notes:  Mr. Doolittle , Thank you for taking time to come for your Medicare Wellness Visit. I appreciate your ongoing commitment to your health goals. Please review the following plan we discussed and let me know if I can assist you in the future.   These are the goals we discussed:  Goals   None     This is a list of the screening recommended for you and due dates:  Health Maintenance  Topic Date Due   Zoster (Shingles) Vaccine (1 of 2) Never done   Colon Cancer Screening  Never done   Pneumonia Vaccine (2 - PCV) 11/08/2019   COVID-19 Vaccine (1) 08/07/2021*   Flu Shot  10/05/2021   Tetanus Vaccine  06/10/2026   Hepatitis C Screening: USPSTF Recommendation to screen - Ages 18-79 yo.  Completed   HPV Vaccine  Aged Out  *Topic was postponed. The date shown is not the original due date.     I have reviewed and agreed  with the above documentation.  Kara Dies, NP

## 2021-08-15 ENCOUNTER — Other Ambulatory Visit: Payer: Self-pay | Admitting: Student

## 2021-08-15 DIAGNOSIS — I480 Paroxysmal atrial fibrillation: Secondary | ICD-10-CM

## 2021-08-18 ENCOUNTER — Ambulatory Visit (INDEPENDENT_AMBULATORY_CARE_PROVIDER_SITE_OTHER): Payer: Medicare Other | Admitting: *Deleted

## 2021-08-18 DIAGNOSIS — Z5181 Encounter for therapeutic drug level monitoring: Secondary | ICD-10-CM | POA: Diagnosis not present

## 2021-08-18 DIAGNOSIS — I480 Paroxysmal atrial fibrillation: Secondary | ICD-10-CM | POA: Diagnosis not present

## 2021-08-18 LAB — POCT INR: INR: 2.7 (ref 2.0–3.0)

## 2021-08-18 NOTE — Patient Instructions (Signed)
Description   Continue taking warfarin 1/2 tablet daily except for 1 tablet on Mondays. Recheck INR in 6 weeks. Call Coumadin clinic with new medications #[phone]      

## 2021-08-24 ENCOUNTER — Ambulatory Visit: Payer: Medicare Other | Admitting: Adult Health

## 2021-08-25 ENCOUNTER — Other Ambulatory Visit: Payer: Self-pay | Admitting: Adult Health

## 2021-08-26 ENCOUNTER — Other Ambulatory Visit: Payer: Self-pay | Admitting: Adult Health

## 2021-09-15 ENCOUNTER — Ambulatory Visit (INDEPENDENT_AMBULATORY_CARE_PROVIDER_SITE_OTHER): Payer: Medicare Other

## 2021-09-15 DIAGNOSIS — I495 Sick sinus syndrome: Secondary | ICD-10-CM

## 2021-09-15 LAB — CUP PACEART REMOTE DEVICE CHECK
Battery Remaining Longevity: 121 mo
Battery Voltage: 3 V
Brady Statistic AP VP Percent: 0.02 %
Brady Statistic AP VS Percent: 37.33 %
Brady Statistic AS VP Percent: 0.03 %
Brady Statistic AS VS Percent: 62.62 %
Brady Statistic RA Percent Paced: 37.04 %
Brady Statistic RV Percent Paced: 0.13 %
Date Time Interrogation Session: 20230712010554
Implantable Lead Implant Date: 20180410
Implantable Lead Implant Date: 20180410
Implantable Lead Location: 753859
Implantable Lead Location: 753860
Implantable Lead Model: 3830
Implantable Lead Model: 5076
Implantable Pulse Generator Implant Date: 20180410
Lead Channel Impedance Value: 304 Ohm
Lead Channel Impedance Value: 304 Ohm
Lead Channel Impedance Value: 456 Ohm
Lead Channel Impedance Value: 456 Ohm
Lead Channel Pacing Threshold Amplitude: 0.5 V
Lead Channel Pacing Threshold Amplitude: 1.375 V
Lead Channel Pacing Threshold Pulse Width: 0.4 ms
Lead Channel Pacing Threshold Pulse Width: 0.4 ms
Lead Channel Sensing Intrinsic Amplitude: 3.625 mV
Lead Channel Sensing Intrinsic Amplitude: 3.625 mV
Lead Channel Sensing Intrinsic Amplitude: 8.75 mV
Lead Channel Sensing Intrinsic Amplitude: 8.75 mV
Lead Channel Setting Pacing Amplitude: 1.5 V
Lead Channel Setting Pacing Amplitude: 2 V
Lead Channel Setting Pacing Pulse Width: 1 ms
Lead Channel Setting Sensing Sensitivity: 2 mV

## 2021-09-29 ENCOUNTER — Other Ambulatory Visit: Payer: Self-pay

## 2021-09-29 ENCOUNTER — Ambulatory Visit (INDEPENDENT_AMBULATORY_CARE_PROVIDER_SITE_OTHER): Payer: Medicare Other

## 2021-09-29 DIAGNOSIS — I480 Paroxysmal atrial fibrillation: Secondary | ICD-10-CM

## 2021-09-29 DIAGNOSIS — Z5181 Encounter for therapeutic drug level monitoring: Secondary | ICD-10-CM

## 2021-09-29 LAB — POCT INR: INR: 3.5 — AB (ref 2.0–3.0)

## 2021-09-29 MED ORDER — METOPROLOL TARTRATE 25 MG PO TABS: 25.0000 mg | ORAL_TABLET | Freq: Two times a day (BID) | ORAL | 0 refills | Status: DC

## 2021-09-29 NOTE — Patient Instructions (Signed)
Description   Hold today's dose and then continue taking warfarin 1/2 tablet daily except for 1 tablet on Mondays. Recheck INR in 6 weeks. Call Coumadin clinic with new medications #[phone]

## 2021-09-29 NOTE — Telephone Encounter (Signed)
Pt came to Coumadin Clinic and requested refill for Metoprolol.   Requested 90 day supply be sent to [facility] in [place].

## 2021-10-06 ENCOUNTER — Other Ambulatory Visit: Payer: Self-pay

## 2021-10-06 DIAGNOSIS — I480 Paroxysmal atrial fibrillation: Secondary | ICD-10-CM

## 2021-10-06 MED ORDER — WARFARIN SODIUM 5 MG PO TABS: ORAL_TABLET | ORAL | 1 refills | Status: DC

## 2021-10-06 MED ORDER — WARFARIN SODIUM 5 MG PO TABS
ORAL_TABLET | ORAL | 1 refills | Status: DC
  Filled 2021-10-06: qty 60, fill #0

## 2021-10-06 NOTE — Progress Notes (Signed)
Remote pacemaker transmission.   

## 2021-10-06 NOTE — Telephone Encounter (Signed)
Prescription refill request received for warfarin Lov: 07/26/20 Lanna Poche)  Next INR check: 11/10/21 Warfarin tablet strength: 5mg   Pt overdue for office visit. Placed note on upcoming anticoagulation encounter for pt to schedule appt. Appropriate dose and refill sent to requested pharmacy.

## 2021-10-06 NOTE — Addendum Note (Signed)
Addended by: Memory Dance on: 10/06/2021 11:06 AM   Modules accepted: Orders

## 2021-10-08 ENCOUNTER — Telehealth: Payer: Self-pay | Admitting: Internal Medicine

## 2021-10-08 DIAGNOSIS — I480 Paroxysmal atrial fibrillation: Secondary | ICD-10-CM

## 2021-10-08 MED ORDER — METOPROLOL TARTRATE 25 MG PO TABS: 25.0000 mg | ORAL_TABLET | Freq: Two times a day (BID) | ORAL | 0 refills | Status: DC

## 2021-10-08 NOTE — Telephone Encounter (Signed)
Pt's medication was sent to pt's pharmacy as requested. Confirmation received.  °

## 2021-10-08 NOTE — Telephone Encounter (Signed)
*  STAT* If patient is at the pharmacy, call can be transferred to refill team.   1. Which medications need to be refilled? (please list name of each medication and dose if known) metoprolol tartrate (LOPRESSOR) 25 MG tablet  2. Which pharmacy/location (including street and city if local pharmacy) is medication to be sent to? [id number] - [place], [place] - [address]  3. Do they need a 30 day or 90 day supply? 30  Pt requesting another refill because he was only sent 15 day supply last time. Pt made f/u 10/21/21 with Lanna Poche

## 2021-10-19 NOTE — Progress Notes (Unsigned)
Electrophysiology Office Note Date: 10/21/2021  ID:  Patrick Kelly, DOB [birth year], MRN [id number]  PCP: Hoy Register, MD Primary Cardiologist: None Electrophysiologist: Lewayne Bunting, MD   CC: Pacemaker follow-up  Patrick Kelly is a 71 y.o. male seen today for Lewayne Bunting, MD for routine electrophysiology followup.  Since last being seen in our clinic the patient reports doing well overall.  he denies chest pain, palpitations, dyspnea, PND, orthopnea, nausea, vomiting, dizziness, syncope, edema, weight gain, or early satiety.  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 60 tablet 0   vitamin B-12 (CYANOCOBALAMIN) 1000 MCG tablet Take 1 tablet (1,000 mcg total) by mouth daily. 30 tablet 0   warfarin (COUMADIN) 5 MG tablet TAKE 1/2 TO 1 TABLET BY MOUTH DAILY AS DIRECTED BY COUMADIN CLINIC 60 tablet  1   No current facility-administered medications for this visit.    Allergies:   Patient has no known allergies.   Social History: Social History   Socioeconomic History   Marital status: Single    Spouse name: Not on file   Number of children: Not on file   Years of education: 16   Highest education level: Bachelor's degree (e.g., BA, AB, BS)  Occupational History   Occupation: Charity fundraiser  Tobacco Use   Smoking status: Every Day    Packs/day: 1.00    Types: Cigarettes   Smokeless tobacco: Never   Tobacco comments:    Does not inhale cigarette smoke  Vaping Use   Vaping Use: Never used  Substance and Sexual Activity   Alcohol use: No   Drug use: Not Currently   Sexual activity: Not on file  Other Topics Concern   Not on file  Social History Narrative   Live Apt alone.  Working as a Charity fundraiser.  Barista.      Social Determinants of Health   Financial Resource Strain: Low Risk  (07/23/2021)   Overall Financial Resource Strain (CARDIA)    Difficulty of Paying Living Expenses: Not hard at all  Food Insecurity: No Food Insecurity (07/23/2021)   Hunger Vital Sign    Worried About Running Out of Food in the Last Year: Never true    Ran Out of Food in the Last Year: Never true  Transportation Needs: No Transportation Needs (07/23/2021)   PRAPARE -  Administrator, Civil Service (Medical): No    Lack of Transportation (Non-Medical): No  Physical Activity: Sufficiently Active (07/23/2021)   Exercise Vital Sign    Days of Exercise per Week: 4 days    Minutes of Exercise per Session: 40 min  Stress: No Stress Concern Present (07/23/2021)   [employer] of Occupational Health - Occupational Stress Questionnaire    Feeling of Stress : Not at all  Social Connections: Socially Isolated (07/23/2021)   Social Connection and Isolation Panel [NHANES]    Frequency of Communication with Friends and Family: More than three times a week    Frequency of Social Gatherings  with Friends and Family: More than three times a week    Attends Religious Services: Never    Database administrator or Organizations: No    Attends Banker Meetings: Never    Marital Status: Never married  Intimate Partner Violence: Not At Risk (07/23/2021)   Humiliation, Afraid, Rape, and Kick questionnaire    Fear of Current or Ex-Partner: No    Emotionally Abused: No    Physically Abused: No    Sexually Abused: No    Family History: Family History  Problem Relation Age of Onset   Hypertension Sister    Healthy Mother    Seizures Neg Hx      Review of Systems: All other systems reviewed and are otherwise negative except as noted above.  Physical Exam: Vitals:   10/21/21 1124  BP: 116/80  Pulse: 60  Weight: 176 lb (79.8 kg)  Height: 5\' 10"  (1.778 m)     GEN- The patient is well appearing, alert and oriented x 3 today.   HEENT: normocephalic, atraumatic; sclera clear, conjunctiva pink; hearing intact; oropharynx clear; neck supple  Lungs- Clear to ausculation bilaterally, normal work of breathing.  No wheezes, rales, rhonchi Heart- Regular rate and rhythm, no murmurs, rubs or gallops  GI- soft, non-tender, non-distended, bowel sounds present  Extremities- no clubbing or cyanosis. No edema MS- no significant deformity or atrophy Skin- warm and dry, no rash or lesion; PPM pocket well healed Psych- euthymic mood, full affect Neuro- strength and sensation are intact  PPM Interrogation- reviewed in detail today,  See PACEART report  EKG:  EKG is ordered today. Personal review of ekg ordered today shows AP at 60 bpm   Recent Labs: No results found for requested labs within last 365 days.   Wt Readings from Last 3 Encounters:  10/21/21 176 lb (79.8 kg)  08/24/20 167 lb (75.8 kg)  07/29/20 170 lb 9.6 oz (77.4 kg)     Other studies Reviewed: Additional studies/ records that were reviewed today include: Previous EP office notes, Previous remote checks,  Most recent labwork.   Assessment and Plan:  1. SND s/p Medtronic PPM  Normal PPM function See Pace Art report No changes today  2. PAF with variable rate control EKG today shows AP at 60.  Continue lopressor 25 mg BID. Rates overall controlled when in fib.  Continue coumadin for [id number]  of at least 4.  3. Tobacco abuse Encouraged complete cessation  Current medicines are reviewed at length with the patient today.    Labs/ tests ordered today include:  Orders Placed This Encounter  Procedures   Basic metabolic panel   CBC   EKG 12-Lead    Disposition:   Follow up with Dr. 07/31/20 in 12 months    Signed, [name], PA-C  10/21/2021 11:30 AM  [place] [address] [place] [address] [phone] (office) [phone] (fax)

## 2021-10-21 ENCOUNTER — Encounter: Payer: Self-pay | Admitting: Student

## 2021-10-21 ENCOUNTER — Ambulatory Visit (INDEPENDENT_AMBULATORY_CARE_PROVIDER_SITE_OTHER): Payer: Medicare Other | Admitting: Student

## 2021-10-21 VITALS — BP 116/80 | HR 60 | Ht 70.0 in | Wt 176.0 lb

## 2021-10-21 DIAGNOSIS — Z5181 Encounter for therapeutic drug level monitoring: Secondary | ICD-10-CM

## 2021-10-21 DIAGNOSIS — I480 Paroxysmal atrial fibrillation: Secondary | ICD-10-CM

## 2021-10-21 DIAGNOSIS — I495 Sick sinus syndrome: Secondary | ICD-10-CM

## 2021-10-21 LAB — CUP PACEART INCLINIC DEVICE CHECK
Battery Remaining Longevity: 120 mo
Battery Voltage: 3 V
Brady Statistic AP VP Percent: 0.02 %
Brady Statistic AP VS Percent: 32.73 %
Brady Statistic AS VP Percent: 0.03 %
Brady Statistic AS VS Percent: 67.22 %
Brady Statistic RA Percent Paced: 32.47 %
Brady Statistic RV Percent Paced: 0.16 %
Date Time Interrogation Session: 20230817123351
Implantable Lead Implant Date: 20180410
Implantable Lead Implant Date: 20180410
Implantable Lead Location: 753859
Implantable Lead Location: 753860
Implantable Lead Model: 3830
Implantable Lead Model: 5076
Implantable Pulse Generator Implant Date: 20180410
Lead Channel Impedance Value: 323 Ohm
Lead Channel Impedance Value: 342 Ohm
Lead Channel Impedance Value: 475 Ohm
Lead Channel Impedance Value: 494 Ohm
Lead Channel Pacing Threshold Amplitude: 0.5 V
Lead Channel Pacing Threshold Amplitude: 1.25 V
Lead Channel Pacing Threshold Pulse Width: 0.4 ms
Lead Channel Pacing Threshold Pulse Width: 0.4 ms
Lead Channel Sensing Intrinsic Amplitude: 12.125 mV
Lead Channel Sensing Intrinsic Amplitude: 3.375 mV
Lead Channel Sensing Intrinsic Amplitude: 3.625 mV
Lead Channel Sensing Intrinsic Amplitude: 8.625 mV
Lead Channel Setting Pacing Amplitude: 1.5 V
Lead Channel Setting Pacing Amplitude: 2 V
Lead Channel Setting Pacing Pulse Width: 1 ms
Lead Channel Setting Sensing Sensitivity: 2 mV

## 2021-10-21 MED ORDER — METOPROLOL TARTRATE 25 MG PO TABS: 25.0000 mg | ORAL_TABLET | Freq: Two times a day (BID) | ORAL | 3 refills | Status: DC

## 2021-10-21 NOTE — Patient Instructions (Signed)
Medication Instructions:  ?Your physician recommends that you continue on your current medications as directed. Please refer to the Current Medication list given to you today. ? ?*If you need a refill on your cardiac medications before your next appointment, please call your pharmacy* ? ? ?Lab Work: ?TODAY: BMET, CBC ? ?If you have labs (blood work) drawn today and your tests are completely normal, you will receive your results only by: ?MyChart Message (if you have MyChart) OR ?A paper copy in the mail ?If you have any lab test that is abnormal or we need to change your treatment, we will call you to review the results. ? ?Follow-Up: ?At [facility], you and your health needs are our priority.  As part of our continuing mission to provide you with exceptional heart care, we have created designated Provider Care Teams.  These Care Teams include your primary Cardiologist (physician) and Advanced Practice Providers (APPs -  Physician Assistants and Nurse Practitioners) who all work together to provide you with the care you need, when you need it. ? ?We recommend signing up for the patient portal called "MyChart".  Sign up information is provided on this After Visit Summary.  MyChart is used to connect with patients for Virtual Visits (Telemedicine).  Patients are able to view lab/test results, encounter notes, upcoming appointments, etc.  Non-urgent messages can be sent to your provider as well.   ?To learn more about what you can do with MyChart, go to [email or web address].   ? ?Your next appointment:   ?1 year(s) ? ?The format for your next appointment:   ?In Person ? ?Provider:   ?Gregg Taylor, MD   ?

## 2021-10-22 LAB — CBC
Hematocrit: 43 % (ref 37.5–51.0)
Hemoglobin: 14.7 g/dL (ref 13.0–17.7)
MCH: 33.3 pg — ABNORMAL HIGH (ref 26.6–33.0)
MCHC: 34.2 g/dL (ref 31.5–35.7)
MCV: 97 fL (ref 79–97)
Platelets: 182 10*3/uL (ref 150–450)
RBC: 4.42 x10E6/uL (ref 4.14–5.80)
RDW: 12.4 % (ref 11.6–15.4)
WBC: 5.4 10*3/uL (ref 3.4–10.8)

## 2021-10-22 LAB — BASIC METABOLIC PANEL
BUN/Creatinine Ratio: 18 (ref 10–24)
BUN: 18 mg/dL (ref 8–27)
CO2: 26 mmol/L (ref 20–29)
Calcium: 8.8 mg/dL (ref 8.6–10.2)
Chloride: 103 mmol/L (ref 96–106)
Creatinine, Ser: 1.02 mg/dL (ref 0.76–1.27)
Glucose: 80 mg/dL (ref 70–99)
Potassium: 4.7 mmol/L (ref 3.5–5.2)
Sodium: 139 mmol/L (ref 134–144)
eGFR: 79 mL/min/{1.73_m2} (ref >59)

## 2021-10-26 ENCOUNTER — Encounter: Payer: Self-pay | Admitting: Adult Health

## 2021-10-26 ENCOUNTER — Ambulatory Visit (INDEPENDENT_AMBULATORY_CARE_PROVIDER_SITE_OTHER): Payer: Medicare Other | Admitting: Adult Health

## 2021-10-26 VITALS — BP 122/80 | HR 65 | Wt 177.2 lb

## 2021-10-26 DIAGNOSIS — R569 Unspecified convulsions: Secondary | ICD-10-CM
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
## 2021-10-26 NOTE — Patient Instructions (Addendum)
No changes today, continue keppra 500 mg twice daily for seizure prevention   Follow-up in 1 year or call earlier if needed

## 2021-11-10 ENCOUNTER — Ambulatory Visit: Payer: Medicare Other | Attending: Cardiology

## 2021-11-10 DIAGNOSIS — I480 Paroxysmal atrial fibrillation: Secondary | ICD-10-CM | POA: Insufficient documentation

## 2021-11-10 DIAGNOSIS — Z5181 Encounter for therapeutic drug level monitoring: Secondary | ICD-10-CM | POA: Diagnosis not present

## 2021-11-10 LAB — POCT INR: INR: 3 (ref 2.0–3.0)

## 2021-11-10 NOTE — Patient Instructions (Signed)
Description   Continue taking warfarin 1/2 tablet daily except for 1 tablet on Mondays. Recheck INR in 6 weeks. Call Coumadin clinic with new medications #[phone]

## 2021-12-01 ENCOUNTER — Other Ambulatory Visit: Payer: Self-pay | Admitting: Internal Medicine

## 2021-12-01 DIAGNOSIS — I480 Paroxysmal atrial fibrillation: Secondary | ICD-10-CM

## 2021-12-01 NOTE — Telephone Encounter (Signed)
Prescription refill request received for warfarin Lov: 10/21/21 Patrick Kelly) Next INR check: 12/22/21 Warfarin tablet strength: 5mg   Appropriate dose and refill sent to requested pharmacy.

## 2021-12-09 DIAGNOSIS — Z0289 Encounter for other administrative examinations: Secondary | ICD-10-CM

## 2021-12-13 ENCOUNTER — Telehealth: Payer: Self-pay | Admitting: *Deleted

## 2021-12-13 NOTE — Telephone Encounter (Signed)
Signed and placed in outbox.  Thank you. ?

## 2021-12-13 NOTE — Telephone Encounter (Signed)
[place] DMV form completed, signed and sent to Medical records for processing.

## 2021-12-13 NOTE — Telephone Encounter (Signed)
Received [place] DMV form,filled out,  placed on NP's desk for review, signatures.

## 2021-12-14 ENCOUNTER — Telehealth: Payer: Self-pay | Admitting: Adult Health

## 2021-12-14 NOTE — Telephone Encounter (Signed)
[place] DMV form faxed to [facility] (fax # [phone]) on 12/14/2021

## 2021-12-15 ENCOUNTER — Ambulatory Visit (INDEPENDENT_AMBULATORY_CARE_PROVIDER_SITE_OTHER): Payer: Medicare Other

## 2021-12-15 DIAGNOSIS — I495 Sick sinus syndrome: Secondary | ICD-10-CM | POA: Diagnosis not present

## 2021-12-15 LAB — CUP PACEART REMOTE DEVICE CHECK
Battery Remaining Longevity: 119 mo
Battery Voltage: 3 V
Brady Statistic AP VP Percent: 0.02 %
Brady Statistic AP VS Percent: 30.93 %
Brady Statistic AS VP Percent: 0.03 %
Brady Statistic AS VS Percent: 69.02 %
Brady Statistic RA Percent Paced: 30.75 %
Brady Statistic RV Percent Paced: 0.11 %
Date Time Interrogation Session: 20231011011223
Implantable Lead Implant Date: 20180410
Implantable Lead Implant Date: 20180410
Implantable Lead Location: 753859
Implantable Lead Location: 753860
Implantable Lead Model: 3830
Implantable Lead Model: 5076
Implantable Pulse Generator Implant Date: 20180410
Lead Channel Impedance Value: 323 Ohm
Lead Channel Impedance Value: 342 Ohm
Lead Channel Impedance Value: 437 Ohm
Lead Channel Impedance Value: 494 Ohm
Lead Channel Pacing Threshold Amplitude: 0.5 V
Lead Channel Pacing Threshold Amplitude: 1.25 V
Lead Channel Pacing Threshold Pulse Width: 0.4 ms
Lead Channel Pacing Threshold Pulse Width: 0.4 ms
Lead Channel Sensing Intrinsic Amplitude: 17.625 mV
Lead Channel Sensing Intrinsic Amplitude: 17.625 mV
Lead Channel Sensing Intrinsic Amplitude: 3.75 mV
Lead Channel Sensing Intrinsic Amplitude: 3.75 mV
Lead Channel Setting Pacing Amplitude: 1.5 V
Lead Channel Setting Pacing Amplitude: 2 V
Lead Channel Setting Pacing Pulse Width: 1 ms
Lead Channel Setting Sensing Sensitivity: 2 mV

## 2021-12-22 ENCOUNTER — Ambulatory Visit: Payer: Medicare Other | Attending: Internal Medicine | Admitting: *Deleted

## 2021-12-22 DIAGNOSIS — I480 Paroxysmal atrial fibrillation: Secondary | ICD-10-CM

## 2021-12-22 DIAGNOSIS — Z5181 Encounter for therapeutic drug level monitoring: Secondary | ICD-10-CM

## 2021-12-22 LAB — POCT INR: INR: 3.6 — AB (ref 2.0–3.0)

## 2021-12-22 NOTE — Patient Instructions (Addendum)
Description   Do not take any warfarin tomorrow then continue taking warfarin 1/2 tablet daily except for 1 tablet on Mondays. Please resume normal green intake. Recheck INR in 4 weeks (normally 6 weeks). Call Coumadin clinic with new medications [phone]

## 2021-12-29 NOTE — Progress Notes (Signed)
Remote pacemaker transmission.   

## 2022-01-19 ENCOUNTER — Ambulatory Visit: Payer: Medicare Other | Attending: Internal Medicine | Admitting: *Deleted

## 2022-01-19 DIAGNOSIS — Z5181 Encounter for therapeutic drug level monitoring: Secondary | ICD-10-CM | POA: Diagnosis not present

## 2022-01-19 DIAGNOSIS — I480 Paroxysmal atrial fibrillation: Secondary | ICD-10-CM | POA: Diagnosis not present

## 2022-01-19 LAB — POCT INR: INR: 3.7 — AB (ref 2.0–3.0)

## 2022-01-19 NOTE — Patient Instructions (Signed)
Description   Do not take any warfarin tomorrow (already taken today's dose) then start taking warfarin 1/2 tablet daily. Please resume normal green intake. Recheck INR in 4 weeks (normally 6 weeks). Call Coumadin clinic with new medications #[phone]

## 2022-02-16 ENCOUNTER — Ambulatory Visit: Payer: Medicare Other | Attending: Internal Medicine

## 2022-02-16 DIAGNOSIS — I480 Paroxysmal atrial fibrillation: Secondary | ICD-10-CM | POA: Diagnosis not present

## 2022-02-16 DIAGNOSIS — Z5181 Encounter for therapeutic drug level monitoring: Secondary | ICD-10-CM

## 2022-02-16 LAB — POCT INR: INR: 2.2 (ref 2.0–3.0)

## 2022-02-16 NOTE — Patient Instructions (Signed)
Description   Continue taking warfarin 1/2 tablet daily.  Please resume normal green intake.  Recheck INR in 5 weeks.  Call Coumadin clinic with new medications #[phone]

## 2022-03-16 ENCOUNTER — Ambulatory Visit (INDEPENDENT_AMBULATORY_CARE_PROVIDER_SITE_OTHER): Payer: Medicare Other

## 2022-03-16 DIAGNOSIS — I495 Sick sinus syndrome: Secondary | ICD-10-CM | POA: Diagnosis not present

## 2022-03-16 LAB — CUP PACEART REMOTE DEVICE CHECK
Battery Remaining Longevity: 117 mo
Battery Voltage: 3 V
Brady Statistic AP VP Percent: 0.02 %
Brady Statistic AP VS Percent: 34.47 %
Brady Statistic AS VP Percent: 0.03 %
Brady Statistic AS VS Percent: 65.48 %
Brady Statistic RA Percent Paced: 34.17 %
Brady Statistic RV Percent Paced: 0.15 %
Date Time Interrogation Session: 20240110003620
Implantable Lead Connection Status: 753985
Implantable Lead Connection Status: 753985
Implantable Lead Implant Date: 20180410
Implantable Lead Implant Date: 20180410
Implantable Lead Location: 753859
Implantable Lead Location: 753860
Implantable Lead Model: 3830
Implantable Lead Model: 5076
Implantable Pulse Generator Implant Date: 20180410
Lead Channel Impedance Value: 304 Ohm
Lead Channel Impedance Value: 323 Ohm
Lead Channel Impedance Value: 418 Ohm
Lead Channel Impedance Value: 456 Ohm
Lead Channel Pacing Threshold Amplitude: 0.5 V
Lead Channel Pacing Threshold Amplitude: 1.125 V
Lead Channel Pacing Threshold Pulse Width: 0.4 ms
Lead Channel Pacing Threshold Pulse Width: 0.4 ms
Lead Channel Sensing Intrinsic Amplitude: 3 mV
Lead Channel Sensing Intrinsic Amplitude: 3 mV
Lead Channel Sensing Intrinsic Amplitude: 8.125 mV
Lead Channel Sensing Intrinsic Amplitude: 8.125 mV
Lead Channel Setting Pacing Amplitude: 1.5 V
Lead Channel Setting Pacing Amplitude: 2 V
Lead Channel Setting Pacing Pulse Width: 1 ms
Lead Channel Setting Sensing Sensitivity: 2 mV
Zone Setting Status: 755011
Zone Setting Status: 755011

## 2022-03-23 ENCOUNTER — Ambulatory Visit: Payer: Medicare Other | Attending: Internal Medicine

## 2022-03-23 DIAGNOSIS — I480 Paroxysmal atrial fibrillation: Secondary | ICD-10-CM | POA: Diagnosis not present

## 2022-03-23 DIAGNOSIS — Z5181 Encounter for therapeutic drug level monitoring: Secondary | ICD-10-CM | POA: Diagnosis not present

## 2022-03-23 LAB — POCT INR: INR: 1.8 — AB (ref 2.0–3.0)

## 2022-03-23 NOTE — Patient Instructions (Signed)
Description   Take an extra 1/2 tablet today and then continue taking warfarin 1/2 tablet daily.  Stay consistent with greens each week  Recheck INR in 5 weeks.  Call Coumadin clinic with new medications [phone]

## 2022-04-07 NOTE — Progress Notes (Signed)
Remote pacemaker transmission.   

## 2022-04-27 ENCOUNTER — Ambulatory Visit: Payer: Medicare Other | Attending: Cardiology

## 2022-04-27 DIAGNOSIS — I480 Paroxysmal atrial fibrillation: Secondary | ICD-10-CM | POA: Diagnosis not present

## 2022-04-27 DIAGNOSIS — Z5181 Encounter for therapeutic drug level monitoring: Secondary | ICD-10-CM | POA: Insufficient documentation

## 2022-04-27 LAB — POCT INR: INR: 2.7 (ref 2.0–3.0)

## 2022-04-27 NOTE — Patient Instructions (Addendum)
Description   Continue taking warfarin 1/2 tablet daily.  Stay consistent with greens each week  Recheck INR in 6 weeks.  Call Coumadin clinic with new medications [phone]

## 2022-05-26 ENCOUNTER — Other Ambulatory Visit: Payer: Self-pay | Admitting: Internal Medicine

## 2022-05-26 DIAGNOSIS — I480 Paroxysmal atrial fibrillation: Secondary | ICD-10-CM

## 2022-06-08 ENCOUNTER — Ambulatory Visit: Payer: Medicare Other | Attending: Internal Medicine

## 2022-06-08 DIAGNOSIS — I480 Paroxysmal atrial fibrillation: Secondary | ICD-10-CM | POA: Insufficient documentation

## 2022-06-08 DIAGNOSIS — Z5181 Encounter for therapeutic drug level monitoring: Secondary | ICD-10-CM | POA: Diagnosis not present

## 2022-06-08 LAB — POCT INR: INR: 2.1 (ref 2.0–3.0)

## 2022-06-08 NOTE — Patient Instructions (Signed)
Description   Take an extra 1/2 tablet today and then continue taking warfarin 1/2 tablet daily.  Stay consistent with greens each week  Recheck INR in 6 weeks.  Call Coumadin clinic with new medications [phone]

## 2022-06-15 ENCOUNTER — Ambulatory Visit (INDEPENDENT_AMBULATORY_CARE_PROVIDER_SITE_OTHER): Payer: Medicare Other

## 2022-06-15 DIAGNOSIS — I495 Sick sinus syndrome: Secondary | ICD-10-CM | POA: Diagnosis not present

## 2022-06-15 LAB — CUP PACEART REMOTE DEVICE CHECK
Battery Remaining Longevity: 115 mo
Battery Voltage: 3 V
Brady Statistic AP VP Percent: 0.01 %
Brady Statistic AP VS Percent: 24.2 %
Brady Statistic AS VP Percent: 0.03 %
Brady Statistic AS VS Percent: 75.77 %
Brady Statistic RA Percent Paced: 23.07 %
Brady Statistic RV Percent Paced: 0.42 %
Date Time Interrogation Session: 20240410010249
Implantable Lead Connection Status: 753985
Implantable Lead Connection Status: 753985
Implantable Lead Implant Date: 20180410
Implantable Lead Implant Date: 20180410
Implantable Lead Location: 753859
Implantable Lead Location: 753860
Implantable Lead Model: 3830
Implantable Lead Model: 5076
Implantable Pulse Generator Implant Date: 20180410
Lead Channel Impedance Value: 304 Ohm
Lead Channel Impedance Value: 304 Ohm
Lead Channel Impedance Value: 437 Ohm
Lead Channel Impedance Value: 437 Ohm
Lead Channel Pacing Threshold Amplitude: 0.625 V
Lead Channel Pacing Threshold Amplitude: 1.125 V
Lead Channel Pacing Threshold Pulse Width: 0.4 ms
Lead Channel Pacing Threshold Pulse Width: 0.4 ms
Lead Channel Sensing Intrinsic Amplitude: 3.625 mV
Lead Channel Sensing Intrinsic Amplitude: 3.625 mV
Lead Channel Sensing Intrinsic Amplitude: 8.5 mV
Lead Channel Sensing Intrinsic Amplitude: 8.5 mV
Lead Channel Setting Pacing Amplitude: 1.5 V
Lead Channel Setting Pacing Amplitude: 2 V
Lead Channel Setting Pacing Pulse Width: 1 ms
Lead Channel Setting Sensing Sensitivity: 2 mV
Zone Setting Status: 755011
Zone Setting Status: 755011

## 2022-07-20 ENCOUNTER — Ambulatory Visit: Payer: Medicare Other | Attending: Cardiovascular Disease | Admitting: *Deleted

## 2022-07-20 DIAGNOSIS — Z5181 Encounter for therapeutic drug level monitoring: Secondary | ICD-10-CM | POA: Diagnosis not present

## 2022-07-20 DIAGNOSIS — I480 Paroxysmal atrial fibrillation: Secondary | ICD-10-CM | POA: Insufficient documentation

## 2022-07-20 LAB — POCT INR: INR: 2 (ref 2.0–3.0)

## 2022-07-20 NOTE — Patient Instructions (Addendum)
Description   Take an extra 1/2 tablet (1 full tablet) today and then continue taking warfarin 1/2 tablet daily. Stay consistent with greens each week  Recheck INR in 6 weeks.  Call Coumadin clinic with new medications #[phone]

## 2022-07-22 NOTE — Progress Notes (Signed)
Remote pacemaker transmission.   

## 2022-07-30 ENCOUNTER — Other Ambulatory Visit: Payer: Self-pay | Admitting: Adult Health

## 2022-08-03 ENCOUNTER — Other Ambulatory Visit: Payer: Self-pay

## 2022-08-31 ENCOUNTER — Ambulatory Visit: Payer: Medicare Other | Attending: Internal Medicine | Admitting: *Deleted

## 2022-08-31 DIAGNOSIS — Z5181 Encounter for therapeutic drug level monitoring: Secondary | ICD-10-CM | POA: Diagnosis not present

## 2022-08-31 DIAGNOSIS — I480 Paroxysmal atrial fibrillation: Secondary | ICD-10-CM | POA: Diagnosis not present

## 2022-08-31 LAB — POCT INR: INR: 2.6 (ref 2.0–3.0)

## 2022-08-31 NOTE — Patient Instructions (Signed)
Description   Continue taking warfarin 1/2 tablet daily.  Stay consistent with greens each week  Recheck INR in 6 weeks.  Call Coumadin clinic with new medications #[phone]      

## 2022-09-14 ENCOUNTER — Ambulatory Visit (INDEPENDENT_AMBULATORY_CARE_PROVIDER_SITE_OTHER): Payer: Medicare Other

## 2022-09-14 DIAGNOSIS — I495 Sick sinus syndrome: Secondary | ICD-10-CM

## 2022-09-16 LAB — CUP PACEART REMOTE DEVICE CHECK
Battery Remaining Longevity: 111 mo
Battery Voltage: 2.99 V
Brady Statistic AP VP Percent: 0.02 %
Brady Statistic AP VS Percent: 30.05 %
Brady Statistic AS VP Percent: 0.03 %
Brady Statistic AS VS Percent: 69.91 %
Brady Statistic RA Percent Paced: 29.57 %
Brady Statistic RV Percent Paced: 0.27 %
Date Time Interrogation Session: 20240711005440
Implantable Lead Connection Status: 753985
Implantable Lead Connection Status: 753985
Implantable Lead Implant Date: 20180410
Implantable Lead Implant Date: 20180410
Implantable Lead Location: 753859
Implantable Lead Location: 753860
Implantable Lead Model: 3830
Implantable Lead Model: 5076
Implantable Pulse Generator Implant Date: 20180410
Lead Channel Impedance Value: 304 Ohm
Lead Channel Impedance Value: 304 Ohm
Lead Channel Impedance Value: 418 Ohm
Lead Channel Impedance Value: 437 Ohm
Lead Channel Pacing Threshold Amplitude: 0.5 V
Lead Channel Pacing Threshold Amplitude: 1.125 V
Lead Channel Pacing Threshold Pulse Width: 0.4 ms
Lead Channel Pacing Threshold Pulse Width: 0.4 ms
Lead Channel Sensing Intrinsic Amplitude: 3.375 mV
Lead Channel Sensing Intrinsic Amplitude: 3.375 mV
Lead Channel Sensing Intrinsic Amplitude: 6.5 mV
Lead Channel Sensing Intrinsic Amplitude: 6.5 mV
Lead Channel Setting Pacing Amplitude: 1.5 V
Lead Channel Setting Pacing Amplitude: 2 V
Lead Channel Setting Pacing Pulse Width: 1 ms
Lead Channel Setting Sensing Sensitivity: 2 mV
Zone Setting Status: 755011
Zone Setting Status: 755011

## 2022-09-19 ENCOUNTER — Telehealth: Payer: Self-pay | Admitting: Internal Medicine

## 2022-09-19 ENCOUNTER — Telehealth: Payer: Self-pay | Admitting: Adult Health

## 2022-09-19 NOTE — Telephone Encounter (Signed)
Pt called stating that last Friday the 12th he was standing in his sister's home and all of a sudden he didn't feel his L leg and fell right to the floor. He states he crawled to the cough and go up and all of a sudden he had feeling again. Pt would like to discuss with NP or RN to see if any of the meds he's on can cause that or what could it be.

## 2022-09-19 NOTE — Telephone Encounter (Signed)
Returned patient call and patient just wanted to make an appointment for his year f/u. Pt sch for 11/02/22 @ 10:15am.

## 2022-09-19 NOTE — Telephone Encounter (Signed)
Patient is calling because he is having issues with his heart monitor. Patient stated he has not been able to send his transmission, but would like to know if we can send a new one either to his sister Judy's address or to his address. Please advise.

## 2022-09-20 NOTE — Telephone Encounter (Signed)
LMOVM for pt to give us a call back. 

## 2022-10-06 NOTE — Progress Notes (Signed)
Remote pacemaker transmission.   

## 2022-10-12 ENCOUNTER — Ambulatory Visit: Payer: Medicare Other

## 2022-10-19 ENCOUNTER — Ambulatory Visit: Payer: Medicare Other | Attending: Internal Medicine | Admitting: Internal Medicine

## 2022-10-19 ENCOUNTER — Ambulatory Visit (INDEPENDENT_AMBULATORY_CARE_PROVIDER_SITE_OTHER): Payer: Medicare Other

## 2022-10-19 VITALS — BP 134/86 | HR 69 | Ht 70.0 in | Wt 172.0 lb

## 2022-10-19 DIAGNOSIS — I495 Sick sinus syndrome: Secondary | ICD-10-CM | POA: Diagnosis not present

## 2022-10-19 DIAGNOSIS — Z5181 Encounter for therapeutic drug level monitoring: Secondary | ICD-10-CM | POA: Insufficient documentation

## 2022-10-19 DIAGNOSIS — I480 Paroxysmal atrial fibrillation: Secondary | ICD-10-CM | POA: Diagnosis not present

## 2022-10-19 LAB — CUP PACEART INCLINIC DEVICE CHECK
Battery Remaining Longevity: 110 mo
Battery Voltage: 2.99 V
Brady Statistic AP VP Percent: 0.02 %
Brady Statistic AP VS Percent: 30.55 %
Brady Statistic AS VP Percent: 0.03 %
Brady Statistic AS VS Percent: 69.41 %
Brady Statistic RA Percent Paced: 29.92 %
Brady Statistic RV Percent Paced: 0.26 %
Date Time Interrogation Session: 20240814160101
Implantable Lead Connection Status: 753985
Implantable Lead Connection Status: 753985
Implantable Lead Implant Date: 20180410
Implantable Lead Implant Date: 20180410
Implantable Lead Location: 753859
Implantable Lead Location: 753860
Implantable Lead Model: 3830
Implantable Lead Model: 5076
Implantable Pulse Generator Implant Date: 20180410
Lead Channel Impedance Value: 304 Ohm
Lead Channel Impedance Value: 342 Ohm
Lead Channel Impedance Value: 437 Ohm
Lead Channel Impedance Value: 494 Ohm
Lead Channel Pacing Threshold Amplitude: 0.5 V
Lead Channel Pacing Threshold Amplitude: 1.125 V
Lead Channel Pacing Threshold Pulse Width: 0.4 ms
Lead Channel Pacing Threshold Pulse Width: 0.4 ms
Lead Channel Sensing Intrinsic Amplitude: 10.375 mV
Lead Channel Sensing Intrinsic Amplitude: 3.125 mV
Lead Channel Sensing Intrinsic Amplitude: 3.5 mV
Lead Channel Sensing Intrinsic Amplitude: 6.375 mV
Lead Channel Setting Pacing Amplitude: 1.5 V
Lead Channel Setting Pacing Amplitude: 2 V
Lead Channel Setting Pacing Pulse Width: 1 ms
Lead Channel Setting Sensing Sensitivity: 2 mV
Zone Setting Status: 755011
Zone Setting Status: 755011

## 2022-10-19 LAB — POCT INR: INR: 2.3 (ref 2.0–3.0)

## 2022-10-19 NOTE — Progress Notes (Signed)
HPI Mr. Calahan returns today for PPM followup. He is a pleasant 72 yo man with a h/o sinus node dysfunction, s/p PPM insertion. He has not had any symptomatic atrial fib but his PPM interogation demonstrates a few episodes lasting several hours. He is on coumdin. He feels well. He had a syncopal episode with no arrhythmias noted on the PPM interogation.  No Known Allergies   Current Outpatient Medications  Medication Sig Dispense Refill   levETIRAcetam (KEPPRA) 500 MG tablet TAKE 1 TABLET BY MOUTH TWICE A DAY 180 tablet 0   metoprolol tartrate (LOPRESSOR) 25 MG tablet Take 1 tablet (25 mg total) by mouth 2 (two) times daily. 180 tablet 3   vitamin B-12 (CYANOCOBALAMIN) 1000 MCG tablet Take 1 tablet (1,000 mcg total) by mouth daily. 30 tablet 0   warfarin (COUMADIN) 5 MG tablet TAKE 1/2 TO 1 TABLET DAILY AS DIRECTED BY COUMADIN CLINIC 60 tablet 1   No current facility-administered medications for this visit.     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   Past Surgical History:  Procedure Laterality Date   PACEMAKER IMPLANT N/A 06/14/2016   Procedure: Pacemaker Implant-Dual chamber;  Surgeon: [name], MD;  Location: [facility] INVASIVE CV LAB;  Service: Cardiovascular;  Laterality: N/A;     Family History  Problem Relation Age of Onset   Hypertension Sister    Healthy Mother     Seizures Neg Hx      Social History   Socioeconomic History   Marital status: Single    Spouse name: Not on file   Number of children: Not on file   Years of education: 16   Highest education level: Bachelor's degree (e.g., BA, AB, BS)  Occupational History   Occupation: Charity fundraiser  Tobacco Use   Smoking status: Every Day    Current packs/day: 1.00    Types: Cigarettes   Smokeless tobacco: Never   Tobacco comments:    Does not inhale cigarette smoke  Vaping Use   Vaping status: Never Used  Substance and Sexual Activity   Alcohol use: No   Drug use: Not Currently   Sexual activity: Not on file  Other Topics Concern   Not on file  Social History Narrative   Live Apt alone.  Working as a Charity fundraiser.  Barista.      Social Determinants of Health   Financial Resource Strain: Low Risk  (07/23/2021)   Overall Financial Resource Strain (CARDIA)    Difficulty of Paying Living Expenses: Not hard at all  Food Insecurity: No Food Insecurity (07/23/2021)   Hunger Vital Sign    Worried About Running Out of Food in the Last Year: Never true    Ran Out of Food in the Last Year: Never true  Transportation  Needs: No Transportation Needs (07/23/2021)   PRAPARE - Administrator, Civil Service (Medical): No    Lack of Transportation (Non-Medical): No  Physical Activity: Sufficiently Active (07/23/2021)   Exercise Vital Sign    Days of Exercise per Week: 4 days    Minutes of Exercise per Session: 40 min  Stress: No Stress Concern Present (07/23/2021)   [employer] of Occupational Health - Occupational Stress Questionnaire    Feeling of Stress : Not at all  Social Connections: Socially Isolated (07/23/2021)   Social Connection and Isolation Panel [NHANES]    Frequency of Communication with Friends and Family: More than three times a week    Frequency of Social Gatherings with Friends and Family: More than three times a week    Attends Religious Services: Never    Automotive engineer or Organizations: No    Attends Banker Meetings: Never    Marital Status: Never married  Intimate Partner Violence: Not At Risk (07/23/2021)   Humiliation, Afraid, Rape, and Kick questionnaire    Fear of Current or Ex-Partner: No    Emotionally Abused: No    Physically Abused: No    Sexually Abused: No     BP 134/86   Pulse 69   Ht 5\' 10"  (1.778 m)   Wt 172 lb (78 kg)   SpO2 96%   BMI 24.68 kg/m   Physical Exam:  Well appearing NAD HEENT: Unremarkable Neck:  No JVD, no thyromegally Lymphatics:  No adenopathy Back:  No CVA tenderness Lungs:  Clear HEART:  Regular rate rhythm, no murmurs, no rubs, no clicks Abd:  soft, positive bowel sounds, no organomegally, no rebound, no guarding Ext:  2 plus pulses, no edema, no cyanosis, no clubbing Skin:  No rashes no nodules Neuro:  CN II through XII intact, motor grossly intact  EKG - nsr  DEVICE  Normal device function.  See PaceArt for details.   Assess/Plan: Sinus node dysfunction - he is asymptomatic s/p PPM insertion. PPM - his medtronic DDD PM is working normally. PAF - he has been out of rhythm 8 hours in the last year. Continue blood thinners. Tobacco abuse - I encouraged him to stop smoking.  Sharlot Gowda Verlin Duke,MD

## 2022-10-19 NOTE — Patient Instructions (Signed)
Description   Continue taking warfarin 1/2 tablet daily.  Stay consistent with greens each week  Recheck INR in 6 weeks.  Call Coumadin clinic with new medications #[phone]      

## 2022-10-19 NOTE — Patient Instructions (Signed)

## 2022-10-27 ENCOUNTER — Telehealth: Payer: Self-pay

## 2022-10-27 NOTE — Telephone Encounter (Signed)
Medtronic ordered the patient a new Relay monitor. He should receive it in 7-10 business days.
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
## 2022-11-02 ENCOUNTER — Encounter: Payer: Self-pay | Admitting: Adult Health

## 2022-11-02 ENCOUNTER — Ambulatory Visit (INDEPENDENT_AMBULATORY_CARE_PROVIDER_SITE_OTHER): Payer: Medicare Other | Admitting: Adult Health

## 2022-11-02 VITALS — BP 137/85 | HR 68 | Ht 69.0 in | Wt 169.0 lb

## 2022-11-02 DIAGNOSIS — R200 Anesthesia of skin: Secondary | ICD-10-CM | POA: Diagnosis not present

## 2022-11-02 DIAGNOSIS — R569 Unspecified convulsions: Secondary | ICD-10-CM

## 2022-11-02 MED ORDER — LEVETIRACETAM 500 MG PO TABS: 500.0000 mg | ORAL_TABLET | Freq: Two times a day (BID) | ORAL | 3 refills | Status: DC

## 2022-11-02 NOTE — Patient Instructions (Addendum)
Your Plan:  Continue Keppra 500mg  twice daily for seizure prevention  Suspect left leg symptoms came from your lower back, if this should occur again, please contact your primary doctor for further evaluation, may need to get back in with orthopedics or see a neurosurgeon   If this should reoccur and associated with arm and face symptoms, please call 911 immediately for emergent evaluation      Follow up in 1 year or call earlier if needed     Thank you for coming to see [place] at [facility]. I hope we have been able to provide you high quality care today.  You may receive a patient satisfaction survey over the next few weeks. We would appreciate your feedback and comments so that we may continue to improve ourselves and the health of our patients.

## 2022-11-15 DIAGNOSIS — L728 Other follicular cysts of the skin and subcutaneous tissue: Secondary | ICD-10-CM | POA: Diagnosis not present

## 2022-11-15 DIAGNOSIS — L821 Other seborrheic keratosis: Secondary | ICD-10-CM | POA: Diagnosis not present

## 2022-11-15 DIAGNOSIS — L298 Other pruritus: Secondary | ICD-10-CM | POA: Diagnosis not present

## 2022-11-30 ENCOUNTER — Ambulatory Visit: Payer: Medicare Other

## 2022-11-30 ENCOUNTER — Other Ambulatory Visit: Payer: Self-pay | Admitting: Internal Medicine

## 2022-11-30 ENCOUNTER — Other Ambulatory Visit: Payer: Self-pay | Admitting: Student

## 2022-11-30 DIAGNOSIS — I480 Paroxysmal atrial fibrillation: Secondary | ICD-10-CM

## 2022-12-01 ENCOUNTER — Other Ambulatory Visit: Payer: Self-pay

## 2022-12-01 DIAGNOSIS — I480 Paroxysmal atrial fibrillation: Secondary | ICD-10-CM

## 2022-12-01 MED ORDER — METOPROLOL TARTRATE 25 MG PO TABS: 25.0000 mg | ORAL_TABLET | Freq: Two times a day (BID) | ORAL | 3 refills | Status: DC

## 2022-12-01 NOTE — Telephone Encounter (Signed)
Prescription refill request received for warfarin Lov: 10/19/22 [name])  Next INR check: 12/05/22  Warfarin tablet strength: 5mg   Appropriate dose. Refill sent.

## 2022-12-05 ENCOUNTER — Ambulatory Visit: Payer: Medicare Other | Attending: Internal Medicine

## 2022-12-05 DIAGNOSIS — Z5181 Encounter for therapeutic drug level monitoring: Secondary | ICD-10-CM | POA: Diagnosis not present

## 2022-12-05 DIAGNOSIS — I480 Paroxysmal atrial fibrillation: Secondary | ICD-10-CM

## 2022-12-05 LAB — POCT INR: INR: 2.3 (ref 2.0–3.0)

## 2022-12-05 NOTE — Patient Instructions (Signed)
Continue taking warfarin 1/2 tablet daily. Stay consistent with greens each week  Recheck INR in 6 weeks.  Call Coumadin clinic with new medications #[phone]

## 2022-12-14 ENCOUNTER — Ambulatory Visit (INDEPENDENT_AMBULATORY_CARE_PROVIDER_SITE_OTHER): Payer: Medicare Other

## 2022-12-14 DIAGNOSIS — I495 Sick sinus syndrome: Secondary | ICD-10-CM | POA: Diagnosis not present

## 2022-12-14 LAB — CUP PACEART REMOTE DEVICE CHECK
Battery Remaining Longevity: 108 mo
Battery Voltage: 2.99 V
Brady Statistic AP VP Percent: 0.02 %
Brady Statistic AP VS Percent: 37.85 %
Brady Statistic AS VP Percent: 0.03 %
Brady Statistic AS VS Percent: 62.11 %
Brady Statistic RA Percent Paced: 37.39 %
Brady Statistic RV Percent Paced: 0.18 %
Date Time Interrogation Session: 20241008222018
Implantable Lead Connection Status: 753985
Implantable Lead Connection Status: 753985
Implantable Lead Implant Date: 20180410
Implantable Lead Implant Date: 20180410
Implantable Lead Location: 753859
Implantable Lead Location: 753860
Implantable Lead Model: 3830
Implantable Lead Model: 5076
Implantable Pulse Generator Implant Date: 20180410
Lead Channel Impedance Value: 285 Ohm
Lead Channel Impedance Value: 304 Ohm
Lead Channel Impedance Value: 418 Ohm
Lead Channel Impedance Value: 437 Ohm
Lead Channel Pacing Threshold Amplitude: 0.5 V
Lead Channel Pacing Threshold Amplitude: 1.25 V
Lead Channel Pacing Threshold Pulse Width: 0.4 ms
Lead Channel Pacing Threshold Pulse Width: 0.4 ms
Lead Channel Sensing Intrinsic Amplitude: 3 mV
Lead Channel Sensing Intrinsic Amplitude: 3 mV
Lead Channel Sensing Intrinsic Amplitude: 7 mV
Lead Channel Sensing Intrinsic Amplitude: 7 mV
Lead Channel Setting Pacing Amplitude: 1.5 V
Lead Channel Setting Pacing Amplitude: 2 V
Lead Channel Setting Pacing Pulse Width: 1 ms
Lead Channel Setting Sensing Sensitivity: 2 mV
Zone Setting Status: 755011
Zone Setting Status: 755011

## 2022-12-20 ENCOUNTER — Telehealth: Payer: Self-pay

## 2022-12-20 NOTE — Telephone Encounter (Signed)
Unscheduled manual transmission received.  Outreach made to Pt.  Per Pt he sent transmission because he had forgotten transmission due earlier in the week.  He denies any symptoms.  No action needed.

## 2023-01-02 NOTE — Progress Notes (Signed)
Remote pacemaker transmission.   

## 2023-01-09 ENCOUNTER — Telehealth: Payer: Self-pay | Admitting: Adult Health

## 2023-01-09 NOTE — Telephone Encounter (Signed)
Called the number that was listed as the one that called in and has primary contact and this number actually is his sister. They are requesting that the [phone] number be changed as the first number to contact for the patient.  I will change this to reflect it.  Called the pt back and he states that this is similar to the same event he has had before. This has occurred 2-3 more times since the discussion in aug.  Advised that he was on the phone talking to his sister and all of sudden the leg just gave out on him and he fell. He states there was no warning. States that he was completely aware and never had LOC.  Advised per the notes in aug Shanda Bumps had mentioned may be worth following up with orthopedic MD. He had worked with them and he had completed PT which didn't help he said and was costing him too much money. Advised it may be worth reaching back to them but that I would make sure that Shanda Bumps didn't have any other recommendations from a neurology concern.

## 2023-01-09 NOTE — Telephone Encounter (Signed)
Pt is asking for a call to discuss increase in falls, he had one last night as a result of leg just giving out of him.  Pt states he was feeling fine, it was an all of a sudden fall, he was just fine before and after, pt asking for a call to discuss.

## 2023-01-09 NOTE — Telephone Encounter (Signed)
Did he have a f/u with Dr. Shon Baton since I saw him in August? If not, I would highly recommend he do this as there may be a nerve compression causing his leg to give out. If he has seen them since August, can we contact ortho to get recent visit notes?

## 2023-01-16 ENCOUNTER — Ambulatory Visit: Payer: Medicare Other | Attending: Cardiology

## 2023-01-16 DIAGNOSIS — I480 Paroxysmal atrial fibrillation: Secondary | ICD-10-CM | POA: Diagnosis not present

## 2023-01-16 DIAGNOSIS — Z5181 Encounter for therapeutic drug level monitoring: Secondary | ICD-10-CM | POA: Insufficient documentation

## 2023-01-16 LAB — POCT INR: INR: 2.6 (ref 2.0–3.0)

## 2023-01-16 NOTE — Patient Instructions (Signed)
Continue taking warfarin 1/2 tablet daily. Stay consistent with greens each week  Recheck INR in 6 weeks.  Call Coumadin clinic with new medications #[phone]

## 2023-01-18 NOTE — Telephone Encounter (Signed)
Called the patient back and advised of the recommendation that Shanda Bumps confirmed would be the better route. Encouraged the patient to continue to work on getting established with a primary care MD as well. Pt verbalized understanding.

## 2023-02-27 ENCOUNTER — Ambulatory Visit: Payer: Medicare Other | Attending: Cardiology

## 2023-02-27 DIAGNOSIS — I480 Paroxysmal atrial fibrillation: Secondary | ICD-10-CM | POA: Diagnosis not present

## 2023-02-27 DIAGNOSIS — Z5181 Encounter for therapeutic drug level monitoring: Secondary | ICD-10-CM | POA: Insufficient documentation

## 2023-02-27 LAB — POCT INR: INR: 3.3 — AB (ref 2.0–3.0)

## 2023-02-27 NOTE — Patient Instructions (Signed)
HOLD TOMORROW ONLY THEN Continue taking warfarin 1/2 tablet daily. Stay consistent with greens each week  Recheck INR in 6 weeks.  Call Coumadin clinic with new medications #[phone]

## 2023-03-15 ENCOUNTER — Ambulatory Visit: Payer: Medicare Other

## 2023-03-15 DIAGNOSIS — I495 Sick sinus syndrome: Secondary | ICD-10-CM | POA: Diagnosis not present

## 2023-03-16 LAB — CUP PACEART REMOTE DEVICE CHECK
Battery Remaining Longevity: 107 mo
Battery Voltage: 2.99 V
Brady Statistic AP VP Percent: 0.02 %
Brady Statistic AP VS Percent: 32.62 %
Brady Statistic AS VP Percent: 0.03 %
Brady Statistic AS VS Percent: 67.35 %
Brady Statistic RA Percent Paced: 31.43 %
Brady Statistic RV Percent Paced: 0.4 %
Date Time Interrogation Session: 20250108002801
Implantable Lead Connection Status: 753985
Implantable Lead Connection Status: 753985
Implantable Lead Implant Date: 20180410
Implantable Lead Implant Date: 20180410
Implantable Lead Location: 753859
Implantable Lead Location: 753860
Implantable Lead Model: 3830
Implantable Lead Model: 5076
Implantable Pulse Generator Implant Date: 20180410
Lead Channel Impedance Value: 304 Ohm
Lead Channel Impedance Value: 304 Ohm
Lead Channel Impedance Value: 437 Ohm
Lead Channel Impedance Value: 437 Ohm
Lead Channel Pacing Threshold Amplitude: 0.5 V
Lead Channel Pacing Threshold Amplitude: 1.125 V
Lead Channel Pacing Threshold Pulse Width: 0.4 ms
Lead Channel Pacing Threshold Pulse Width: 0.4 ms
Lead Channel Sensing Intrinsic Amplitude: 3.625 mV
Lead Channel Sensing Intrinsic Amplitude: 3.625 mV
Lead Channel Sensing Intrinsic Amplitude: 7.125 mV
Lead Channel Sensing Intrinsic Amplitude: 7.125 mV
Lead Channel Setting Pacing Amplitude: 1.5 V
Lead Channel Setting Pacing Amplitude: 2 V
Lead Channel Setting Pacing Pulse Width: 1 ms
Lead Channel Setting Sensing Sensitivity: 2 mV
Zone Setting Status: 755011
Zone Setting Status: 755011

## 2023-03-20 ENCOUNTER — Other Ambulatory Visit: Payer: Self-pay | Admitting: Internal Medicine

## 2023-03-20 DIAGNOSIS — I480 Paroxysmal atrial fibrillation: Secondary | ICD-10-CM

## 2023-04-10 ENCOUNTER — Ambulatory Visit: Payer: Medicare Other | Attending: Internal Medicine | Admitting: *Deleted

## 2023-04-10 DIAGNOSIS — I480 Paroxysmal atrial fibrillation: Secondary | ICD-10-CM | POA: Insufficient documentation

## 2023-04-10 DIAGNOSIS — Z5181 Encounter for therapeutic drug level monitoring: Secondary | ICD-10-CM | POA: Diagnosis present

## 2023-04-10 LAB — POCT INR: INR: 2 (ref 2.0–3.0)

## 2023-04-10 NOTE — Patient Instructions (Signed)
Description   Today take 1 tablet of warfarin then continue taking warfarin 1/2 tablet daily. Stay consistent with greens each week  Recheck INR in 6 weeks.  Call Coumadin clinic with new medications #[phone]

## 2023-04-27 NOTE — Addendum Note (Signed)
Addended by: Elease Etienne A on: 04/27/2023 03:16 PM   Modules accepted: Orders

## 2023-04-27 NOTE — Progress Notes (Signed)
 Remote pacemaker transmission.

## 2023-05-02 DIAGNOSIS — Z0289 Encounter for other administrative examinations: Secondary | ICD-10-CM

## 2023-05-15 ENCOUNTER — Telehealth: Payer: Self-pay | Admitting: Neurology

## 2023-05-15 NOTE — Telephone Encounter (Signed)
 Received DMV forms for the pt. Was able to complete and NP signed and given back to medical records for the pt.

## 2023-05-16 ENCOUNTER — Telehealth: Payer: Self-pay | Admitting: *Deleted

## 2023-05-16 NOTE — Telephone Encounter (Signed)
 Pt dmv form fax on 05/16/2023

## 2023-05-22 ENCOUNTER — Ambulatory Visit: Payer: Medicare Other | Attending: Internal Medicine | Admitting: *Deleted

## 2023-05-22 DIAGNOSIS — I480 Paroxysmal atrial fibrillation: Secondary | ICD-10-CM

## 2023-05-22 DIAGNOSIS — Z5181 Encounter for therapeutic drug level monitoring: Secondary | ICD-10-CM | POA: Diagnosis not present

## 2023-05-22 LAB — POCT INR: INR: 2.5 (ref 2.0–3.0)

## 2023-05-22 NOTE — Patient Instructions (Signed)
Description   Continue taking warfarin 1/2 tablet daily.  Stay consistent with greens each week  Recheck INR in 6 weeks.  Call Coumadin clinic with new medications #[phone]      

## 2023-05-25 NOTE — Telephone Encounter (Signed)
 Called to check if DMV forms were faxed again 05/18/23. Transferred call to Medical Records.

## 2023-06-14 ENCOUNTER — Ambulatory Visit (INDEPENDENT_AMBULATORY_CARE_PROVIDER_SITE_OTHER): Payer: Medicare Other

## 2023-06-14 DIAGNOSIS — I495 Sick sinus syndrome: Secondary | ICD-10-CM

## 2023-06-14 LAB — CUP PACEART REMOTE DEVICE CHECK
Battery Remaining Longevity: 102 mo
Battery Voltage: 2.99 V
Brady Statistic AP VP Percent: 0.02 %
Brady Statistic AP VS Percent: 35.49 %
Brady Statistic AS VP Percent: 0.02 %
Brady Statistic AS VS Percent: 64.48 %
Brady Statistic RA Percent Paced: 35.2 %
Brady Statistic RV Percent Paced: 0.36 %
Date Time Interrogation Session: 20250408235013
Implantable Lead Connection Status: 753985
Implantable Lead Connection Status: 753985
Implantable Lead Implant Date: 20180410
Implantable Lead Implant Date: 20180410
Implantable Lead Location: 753859
Implantable Lead Location: 753860
Implantable Lead Model: 3830
Implantable Lead Model: 5076
Implantable Pulse Generator Implant Date: 20180410
Lead Channel Impedance Value: 285 Ohm
Lead Channel Impedance Value: 304 Ohm
Lead Channel Impedance Value: 399 Ohm
Lead Channel Impedance Value: 437 Ohm
Lead Channel Pacing Threshold Amplitude: 0.5 V
Lead Channel Pacing Threshold Amplitude: 1.125 V
Lead Channel Pacing Threshold Pulse Width: 0.4 ms
Lead Channel Pacing Threshold Pulse Width: 0.4 ms
Lead Channel Sensing Intrinsic Amplitude: 3.25 mV
Lead Channel Sensing Intrinsic Amplitude: 3.25 mV
Lead Channel Sensing Intrinsic Amplitude: 7.75 mV
Lead Channel Sensing Intrinsic Amplitude: 7.75 mV
Lead Channel Setting Pacing Amplitude: 1.5 V
Lead Channel Setting Pacing Amplitude: 2 V
Lead Channel Setting Pacing Pulse Width: 1 ms
Lead Channel Setting Sensing Sensitivity: 2 mV
Zone Setting Status: 755011
Zone Setting Status: 755011

## 2023-07-03 ENCOUNTER — Ambulatory Visit: Attending: Internal Medicine

## 2023-07-03 DIAGNOSIS — I480 Paroxysmal atrial fibrillation: Secondary | ICD-10-CM | POA: Insufficient documentation

## 2023-07-03 DIAGNOSIS — Z5181 Encounter for therapeutic drug level monitoring: Secondary | ICD-10-CM | POA: Diagnosis present

## 2023-07-03 LAB — POCT INR: INR: 2.2 (ref 2.0–3.0)

## 2023-07-03 NOTE — Patient Instructions (Signed)
Continue taking warfarin 1/2 tablet daily. Stay consistent with greens each week  Recheck INR in 6 weeks.  Call Coumadin clinic with new medications #[phone]

## 2023-07-14 ENCOUNTER — Other Ambulatory Visit: Payer: Self-pay | Admitting: Internal Medicine

## 2023-07-14 DIAGNOSIS — I480 Paroxysmal atrial fibrillation: Secondary | ICD-10-CM

## 2023-07-28 NOTE — Progress Notes (Signed)
 Remote pacemaker transmission.

## 2023-08-14 ENCOUNTER — Ambulatory Visit: Attending: Internal Medicine | Admitting: *Deleted

## 2023-08-14 DIAGNOSIS — I480 Paroxysmal atrial fibrillation: Secondary | ICD-10-CM | POA: Diagnosis present

## 2023-08-14 DIAGNOSIS — Z5181 Encounter for therapeutic drug level monitoring: Secondary | ICD-10-CM | POA: Insufficient documentation

## 2023-08-14 LAB — POCT INR: INR: 2.5 (ref 2.0–3.0)

## 2023-08-14 NOTE — Patient Instructions (Addendum)
 Description   Continue taking warfarin 1/2 tablet daily. Stay consistent with greens each week  Recheck INR in 6 weeks.  Call Coumadin  clinic with new medications [phone]

## 2023-09-13 ENCOUNTER — Ambulatory Visit: Payer: Medicare Other

## 2023-09-13 DIAGNOSIS — I495 Sick sinus syndrome: Secondary | ICD-10-CM

## 2023-09-14 ENCOUNTER — Ambulatory Visit: Payer: Self-pay | Admitting: Internal Medicine

## 2023-09-14 LAB — CUP PACEART REMOTE DEVICE CHECK
Battery Remaining Longevity: 97 mo
Battery Voltage: 2.98 V
Brady Statistic AP VP Percent: 0.02 %
Brady Statistic AP VS Percent: 43.16 %
Brady Statistic AS VP Percent: 0.02 %
Brady Statistic AS VS Percent: 56.8 %
Brady Statistic RA Percent Paced: 42.12 %
Brady Statistic RV Percent Paced: 0.38 %
Date Time Interrogation Session: 20250708235112
Implantable Lead Connection Status: 753985
Implantable Lead Connection Status: 753985
Implantable Lead Implant Date: 20180410
Implantable Lead Implant Date: 20180410
Implantable Lead Location: 753859
Implantable Lead Location: 753860
Implantable Lead Model: 3830
Implantable Lead Model: 5076
Implantable Pulse Generator Implant Date: 20180410
Lead Channel Impedance Value: 285 Ohm
Lead Channel Impedance Value: 304 Ohm
Lead Channel Impedance Value: 399 Ohm
Lead Channel Impedance Value: 437 Ohm
Lead Channel Pacing Threshold Amplitude: 0.5 V
Lead Channel Pacing Threshold Amplitude: 1.125 V
Lead Channel Pacing Threshold Pulse Width: 0.4 ms
Lead Channel Pacing Threshold Pulse Width: 0.4 ms
Lead Channel Sensing Intrinsic Amplitude: 3.125 mV
Lead Channel Sensing Intrinsic Amplitude: 3.125 mV
Lead Channel Sensing Intrinsic Amplitude: 7 mV
Lead Channel Sensing Intrinsic Amplitude: 7 mV
Lead Channel Setting Pacing Amplitude: 1.5 V
Lead Channel Setting Pacing Amplitude: 2 V
Lead Channel Setting Pacing Pulse Width: 1 ms
Lead Channel Setting Sensing Sensitivity: 2 mV
Zone Setting Status: 755011
Zone Setting Status: 755011

## 2023-09-25 ENCOUNTER — Ambulatory Visit: Attending: Internal Medicine | Admitting: *Deleted

## 2023-09-25 DIAGNOSIS — Z5181 Encounter for therapeutic drug level monitoring: Secondary | ICD-10-CM | POA: Diagnosis present

## 2023-09-25 DIAGNOSIS — I480 Paroxysmal atrial fibrillation: Secondary | ICD-10-CM | POA: Insufficient documentation

## 2023-09-25 LAB — POCT INR: INR: 3 (ref 2.0–3.0)

## 2023-09-25 NOTE — Patient Instructions (Signed)
 Description   Continue taking warfarin 1/2 tablet daily. Stay consistent with greens each week  Recheck INR in 6 weeks.  Call Coumadin  clinic with new medications [phone]

## 2023-09-25 NOTE — Progress Notes (Signed)
 INR-3.0; Please see anticoagulation encounter.

## 2023-11-13 ENCOUNTER — Ambulatory Visit

## 2023-11-15 ENCOUNTER — Ambulatory Visit: Attending: Internal Medicine | Admitting: *Deleted

## 2023-11-15 DIAGNOSIS — I480 Paroxysmal atrial fibrillation: Secondary | ICD-10-CM | POA: Diagnosis present

## 2023-11-15 DIAGNOSIS — Z5181 Encounter for therapeutic drug level monitoring: Secondary | ICD-10-CM | POA: Insufficient documentation

## 2023-11-15 LAB — POCT INR: INR: 2.5 (ref 2.0–3.0)

## 2023-11-15 NOTE — Progress Notes (Signed)
 Description   INR-2.5; Continue taking warfarin 1/2 tablet daily. Stay consistent with greens each week  Recheck INR in 6 weeks. Call Coumadin  Clinic with new medications [phone]

## 2023-11-15 NOTE — Patient Instructions (Signed)
 Description   INR-2.5; Continue taking warfarin 1/2 tablet daily. Stay consistent with greens each week  Recheck INR in 6 weeks. Call Coumadin  Clinic with new medications [phone]

## 2023-11-22 ENCOUNTER — Other Ambulatory Visit: Payer: Self-pay | Admitting: Internal Medicine

## 2023-11-22 ENCOUNTER — Other Ambulatory Visit: Payer: Self-pay | Admitting: Adult Health

## 2023-11-22 DIAGNOSIS — I480 Paroxysmal atrial fibrillation: Secondary | ICD-10-CM

## 2023-11-28 ENCOUNTER — Telehealth: Payer: Self-pay | Admitting: *Deleted

## 2023-11-28 NOTE — Telephone Encounter (Signed)
 We received a fax for refill on Keppra  500 mg however patient is overdue for 1 year follow up.    Can you call to see if he can get scheduled Thanks!

## 2023-11-29 NOTE — Telephone Encounter (Signed)
 Below noted.

## 2023-12-13 ENCOUNTER — Ambulatory Visit: Payer: Medicare Other

## 2023-12-13 DIAGNOSIS — I480 Paroxysmal atrial fibrillation: Secondary | ICD-10-CM

## 2023-12-14 LAB — CUP PACEART REMOTE DEVICE CHECK
Battery Remaining Longevity: 96 mo
Battery Voltage: 2.98 V
Brady Statistic AP VP Percent: 0.02 %
Brady Statistic AP VS Percent: 30.79 %
Brady Statistic AS VP Percent: 0.03 %
Brady Statistic AS VS Percent: 69.17 %
Brady Statistic RA Percent Paced: 30.3 %
Brady Statistic RV Percent Paced: 0.12 %
Date Time Interrogation Session: 20251008005835
Implantable Lead Connection Status: 753985
Implantable Lead Connection Status: 753985
Implantable Lead Implant Date: 20180410
Implantable Lead Implant Date: 20180410
Implantable Lead Location: 753859
Implantable Lead Location: 753860
Implantable Lead Model: 3830
Implantable Lead Model: 5076
Implantable Pulse Generator Implant Date: 20180410
Lead Channel Impedance Value: 304 Ohm
Lead Channel Impedance Value: 304 Ohm
Lead Channel Impedance Value: 418 Ohm
Lead Channel Impedance Value: 418 Ohm
Lead Channel Pacing Threshold Amplitude: 0.5 V
Lead Channel Pacing Threshold Amplitude: 1.125 V
Lead Channel Pacing Threshold Pulse Width: 0.4 ms
Lead Channel Pacing Threshold Pulse Width: 0.4 ms
Lead Channel Sensing Intrinsic Amplitude: 3 mV
Lead Channel Sensing Intrinsic Amplitude: 3 mV
Lead Channel Sensing Intrinsic Amplitude: 6.75 mV
Lead Channel Sensing Intrinsic Amplitude: 6.75 mV
Lead Channel Setting Pacing Amplitude: 1.5 V
Lead Channel Setting Pacing Amplitude: 2 V
Lead Channel Setting Pacing Pulse Width: 1 ms
Lead Channel Setting Sensing Sensitivity: 2 mV
Zone Setting Status: 755011
Zone Setting Status: 755011

## 2023-12-15 NOTE — Progress Notes (Signed)
 Remote PPM Transmission

## 2023-12-20 ENCOUNTER — Ambulatory Visit: Payer: Self-pay | Admitting: Internal Medicine

## 2023-12-27 ENCOUNTER — Ambulatory Visit: Attending: Internal Medicine | Admitting: *Deleted

## 2023-12-27 DIAGNOSIS — I480 Paroxysmal atrial fibrillation: Secondary | ICD-10-CM | POA: Diagnosis present

## 2023-12-27 DIAGNOSIS — Z5181 Encounter for therapeutic drug level monitoring: Secondary | ICD-10-CM | POA: Diagnosis present

## 2023-12-27 LAB — POCT INR: INR: 2.8 (ref 2.0–3.0)

## 2023-12-27 NOTE — Patient Instructions (Signed)
 Description   INR-2.8; Continue taking warfarin 1/2 tablet daily. Stay consistent with greens each week. Recheck INR in 7 weeks. Call Coumadin  Clinic with new medications [phone]

## 2023-12-27 NOTE — Progress Notes (Signed)
 Description   INR-2.8; Continue taking warfarin 1/2 tablet daily. Stay consistent with greens each week. Recheck INR in 7 weeks. Call Coumadin  Clinic with new medications [phone]

## 2023-12-28 ENCOUNTER — Other Ambulatory Visit: Payer: Self-pay | Admitting: Adult Health

## 2023-12-28 NOTE — Telephone Encounter (Signed)
 Patient not seen in office in >32yr.   Please contact to schedule. Must have appt for any further refills, or will need to contact PCP.  Thanks!

## 2024-01-09 ENCOUNTER — Ambulatory Visit (INDEPENDENT_AMBULATORY_CARE_PROVIDER_SITE_OTHER): Admitting: Adult Health

## 2024-01-09 ENCOUNTER — Encounter: Payer: Self-pay | Admitting: Adult Health

## 2024-01-09 VITALS — BP 138/77 | HR 62 | Ht 70.0 in | Wt 174.0 lb

## 2024-01-09 DIAGNOSIS — Z5181 Encounter for therapeutic drug level monitoring: Secondary | ICD-10-CM

## 2024-01-09 DIAGNOSIS — R569 Unspecified convulsions: Secondary | ICD-10-CM | POA: Diagnosis not present

## 2024-01-09 MED ORDER — LEVETIRACETAM 500 MG PO TABS: 500.0000 mg | ORAL_TABLET | Freq: Two times a day (BID) | ORAL | 4 refills | Status: AC

## 2024-01-09 NOTE — Patient Instructions (Addendum)
 Your Plan:  Continue Keppra  500 mg twice daily for seizure prevention  Please call with any recurrent seizure activity  We will check lab work today - you will be called with the results     Follow-up in 1 year or call earlier as needed      Thank you for coming to see us  at [facility]. I hope we have been able to provide you high quality care today.  You may receive a patient satisfaction survey over the next few weeks. We would appreciate your feedback and comments so that we may continue to improve ourselves and the health of our patients.
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
## 2024-01-13 LAB — COMPREHENSIVE METABOLIC PANEL WITH GFR
ALT: 6 IU/L (ref 0–44)
AST: 15 IU/L (ref 0–40)
Albumin: 3.8 g/dL (ref 3.8–4.8)
Alkaline Phosphatase: 132 IU/L — ABNORMAL HIGH (ref 47–123)
BUN/Creatinine Ratio: 18 (ref 10–24)
BUN: 17 mg/dL (ref 8–27)
Bilirubin Total: 0.7 mg/dL (ref 0.0–1.2)
CO2: 26 mmol/L (ref 20–29)
Calcium: 9 mg/dL (ref 8.6–10.2)
Chloride: 104 mmol/L (ref 96–106)
Creatinine, Ser: 0.92 mg/dL (ref 0.76–1.27)
Globulin, Total: 2.5 g/dL (ref 1.5–4.5)
Glucose: 82 mg/dL (ref 70–99)
Potassium: 5.1 mmol/L (ref 3.5–5.2)
Sodium: 140 mmol/L (ref 134–144)
Total Protein: 6.3 g/dL (ref 6.0–8.5)
eGFR: 88 mL/min/1.73 (ref >59)

## 2024-01-13 LAB — CBC WITH DIFFERENTIAL/PLATELET
Basophils Absolute: 0 x10E3/uL (ref 0.0–0.2)
Basos: 0 %
EOS (ABSOLUTE): 0.1 x10E3/uL (ref 0.0–0.4)
Eos: 1 %
Hematocrit: 43.4 % (ref 37.5–51.0)
Hemoglobin: 14.7 g/dL (ref 13.0–17.7)
Immature Grans (Abs): 0 x10E3/uL (ref 0.0–0.1)
Immature Granulocytes: 0 %
Lymphocytes Absolute: 1.1 x10E3/uL (ref 0.7–3.1)
Lymphs: 23 %
MCH: 34.6 pg — ABNORMAL HIGH (ref 26.6–33.0)
MCHC: 33.9 g/dL (ref 31.5–35.7)
MCV: 102 fL — ABNORMAL HIGH (ref 79–97)
Monocytes Absolute: 0.5 x10E3/uL (ref 0.1–0.9)
Monocytes: 10 %
Neutrophils Absolute: 3.2 x10E3/uL (ref 1.4–7.0)
Neutrophils: 66 %
Platelets: 168 x10E3/uL (ref 150–450)
RBC: 4.25 x10E6/uL (ref 4.14–5.80)
RDW: 11.7 % (ref 11.6–15.4)
WBC: 4.9 x10E3/uL (ref 3.4–10.8)

## 2024-01-13 LAB — LEVETIRACETAM LEVEL: Levetiracetam Lvl: 23.7 ug/mL (ref 10.0–40.0)

## 2024-01-15 ENCOUNTER — Ambulatory Visit: Payer: Self-pay | Admitting: Adult Health

## 2024-01-16 NOTE — Telephone Encounter (Signed)
 Reviewed results with patient, verbalized understanding. No further questions

## 2024-01-16 NOTE — Telephone Encounter (Signed)
-----   Message from Harlene Bogaert sent at 01/15/2024 10:39 AM EST ----- Please advise patient that his recent lab work was satisfactory.  Please continue current treatment plan. Thank you.   ----- Message ----- From: Rebecka Memos Lab Results In Sent: 01/10/2024   7:38 AM EST To: Harlene Bogaert, NP

## 2024-02-14 ENCOUNTER — Ambulatory Visit: Attending: Internal Medicine | Admitting: *Deleted

## 2024-02-14 DIAGNOSIS — Z5181 Encounter for therapeutic drug level monitoring: Secondary | ICD-10-CM | POA: Insufficient documentation

## 2024-02-14 DIAGNOSIS — I480 Paroxysmal atrial fibrillation: Secondary | ICD-10-CM | POA: Diagnosis present

## 2024-02-14 LAB — POCT INR: INR: 2.4 (ref 2.0–3.0)

## 2024-02-14 NOTE — Progress Notes (Signed)
 Description   INR-2.4; Continue taking warfarin 1/2 tablet daily. Stay consistent with greens each week. Recheck INR in 8 weeks. Call Coumadin  Clinic with new medications [phone]

## 2024-02-14 NOTE — Patient Instructions (Signed)
 Description   INR-2.4; Continue taking warfarin 1/2 tablet daily. Stay consistent with greens each week. Recheck INR in 8 weeks. Call Coumadin  Clinic with new medications [phone]

## 2024-02-26 ENCOUNTER — Other Ambulatory Visit: Payer: Self-pay | Admitting: Internal Medicine

## 2024-02-26 DIAGNOSIS — I480 Paroxysmal atrial fibrillation: Secondary | ICD-10-CM

## 2024-03-13 ENCOUNTER — Ambulatory Visit

## 2024-03-13 DIAGNOSIS — I495 Sick sinus syndrome: Secondary | ICD-10-CM

## 2024-03-14 LAB — CUP PACEART REMOTE DEVICE CHECK
Battery Remaining Longevity: 93 mo
Battery Voltage: 2.98 V
Brady Statistic AP VP Percent: 0.02 %
Brady Statistic AP VS Percent: 31.33 %
Brady Statistic AS VP Percent: 0.03 %
Brady Statistic AS VS Percent: 68.64 %
Brady Statistic RA Percent Paced: 30.23 %
Brady Statistic RV Percent Paced: 0.19 %
Date Time Interrogation Session: 20260107052316
Implantable Lead Connection Status: 753985
Implantable Lead Connection Status: 753985
Implantable Lead Implant Date: 20180410
Implantable Lead Implant Date: 20180410
Implantable Lead Location: 753859
Implantable Lead Location: 753860
Implantable Lead Model: 3830
Implantable Lead Model: 5076
Implantable Pulse Generator Implant Date: 20180410
Lead Channel Impedance Value: 342 Ohm
Lead Channel Impedance Value: 380 Ohm
Lead Channel Impedance Value: 494 Ohm
Lead Channel Impedance Value: 513 Ohm
Lead Channel Pacing Threshold Amplitude: 0.375 V
Lead Channel Pacing Threshold Amplitude: 1 V
Lead Channel Pacing Threshold Pulse Width: 0.4 ms
Lead Channel Pacing Threshold Pulse Width: 0.4 ms
Lead Channel Sensing Intrinsic Amplitude: 13.5 mV
Lead Channel Sensing Intrinsic Amplitude: 13.5 mV
Lead Channel Sensing Intrinsic Amplitude: 3.75 mV
Lead Channel Sensing Intrinsic Amplitude: 3.75 mV
Lead Channel Setting Pacing Amplitude: 1.5 V
Lead Channel Setting Pacing Amplitude: 2 V
Lead Channel Setting Pacing Pulse Width: 1 ms
Lead Channel Setting Sensing Sensitivity: 2 mV
Zone Setting Status: 755011
Zone Setting Status: 755011

## 2024-03-19 NOTE — Progress Notes (Signed)
 Remote PPM Transmission

## 2024-03-22 ENCOUNTER — Ambulatory Visit: Payer: Self-pay | Admitting: Cardiovascular Disease

## 2024-03-27 ENCOUNTER — Other Ambulatory Visit: Payer: Self-pay | Admitting: Internal Medicine

## 2024-03-27 DIAGNOSIS — I480 Paroxysmal atrial fibrillation: Secondary | ICD-10-CM

## 2024-03-27 NOTE — Telephone Encounter (Signed)
 Warfarin 5mg  Dx-Afib Last INR Check-02/14/24 Last OV- 10/19/22-needs an appt, will place a note on next INR appt

## 2024-04-10 ENCOUNTER — Ambulatory Visit

## 2024-04-22 ENCOUNTER — Ambulatory Visit

## 2024-06-12 ENCOUNTER — Encounter

## 2024-09-11 ENCOUNTER — Encounter

## 2024-12-11 ENCOUNTER — Encounter

## 2025-01-13 ENCOUNTER — Ambulatory Visit: Admitting: Neurology
